# Patient Record
Sex: Male | Born: 1950
Health system: Southern US, Community
[De-identification: ages and names within clinical notes are randomized; demographics above are authoritative.]

## PROBLEM LIST (undated history)

## (undated) DIAGNOSIS — N529 Male erectile dysfunction, unspecified: Secondary | ICD-10-CM

## (undated) DIAGNOSIS — C8590 Non-Hodgkin lymphoma, unspecified, unspecified site: Secondary | ICD-10-CM

## (undated) DIAGNOSIS — K219 Gastro-esophageal reflux disease without esophagitis: Secondary | ICD-10-CM

## (undated) DIAGNOSIS — D649 Anemia, unspecified: Secondary | ICD-10-CM

## (undated) DIAGNOSIS — E119 Type 2 diabetes mellitus without complications: Secondary | ICD-10-CM

## (undated) DIAGNOSIS — I10 Essential (primary) hypertension: Secondary | ICD-10-CM

## (undated) DIAGNOSIS — F329 Major depressive disorder, single episode, unspecified: Secondary | ICD-10-CM

## (undated) DIAGNOSIS — I5189 Other ill-defined heart diseases: Secondary | ICD-10-CM

## (undated) DIAGNOSIS — J449 Chronic obstructive pulmonary disease, unspecified: Secondary | ICD-10-CM

## (undated) DIAGNOSIS — J189 Pneumonia, unspecified organism: Secondary | ICD-10-CM

## (undated) DIAGNOSIS — E785 Hyperlipidemia, unspecified: Secondary | ICD-10-CM

## (undated) DIAGNOSIS — I498 Other specified cardiac arrhythmias: Secondary | ICD-10-CM

## (undated) DIAGNOSIS — Z9889 Other specified postprocedural states: Secondary | ICD-10-CM

## (undated) DIAGNOSIS — Z8669 Personal history of other diseases of the nervous system and sense organs: Secondary | ICD-10-CM

## (undated) DIAGNOSIS — K635 Polyp of colon: Secondary | ICD-10-CM

## (undated) DIAGNOSIS — F32A Depression, unspecified: Secondary | ICD-10-CM

## (undated) HISTORY — DX: Non-Hodgkin lymphoma, unspecified, unspecified site: C85.90

## (undated) HISTORY — DX: Male erectile dysfunction, unspecified: N52.9

## (undated) HISTORY — DX: Other ill-defined heart diseases: I51.89

## (undated) HISTORY — DX: Essential (primary) hypertension: I10

## (undated) HISTORY — DX: Gastro-esophageal reflux disease without esophagitis: K21.9

## (undated) HISTORY — PX: HERNIA REPAIR: SHX51

## (undated) HISTORY — DX: Hyperlipidemia, unspecified: E78.5

## (undated) HISTORY — DX: Depression, unspecified: F32.A

## (undated) HISTORY — DX: Chronic obstructive pulmonary disease, unspecified: J44.9

## (undated) HISTORY — DX: Personal history of other diseases of the nervous system and sense organs: Z86.69

## (undated) HISTORY — DX: Major depressive disorder, single episode, unspecified: F32.9

## (undated) HISTORY — PX: COLONOSCOPY W/ POLYPECTOMY: SHX1380

## (undated) HISTORY — DX: Type 2 diabetes mellitus without complications: E11.9

## (undated) HISTORY — PX: SHOULDER ARTHROSCOPY: SHX128

## (undated) HISTORY — DX: Other specified postprocedural states: Z98.890

## (undated) HISTORY — PX: CHOLECYSTECTOMY: SHX55

## (undated) HISTORY — DX: Polyp of colon: K63.5

---

## 1998-11-16 ENCOUNTER — Emergency Department (HOSPITAL_COMMUNITY): Admission: EM | Admit: 1998-11-16 | Discharge: 1998-11-16 | Payer: Self-pay | Admitting: Emergency Medicine

## 1998-11-16 ENCOUNTER — Encounter: Payer: Self-pay | Admitting: Emergency Medicine

## 1999-10-10 ENCOUNTER — Encounter: Payer: Self-pay | Admitting: Specialist

## 1999-10-10 ENCOUNTER — Ambulatory Visit (HOSPITAL_COMMUNITY): Admission: RE | Admit: 1999-10-10 | Discharge: 1999-10-10 | Payer: Self-pay | Admitting: Specialist

## 2000-01-25 ENCOUNTER — Encounter: Payer: Self-pay | Admitting: Emergency Medicine

## 2000-01-25 ENCOUNTER — Emergency Department (HOSPITAL_COMMUNITY): Admission: EM | Admit: 2000-01-25 | Discharge: 2000-01-25 | Payer: Self-pay | Admitting: Emergency Medicine

## 2001-03-03 ENCOUNTER — Encounter: Payer: Self-pay | Admitting: Emergency Medicine

## 2001-03-03 ENCOUNTER — Emergency Department (HOSPITAL_COMMUNITY): Admission: EM | Admit: 2001-03-03 | Discharge: 2001-03-03 | Payer: Self-pay | Admitting: Emergency Medicine

## 2007-05-06 ENCOUNTER — Ambulatory Visit: Payer: Self-pay | Admitting: *Deleted

## 2007-05-06 ENCOUNTER — Ambulatory Visit: Payer: Self-pay | Admitting: Internal Medicine

## 2007-05-06 LAB — CONVERTED CEMR LAB
CO2: 22 meq/L (ref 19–32)
Calcium: 8.8 mg/dL (ref 8.4–10.5)
Chloride: 98 meq/L (ref 96–112)
Cholesterol: 410 mg/dL — ABNORMAL HIGH (ref 0–200)
Eosinophils Relative: 1 % (ref 0–5)
Glucose, Bld: 355 mg/dL — ABNORMAL HIGH (ref 70–99)
HCT: 48.5 % (ref 39.0–52.0)
Hemoglobin: 16.1 g/dL (ref 13.0–17.0)
Lymphocytes Relative: 28 % (ref 12–46)
Lymphs Abs: 1.6 10*3/uL (ref 0.7–3.3)
Monocytes Absolute: 0.6 10*3/uL (ref 0.2–0.7)
Neutro Abs: 3.4 10*3/uL (ref 1.7–7.7)
Sodium: 135 meq/L (ref 135–145)
Total Bilirubin: 0.6 mg/dL (ref 0.3–1.2)
Total Protein: 7 g/dL (ref 6.0–8.3)
Triglycerides: 1091 mg/dL — ABNORMAL HIGH (ref <150)
WBC: 5.8 10*3/uL (ref 4.0–10.5)

## 2007-05-27 ENCOUNTER — Ambulatory Visit: Payer: Self-pay | Admitting: Internal Medicine

## 2007-06-13 ENCOUNTER — Ambulatory Visit: Payer: Self-pay | Admitting: Internal Medicine

## 2007-07-12 ENCOUNTER — Ambulatory Visit: Payer: Self-pay | Admitting: Internal Medicine

## 2007-07-12 LAB — CONVERTED CEMR LAB
ALT: 13 units/L (ref 0–53)
Albumin: 4.1 g/dL (ref 3.5–5.2)
CO2: 21 meq/L (ref 19–32)
Calcium: 8.9 mg/dL (ref 8.4–10.5)
Chloride: 104 meq/L (ref 96–112)
Cholesterol: 287 mg/dL — ABNORMAL HIGH (ref 0–200)
Glucose, Bld: 189 mg/dL — ABNORMAL HIGH (ref 70–99)
Potassium: 4.4 meq/L (ref 3.5–5.3)
Sodium: 139 meq/L (ref 135–145)
Total Bilirubin: 0.4 mg/dL (ref 0.3–1.2)
Total Protein: 7.3 g/dL (ref 6.0–8.3)
Triglycerides: 524 mg/dL — ABNORMAL HIGH (ref <150)

## 2007-07-18 ENCOUNTER — Ambulatory Visit: Payer: Self-pay | Admitting: Internal Medicine

## 2007-10-18 ENCOUNTER — Encounter (INDEPENDENT_AMBULATORY_CARE_PROVIDER_SITE_OTHER): Payer: Self-pay | Admitting: Family Medicine

## 2007-10-18 ENCOUNTER — Ambulatory Visit: Payer: Self-pay | Admitting: Family Medicine

## 2007-10-18 LAB — CONVERTED CEMR LAB
Albumin: 4.3 g/dL (ref 3.5–5.2)
BUN: 18 mg/dL (ref 6–23)
CO2: 22 meq/L (ref 19–32)
Calcium: 8.9 mg/dL (ref 8.4–10.5)
Chloride: 102 meq/L (ref 96–112)
Cholesterol: 232 mg/dL — ABNORMAL HIGH (ref 0–200)
Creatinine, Ser: 0.99 mg/dL (ref 0.40–1.50)
Glucose, Bld: 257 mg/dL — ABNORMAL HIGH (ref 70–99)
HDL: 31 mg/dL — ABNORMAL LOW (ref >39)
Potassium: 4.8 meq/L (ref 3.5–5.3)
Total CHOL/HDL Ratio: 7.5

## 2008-04-06 ENCOUNTER — Ambulatory Visit: Payer: Self-pay | Admitting: Internal Medicine

## 2008-04-18 ENCOUNTER — Ambulatory Visit: Payer: Self-pay | Admitting: Family Medicine

## 2008-07-04 ENCOUNTER — Ambulatory Visit: Payer: Self-pay | Admitting: Family Medicine

## 2008-07-04 LAB — CONVERTED CEMR LAB
AST: 15 units/L (ref 0–37)
Alkaline Phosphatase: 81 units/L (ref 39–117)
BUN: 17 mg/dL (ref 6–23)
Basophils Relative: 0 % (ref 0–1)
Eosinophils Absolute: 0.4 10*3/uL (ref 0.0–0.7)
Eosinophils Relative: 4 % (ref 0–5)
Glucose, Bld: 190 mg/dL — ABNORMAL HIGH (ref 70–99)
HCT: 48.7 % (ref 39.0–52.0)
HDL: 37 mg/dL — ABNORMAL LOW (ref >39)
LDL Cholesterol: 130 mg/dL — ABNORMAL HIGH (ref 0–99)
Lymphs Abs: 2.7 10*3/uL (ref 0.7–4.0)
MCHC: 32.4 g/dL (ref 30.0–36.0)
MCV: 91.5 fL (ref 78.0–100.0)
Monocytes Relative: 11 % (ref 3–12)
Neutrophils Relative %: 59 % (ref 43–77)
Platelets: 322 10*3/uL (ref 150–400)
Potassium: 4.7 meq/L (ref 3.5–5.3)
Sodium: 139 meq/L (ref 135–145)
TSH: 2.26 microintl units/mL (ref 0.350–4.50)
Total Bilirubin: 0.8 mg/dL (ref 0.3–1.2)
Total CHOL/HDL Ratio: 6
Triglycerides: 272 mg/dL — ABNORMAL HIGH (ref <150)
VLDL: 54 mg/dL — ABNORMAL HIGH (ref 0–40)
WBC: 10.4 10*3/uL (ref 4.0–10.5)

## 2008-07-16 ENCOUNTER — Ambulatory Visit: Payer: Self-pay | Admitting: Internal Medicine

## 2008-07-18 ENCOUNTER — Ambulatory Visit: Payer: Self-pay | Admitting: Internal Medicine

## 2008-09-25 ENCOUNTER — Emergency Department (HOSPITAL_COMMUNITY): Admission: EM | Admit: 2008-09-25 | Discharge: 2008-09-25 | Payer: Self-pay | Admitting: Family Medicine

## 2008-10-15 ENCOUNTER — Ambulatory Visit: Payer: Self-pay | Admitting: Internal Medicine

## 2008-12-10 ENCOUNTER — Ambulatory Visit: Payer: Self-pay | Admitting: Internal Medicine

## 2008-12-17 ENCOUNTER — Ambulatory Visit: Payer: Self-pay | Admitting: Internal Medicine

## 2008-12-28 ENCOUNTER — Ambulatory Visit: Payer: Self-pay | Admitting: Family Medicine

## 2009-02-26 ENCOUNTER — Ambulatory Visit: Payer: Self-pay | Admitting: Family Medicine

## 2009-08-22 ENCOUNTER — Ambulatory Visit: Payer: Self-pay | Admitting: Internal Medicine

## 2009-08-22 LAB — CONVERTED CEMR LAB
AST: 19 units/L (ref 0–37)
Alkaline Phosphatase: 115 units/L (ref 39–117)
Basophils Absolute: 0 10*3/uL (ref 0.0–0.1)
Basophils Relative: 1 % (ref 0–1)
Glucose, Bld: 268 mg/dL — ABNORMAL HIGH (ref 70–99)
Lymphocytes Relative: 22 % (ref 12–46)
MCHC: 33.6 g/dL (ref 30.0–36.0)
Monocytes Absolute: 0.8 10*3/uL (ref 0.1–1.0)
Neutro Abs: 5.1 10*3/uL (ref 1.7–7.7)
Neutrophils Relative %: 65 % (ref 43–77)
Platelets: 287 10*3/uL (ref 150–400)
Pro B Natriuretic peptide (BNP): 9.7 pg/mL (ref 0.0–100.0)
RDW: 12.9 % (ref 11.5–15.5)
Sodium: 139 meq/L (ref 135–145)
Total Bilirubin: 0.4 mg/dL (ref 0.3–1.2)
Total Protein: 6.8 g/dL (ref 6.0–8.3)

## 2009-10-04 ENCOUNTER — Ambulatory Visit (HOSPITAL_COMMUNITY): Admission: RE | Admit: 2009-10-04 | Discharge: 2009-10-04 | Payer: Self-pay | Admitting: Family Medicine

## 2009-12-30 ENCOUNTER — Emergency Department (HOSPITAL_COMMUNITY): Admission: EM | Admit: 2009-12-30 | Discharge: 2009-12-30 | Payer: Self-pay | Admitting: Emergency Medicine

## 2010-03-21 ENCOUNTER — Ambulatory Visit: Payer: Self-pay | Admitting: Family Medicine

## 2013-01-13 DIAGNOSIS — Z9889 Other specified postprocedural states: Secondary | ICD-10-CM

## 2013-01-13 HISTORY — DX: Other specified postprocedural states: Z98.890

## 2013-01-17 ENCOUNTER — Encounter (INDEPENDENT_AMBULATORY_CARE_PROVIDER_SITE_OTHER): Payer: Self-pay | Admitting: Surgery

## 2013-01-18 ENCOUNTER — Encounter (INDEPENDENT_AMBULATORY_CARE_PROVIDER_SITE_OTHER): Payer: Self-pay | Admitting: Surgery

## 2013-01-18 ENCOUNTER — Telehealth: Payer: Self-pay | Admitting: Oncology

## 2013-01-18 ENCOUNTER — Ambulatory Visit (INDEPENDENT_AMBULATORY_CARE_PROVIDER_SITE_OTHER): Payer: Self-pay | Admitting: Surgery

## 2013-01-18 VITALS — BP 108/64 | HR 80 | Temp 97.6°F | Resp 18 | Ht 73.0 in | Wt 259.4 lb

## 2013-01-18 DIAGNOSIS — R161 Splenomegaly, not elsewhere classified: Secondary | ICD-10-CM | POA: Insufficient documentation

## 2013-01-18 NOTE — Progress Notes (Signed)
Patient ID: Micheal Thompson, male   DOB: [birth year], 62 y.o.   MRN: [id number]  Chief Complaint  Patient presents with  . New Evaluation    new pt- eval splenomegaly possible lymphoma    HPI Micheal Thompson is a 62 y.o. male.  Referred by Dr. 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  He denies any constipation but does report some early satiety. He reports a lot of sweating, especially at night, but this does not appear to be any new symptom for the patient. He denies any easy bruising or unexplained weight loss. He was evaluated by Dr. Sherrye Payor who was concerned about a large palpable mass in the left side of his abdomen. He underwent a CT scan which is remarkable for massive splenomegaly as well as multiple enlarged intra-abdominal lymph nodes. These are consistent with probable lymphoma. He was referred to our office today for further evaluation. He has also been referred to oncology for consultation.  Past Medical History  Diagnosis Date  . H/O colonoscopy 01/13/2013  . Hyperlipidemia   . Diabetes mellitus without complication   . Hypertension   . Depression   . H/O Bell's palsy   . Pancarditis   . ED (erectile dysfunction)   . Colon polyps   . COPD (chronic obstructive pulmonary disease)   . GERD (gastroesophageal reflux disease)     Past Surgical History  Procedure Laterality Date  . Hernia repair    . Shoulder arthroscopy    . Cholecystectomy    Open chole in 1983.   Family History  Problem Relation Age of Onset  . Cancer Maternal Grandmother     unknown    Social History History  Substance Use Topics  . Smoking status: Former Smoker    Quit date: 08/11/1983  . Smokeless tobacco: Not on file  . Alcohol Use: No    No Known Allergies  Current  Outpatient Prescriptions  Medication Sig Dispense Refill  . insulin regular (NOVOLIN R,HUMULIN R) 100 units/mL injection Inject 40 Units into the skin 3 (three) times daily before meals.       No current facility-administered medications for this visit.    Review of Systems Review of Systems  Constitutional: Positive for fatigue. Negative for fever, chills and unexpected weight change.  HENT: Negative for hearing loss, congestion, sore throat, trouble swallowing and voice change.   Eyes: Negative for visual disturbance.  Respiratory: Negative for cough and wheezing.   Cardiovascular: Negative for chest pain, palpitations and leg swelling.  Gastrointestinal: Positive for abdominal pain and abdominal distention. Negative for nausea, vomiting, diarrhea, constipation, blood in stool, anal bleeding and rectal pain.  Genitourinary: Positive for flank pain. Negative for hematuria and difficulty urinating.  Musculoskeletal: Negative for arthralgias.  Skin: Negative for rash and wound.  Neurological: Negative for seizures, syncope, weakness and headaches.  Hematological: Negative for adenopathy. Does not bruise/bleed easily.  Psychiatric/Behavioral: Negative for confusion.    Blood pressure 108/64, pulse 80, temperature 97.6 F (36.4 C), temperature source Temporal, resp. rate 18, height 6\' 1"  (1.854 m), weight 259 lb 6.4 oz (117.663 kg).  Physical Exam Physical Exam WDWN in NAD HEENT:  EOMI, sclera anicteric Neck:  No masses, no thyromegaly Lungs:  CTA bilaterally; normal respiratory effort CV:  Regular rate  and rhythm; no murmurs Abd:  +bowel sounds, soft, visible mass in left abdomen; palpable spleen extending down almost to the pelvis No palpable inguinal lymphadenopathy Ext:  Well-perfused; no edema Skin:  Warm, dry; no sign of jaundice  Data Reviewed CT scan Abd/Pelvis with contrast - scanned into EPIC Massive splenomegaly 22x 11.5 x 27 CM.  Normal splenic density.  Numerous  upper abdominal lymph nodes around the GE junction, paragastric, precaval, and porta hepatis regions.  The largest precaval node measures 5.5 x 2.7 cm.  Right paramedian supraumbilical ventral hernia  Amylase 48 Lipase 53 Electrolytes WNL WBC 13.5 Hgb 12.6 Plt 160 LFT's WNL x Alk Phos 277  Assessment    Massive splenomegaly with multiple enlarged intra-abdominal lymph nodes - certainly highly suspicious for malignancy, such as lymphoma or metastatic disease from unknown primary.       Plan    Would recommend obtaining a tissue diagnosis to allow formation of a treatment plan.  Will discuss with interventional radiology regarding the possibility of a core biopsy of one of these enlarged intra-abdominal lymph nodes.  May also require CT chest to evaluate for mediastinal lymphadenopathy.  It is possible that the splenomegaly will improve when his primary malignancy is adequately treated.  We will contact the patient after we have discussed with IR.  He is scheduled to consult with Oncology.         Monesha Monreal K. 01/18/2013, 6:17 PM

## 2013-01-18 NOTE — Telephone Encounter (Signed)
S/W PT IN RE IN NP APPT 06/12 @ 1:30 W.DR.SHADAD REFERRING DR. Waynard Edwards DX- LARGE MASS PALPITATED  IN LU Q SEE CT WELCOME PACKET MAILED.

## 2013-01-18 NOTE — Telephone Encounter (Signed)
C/D 01/18/13 for appt. 01/19/13

## 2013-01-19 ENCOUNTER — Telehealth: Payer: Self-pay | Admitting: Oncology

## 2013-01-19 ENCOUNTER — Other Ambulatory Visit: Payer: Self-pay | Admitting: Lab

## 2013-01-19 ENCOUNTER — Ambulatory Visit: Payer: Self-pay | Admitting: Oncology

## 2013-01-19 ENCOUNTER — Other Ambulatory Visit: Payer: Self-pay | Admitting: Oncology

## 2013-01-19 ENCOUNTER — Ambulatory Visit: Payer: Self-pay

## 2013-01-19 DIAGNOSIS — R161 Splenomegaly, not elsewhere classified: Secondary | ICD-10-CM

## 2013-01-19 NOTE — Telephone Encounter (Signed)
Pt given appt today by Garrel Ridgel

## 2013-01-23 ENCOUNTER — Other Ambulatory Visit (INDEPENDENT_AMBULATORY_CARE_PROVIDER_SITE_OTHER): Payer: Self-pay | Admitting: Surgery

## 2013-01-23 DIAGNOSIS — R161 Splenomegaly, not elsewhere classified: Secondary | ICD-10-CM

## 2013-01-24 ENCOUNTER — Telehealth (INDEPENDENT_AMBULATORY_CARE_PROVIDER_SITE_OTHER): Payer: Self-pay | Admitting: General Surgery

## 2013-01-24 NOTE — Telephone Encounter (Signed)
Left message on machine for patient to call back and ask for me. To make him aware CT chest set up at [facility] [address] location for Friday 01/27/2013 arrive at 8:15 am for 8:30 am appt. To make him aware we are doing CT Chest first to evaluate for a lymph node to biopsy. Awaiting patient call back to make him aware.

## 2013-01-24 NOTE — Telephone Encounter (Signed)
Spoke with patient and he will not have insurance until February 07, 2013. He does not want to pay out of pocket for any more tests. I gave him the information to [facility] to call to r/s his CT chest. I explained the need for the CT chest prior to his biopsy. He stated he did not want to see the oncologist until after his biopsy result because he doesn't want to waste money. He will call [facility] to cancel this appt for now. He will also call [facility] vs [facility] - he may call back for [place] to send order to [facility] if they are cheaper. He will contact us and let us know what he needs.

## 2013-01-24 NOTE — Addendum Note (Signed)
Addended byLiliana Cline on: 01/24/2013 09:27 AM   Modules accepted: Orders

## 2013-01-27 ENCOUNTER — Other Ambulatory Visit: Payer: Self-pay

## 2013-02-02 ENCOUNTER — Encounter (INDEPENDENT_AMBULATORY_CARE_PROVIDER_SITE_OTHER): Payer: Self-pay

## 2013-02-07 ENCOUNTER — Ambulatory Visit (HOSPITAL_BASED_OUTPATIENT_CLINIC_OR_DEPARTMENT_OTHER): Payer: BC Managed Care – PPO | Admitting: Oncology

## 2013-02-07 ENCOUNTER — Other Ambulatory Visit (HOSPITAL_COMMUNITY)
Admission: RE | Admit: 2013-02-07 | Discharge: 2013-02-07 | Disposition: A | Discharge status: Home / Self Care | Payer: BC Managed Care – PPO | Source: Ambulatory Visit | Attending: Oncology | Admitting: Oncology

## 2013-02-07 ENCOUNTER — Other Ambulatory Visit (HOSPITAL_BASED_OUTPATIENT_CLINIC_OR_DEPARTMENT_OTHER): Payer: BC Managed Care – PPO | Admitting: Lab

## 2013-02-07 ENCOUNTER — Encounter: Payer: Self-pay | Admitting: Oncology

## 2013-02-07 ENCOUNTER — Other Ambulatory Visit: Payer: Self-pay | Admitting: Lab

## 2013-02-07 ENCOUNTER — Ambulatory Visit: Payer: Self-pay

## 2013-02-07 ENCOUNTER — Telehealth: Payer: Self-pay | Admitting: Oncology

## 2013-02-07 VITALS — BP 137/66 | HR 83 | Temp 98.1°F | Resp 18 | Ht 73.0 in | Wt 264.0 lb

## 2013-02-07 DIAGNOSIS — R6889 Other general symptoms and signs: Secondary | ICD-10-CM | POA: Insufficient documentation

## 2013-02-07 DIAGNOSIS — R161 Splenomegaly, not elsewhere classified: Secondary | ICD-10-CM

## 2013-02-07 DIAGNOSIS — D72820 Lymphocytosis (symptomatic): Secondary | ICD-10-CM

## 2013-02-07 LAB — COMPREHENSIVE METABOLIC PANEL (CC13)
Albumin: 3.3 g/dL — ABNORMAL LOW (ref 3.5–5.0)
CO2: 22 mEq/L (ref 22–29)
Calcium: 9.1 mg/dL (ref 8.4–10.4)
Glucose: 317 mg/dl — ABNORMAL HIGH (ref 70–140)
Potassium: 4.8 mEq/L (ref 3.5–5.1)
Sodium: 137 mEq/L (ref 136–145)
Total Protein: 7.1 g/dL (ref 6.4–8.3)

## 2013-02-07 LAB — CBC WITH DIFFERENTIAL/PLATELET
Eosinophils Absolute: 0.1 10*3/uL (ref 0.0–0.5)
HGB: 12.8 g/dL — ABNORMAL LOW (ref 13.0–17.1)
LYMPH%: 74.6 % — ABNORMAL HIGH (ref 14.0–49.0)
MONO#: 0.5 10*3/uL (ref 0.1–0.9)
NEUT#: 3.7 10*3/uL (ref 1.5–6.5)
Platelets: 119 10*3/uL — ABNORMAL LOW (ref 140–400)
RBC: 4.25 10*6/uL (ref 4.20–5.82)
RDW: 14.5 % (ref 11.0–14.6)
WBC: 16.8 10*3/uL — ABNORMAL HIGH (ref 4.0–10.3)

## 2013-02-07 LAB — CHCC SMEAR

## 2013-02-07 LAB — TECHNOLOGIST REVIEW

## 2013-02-07 NOTE — Telephone Encounter (Signed)
gv and printed appt sched and avs for pt  °

## 2013-02-07 NOTE — Progress Notes (Signed)
Reason for Referral: Splenomegaly   HPI: Micheal Thompson is a pleasant 62 year old gentleman currently of [place] referred to me for the evaluation of splenomegaly. He is a gentleman with past medical history significant for diabetes, obesity and hyperlipidemia who had noted a chronic right-sided abdominal pain for the last year and a half or so. He attributed that to to his profession as he works as a Pensions consultant and trach patient's and his job is rather physical requires a lot of lifting and moving of heavy equipment and patients. Most recently, he was noticed increased pain in his right side of the abdomen as well as a palpable mass. He got to that to the attention of his primary care doctor ordered a CT scan of the abdomen and pelvis on 01/16/2013. His CT scan showed massive splenomegaly measuring 22 x 11.5 X 27 cm . He also noted extensive upper abdominal lymphadenopathy most noticeably in the precaval region with a 5.5 x 2.7 cm size. Patient evaluated by Dr. Margaree Thompson from Gen. surgery who referred the patient for oncological evaluation. Clinically, Micheal Thompson report symptoms of fatigue, tiredness and some occasional exertional dyspnea. He does report to chronic nagging right upper quadrant pain but did not report any other constitutional symptoms. He had not reported any fevers, chills, night sweats or and did report decrease in his appetite slight weight loss. He stopped working as of late but still able to drive and performance activity of daily living without any hindrance or decline.   Past Medical History  Diagnosis Date  . H/O colonoscopy 01/13/2013  . Hyperlipidemia   . Diabetes mellitus without complication   . Hypertension   . Depression   . H/O Bell's palsy   . Pancarditis   . ED (erectile dysfunction)   . Colon polyps   . COPD (chronic obstructive pulmonary disease)   . GERD (gastroesophageal reflux disease)   :  Past Surgical History  Procedure Laterality Date   . Hernia repair    . Shoulder arthroscopy    . Cholecystectomy    :  Current Outpatient Prescriptions  Medication Sig Dispense Refill  . insulin NPH (HUMULIN N,NOVOLIN N) 100 UNIT/ML injection Inject 40 Units into the skin 2 (two) times daily at 8 am and 10 pm.      . insulin regular (NOVOLIN R,HUMULIN R) 100 units/mL injection Inject into the skin 3 (three) times daily before meals.        No current facility-administered medications for this visit.   : :  No Known Allergies:  Family History  Problem Relation Age of Onset  . Cancer Maternal Grandmother     unknown  :  History   Social History  . Marital Status: Married    Spouse Name: N/A    Number of Children: N/A  . Years of Education: N/A   Occupational History  . Not on file.   Social History Main Topics  . Smoking status: Former Smoker    Quit date: 08/11/1983  . Smokeless tobacco: Not on file  . Alcohol Use: No  . Drug Use: No  . Sexually Active: Not on file   Other Topics Concern  . Not on file   Social History Narrative  . No narrative on file  :  A comprehensive review of systems was negative.  Exam: ECOG 1 Blood pressure 137/66, pulse 83, temperature 98.1 F (36.7 C), temperature source Oral, resp. rate 18, height 6\' 1"  (1.854 m), weight 264 lb (119.75 kg).  General appearance: alert, cooperative and appears stated age Head: Normocephalic, without obvious abnormality, atraumatic Throat: lips, mucosa, and tongue normal; teeth and gums normal Neck: no adenopathy, no carotid bruit, no JVD, supple, symmetrical, trachea midline and thyroid not enlarged, symmetric, no tenderness/mass/nodules Back: symmetric, no curvature. ROM normal. No CVA tenderness. Resp: clear to auscultation bilaterally Chest wall: no tenderness Cardio: regular rate and rhythm, S1, S2 normal, no murmur, click, rub or gallop GI: abnormal findings:  Splenomegaly noted.  Extremities: extremities normal, atraumatic, no cyanosis or  edema Pulses: 2+ and symmetric Skin: Skin color, texture, turgor normal. No rashes or lesions Lymph nodes: Cervical, supraclavicular, and axillary nodes normal.   Recent Labs  02/07/13 1042  WBC 16.8*  HGB 12.8*  HCT 38.4  PLT 119*    Recent Labs  02/07/13 1042  NA 137  K 4.8  CO2 22  GLUCOSE 317 Repeated and Verified*  BUN 21.4  CREATININE 1.1  CALCIUM 9.1     Blood smear review: Peripheral smear was personally reviewed today and showed evidence of increased lymphocytes that were clearly abnormal appearing. There appeared enlarged and some have cytoplasmic projections. Slightly decreased platelets but otherwise normal in appearance the red cells appeared normal as well.    Assessment and Plan:   62 year old gentleman with a recent finding of massive splenomegaly, abdominal adenopathy and lymphocytosis on his blood work. The differential diagnosis was discussed today with Micheal Thompson and undoubtedly he has a lymphoproliferative disorder most likely low-grade non-Hodgkin's lymphoma. These conditions would include lymphoma such as splenic marginal zone lymphoma, hairy cell leukemia and less likely small lymphocytic leukemia. To work this up completely, he is scheduled to have a CT scan of the chest to identify any further lymphadenopathy but ultimately he will need a biopsy. I do agree that biopsying the spleen is a rather risky and unnecessary procedure at this time as well as biopsying his and abdominal adenopathy my also be difficult. Given the fact that he will require a bone marrow biopsy in any case for staging, up that we started with that and hopefully that will give [place] the diagnosis and we'll spare him any further biopsies. I will also send his blood for flow cytometry and I hope that will also aid in making the diagnosis. From a management standpoint, once we have identified a type of lymphoma will initiate treatment as soon as possible and that should help with the symptoms  including shrinking his lymphadenopathy as well as his splenomegaly. I probably will help with his fatigue and tiredness as well. He understands the treatment will probably include multiagent systemic chemotherapy but the exact kind and schedule will be determined but his type of multiple I will schedule him adequate followup after the results of his flow cytometry and bone marrow biopsy have been concluded. All his questions were answered today.

## 2013-02-07 NOTE — Progress Notes (Signed)
Checked in new pt with no financial concerns. °

## 2013-02-09 ENCOUNTER — Encounter (HOSPITAL_COMMUNITY): Payer: Self-pay | Admitting: Pharmacy Technician

## 2013-02-09 ENCOUNTER — Other Ambulatory Visit: Payer: Self-pay | Admitting: Radiology

## 2013-02-15 ENCOUNTER — Ambulatory Visit (HOSPITAL_COMMUNITY)
Admission: RE | Admit: 2013-02-15 | Discharge: 2013-02-15 | Disposition: A | Discharge status: Home / Self Care | Payer: BC Managed Care – PPO | Source: Ambulatory Visit | Attending: Oncology | Admitting: Oncology

## 2013-02-15 ENCOUNTER — Encounter (HOSPITAL_COMMUNITY): Payer: Self-pay

## 2013-02-15 DIAGNOSIS — R161 Splenomegaly, not elsewhere classified: Secondary | ICD-10-CM

## 2013-02-15 DIAGNOSIS — E785 Hyperlipidemia, unspecified: Secondary | ICD-10-CM | POA: Insufficient documentation

## 2013-02-15 DIAGNOSIS — K219 Gastro-esophageal reflux disease without esophagitis: Secondary | ICD-10-CM | POA: Insufficient documentation

## 2013-02-15 DIAGNOSIS — D649 Anemia, unspecified: Secondary | ICD-10-CM | POA: Insufficient documentation

## 2013-02-15 DIAGNOSIS — C8589 Other specified types of non-Hodgkin lymphoma, extranodal and solid organ sites: Secondary | ICD-10-CM | POA: Insufficient documentation

## 2013-02-15 DIAGNOSIS — J449 Chronic obstructive pulmonary disease, unspecified: Secondary | ICD-10-CM | POA: Insufficient documentation

## 2013-02-15 DIAGNOSIS — E119 Type 2 diabetes mellitus without complications: Secondary | ICD-10-CM | POA: Insufficient documentation

## 2013-02-15 DIAGNOSIS — I10 Essential (primary) hypertension: Secondary | ICD-10-CM | POA: Insufficient documentation

## 2013-02-15 DIAGNOSIS — Z79899 Other long term (current) drug therapy: Secondary | ICD-10-CM | POA: Insufficient documentation

## 2013-02-15 DIAGNOSIS — J4489 Other specified chronic obstructive pulmonary disease: Secondary | ICD-10-CM | POA: Insufficient documentation

## 2013-02-15 DIAGNOSIS — D72829 Elevated white blood cell count, unspecified: Secondary | ICD-10-CM | POA: Insufficient documentation

## 2013-02-15 DIAGNOSIS — D72820 Lymphocytosis (symptomatic): Secondary | ICD-10-CM | POA: Insufficient documentation

## 2013-02-15 LAB — CBC
MCHC: 32.8 g/dL (ref 30.0–36.0)
Platelets: 192 10*3/uL (ref 150–400)
RDW: 13.3 % (ref 11.5–15.5)
WBC: 33.1 10*3/uL — ABNORMAL HIGH (ref 4.0–10.5)

## 2013-02-15 LAB — APTT: aPTT: 28 seconds (ref 24–37)

## 2013-02-15 LAB — PROTIME-INR
INR: 1.03 (ref 0.00–1.49)
Prothrombin Time: 13.3 seconds (ref 11.6–15.2)

## 2013-02-15 LAB — GLUCOSE, CAPILLARY: Glucose-Capillary: 252 mg/dL — ABNORMAL HIGH (ref 70–99)

## 2013-02-15 MED ORDER — INSULIN ASPART 100 UNIT/ML ~~LOC~~ SOLN
0.0000 [IU] | Freq: Three times a day (TID) | SUBCUTANEOUS | Status: DC
  Administered 2013-02-15: 5 [IU] via SUBCUTANEOUS
  Filled 2013-02-15: qty 1

## 2013-02-15 MED ORDER — SODIUM CHLORIDE 0.9 % IV SOLN
Freq: Once | INTRAVENOUS | Status: AC
  Administered 2013-02-15: 20 mL/h via INTRAVENOUS

## 2013-02-15 MED ORDER — FENTANYL CITRATE 0.05 MG/ML IJ SOLN
INTRAMUSCULAR | Status: AC
  Filled 2013-02-15: qty 4

## 2013-02-15 MED ORDER — MIDAZOLAM HCL 2 MG/2ML IJ SOLN
INTRAMUSCULAR | Status: AC
  Filled 2013-02-15: qty 4

## 2013-02-15 MED ORDER — MIDAZOLAM HCL 2 MG/2ML IJ SOLN
INTRAMUSCULAR | Status: AC | PRN
  Administered 2013-02-15 (×2): 2 mg via INTRAVENOUS

## 2013-02-15 MED ORDER — FENTANYL CITRATE 0.05 MG/ML IJ SOLN
INTRAMUSCULAR | Status: AC | PRN
  Administered 2013-02-15: 100 ug via INTRAVENOUS

## 2013-02-15 NOTE — H&P (Signed)
Chief Complaint: "I'm here for bone marrow biopsy" Referring Physician:Shadad HPI: Micheal Thompson is an 62 y.o. male with splenomegaly and lymphomcytosis. He is undergoing workup and is scheduled for bone marrow biopsy. They are hoping to avoid any intervention/biopsy of the spleen. He has been having sweats/low grade fevers for the past few weeks. PMHx and meds reviewed.  Past Medical History:  Past Medical History  Diagnosis Date  . H/O colonoscopy 01/13/2013  . Hyperlipidemia   . Diabetes mellitus without complication   . Hypertension   . Depression   . H/O Bell's palsy   . Pancarditis   . ED (erectile dysfunction)   . Colon polyps   . COPD (chronic obstructive pulmonary disease)   . GERD (gastroesophageal reflux disease)     Past Surgical History:  Past Surgical History  Procedure Laterality Date  . Hernia repair    . Shoulder arthroscopy    . Cholecystectomy      Family History:  Family History  Problem Relation Age of Onset  . Cancer Maternal Grandmother     unknown    Social History:  reports that he quit smoking about 29 years ago. He does not have any smokeless tobacco history on file. He reports that he does not drink alcohol or use illicit drugs.  Allergies:  Allergies  Allergen Reactions  . Penicillins Other (See Comments)    unknown    Medications: docusate sodium (COLACE) 100 MG capsule (Taking) Sig - Route: Take 100 mg by mouth daily. - Oral Class: Historical Med HYDROcodone-acetaminophen (NORCO/VICODIN) 5-325 MG per tablet (Taking) Sig - Route: Take 1 tablet by mouth every 6 (six) hours as needed for pain. - Oral Class: Historical Med insulin NPH (HUMULIN N,NOVOLIN N) 100 UNIT/ML injection (Taking) Sig - Route: Inject 40 Units into the skin 2 (two) times daily at 8 am and 10 pm. - Subcutaneous Class: Historical Med Number of times this order has been changed since signing: 1 Order Audit Trail insulin regular (NOVOLIN R,HUMULIN R) 100 units/mL injection  (Taking) Sig - Route: Inject 5-30 Units into the skin 3 (three) times daily before meals. Sliding scale 81-120= 5 units, 121-140= 7 units, 141-180-=10 units, 181-240=12 units, 241-280=15 unites, 281-340=22 units, 341-400=25 units, >400=30 units - Subcutaneous Class: Historical Med Number of times this order has been changed since signing: 3 Order Audit Trail Multiple Vitamin (MULTIVITAMIN WITH MINERALS) TABS (Taking) Sig - Route: Take 1 tablet by mouth daily. - Oral Class: Historical Med polyethylene glycol (MIRALAX / GLYCOLAX) packet (Taking) Sig - Route: Take 17 g by mouth daily. - Oral Class: Historical Med sertraline (ZOLOFT) 50 MG tablet (Taking) Sig - Route: Take 12.5-50 mg by mouth daily at 12 noon. Pt takes a 12.5 mg for 4 days, 25 mg for 4 days, then 50 from there - Oral    Please HPI for pertinent positives, otherwise complete 10 system ROS negative.  Physical Exam: Temp: 98.2, HR: 104, RR: 18, BP: 141/75   General Appearance:  Alert, cooperative, no distress, appears stated age  Head:  Normocephalic, without obvious abnormality, atraumatic  ENT: Unremarkable  Neck: Supple, symmetrical, trachea midline  Lungs:   Clear to auscultation bilaterally, no w/r/r, respirations unlabored without use of accessory muscles.  Chest Wall:  No tenderness or deformity  Heart:  Regular rate and rhythm, S1, S2 normal, no murmur, rub or gallop.  Neurologic: Normal affect, no gross deficits.   Results for orders placed during the hospital encounter of 02/15/13 (from the past 48  hour(s))  APTT     Status: None   Collection Time    02/15/13  7:00 AM      Result Value Range   aPTT 28  24 - 37 seconds  CBC     Status: Abnormal   Collection Time    02/15/13  7:00 AM      Result Value Range   WBC 33.1 (*) 4.0 - 10.5 K/uL   RBC 4.36  4.22 - 5.81 MIL/uL   Hemoglobin 12.7 (*) 13.0 - 17.0 g/dL   HCT 62.9 (*) 52.8 - 41.3 %   MCV 88.8  78.0 - 100.0 fL   MCH 29.1  26.0 - 34.0 pg   MCHC 32.8  30.0 - 36.0  g/dL   RDW 24.4  01.0 - 27.2 %   Platelets 192  150 - 400 K/uL  PROTIME-INR     Status: None   Collection Time    02/15/13  7:00 AM      Result Value Range   Prothrombin Time 13.3  11.6 - 15.2 seconds   INR 1.03  0.00 - 1.49   No results found.  Assessment/Plan Splenomegaly and lymphocytosis c/w lymphoproliferative disorder of uncertain etiology at this point. Discussed CT guided BM biopsy. Explained procedure, risks, complications, use of sedation Labs reviewed, ok Consent signed in chart Elevated CBG at 252, will check again after procedure and give insulin based on that level.  Brayton El PA-C 02/15/2013, 8:32 AM

## 2013-02-15 NOTE — Progress Notes (Addendum)
Pt c/o lower back pain post bone marrow biopsy.  Describes as sharp with some movement.  Dressing dry and intact and no swelling noted.  Offered to call MD in radiology for pain med.  Pt declined and states he has meds at home and doesn't want pain med here.  Asked pt twice if I could please call for a pain med and pt declined again.  Informed pt to please call nurse if he changes his mind while waiting for d/c.  Pt voiced understanding. Pt states the pain eases off when he gets still again.  Pain occurs only with certain movements,pt states.

## 2013-02-15 NOTE — Procedures (Signed)
Procedure:  CT guided bone marrow biopsy Findings:  Right iliac bone marrow aspirate and core biopsy performed. 

## 2013-02-15 NOTE — H&P (Signed)
Agree 

## 2013-02-21 LAB — TISSUE HYBRIDIZATION (BONE MARROW)-NCBH

## 2013-02-22 ENCOUNTER — Telehealth: Payer: Self-pay | Admitting: Oncology

## 2013-02-22 ENCOUNTER — Telehealth: Payer: Self-pay | Admitting: *Deleted

## 2013-02-22 ENCOUNTER — Ambulatory Visit (HOSPITAL_BASED_OUTPATIENT_CLINIC_OR_DEPARTMENT_OTHER): Payer: BC Managed Care – PPO | Admitting: Oncology

## 2013-02-22 VITALS — BP 124/70 | HR 81 | Temp 97.2°F | Resp 18 | Ht 73.0 in | Wt 255.4 lb

## 2013-02-22 DIAGNOSIS — C8319 Mantle cell lymphoma, extranodal and solid organ sites: Secondary | ICD-10-CM

## 2013-02-22 DIAGNOSIS — R161 Splenomegaly, not elsewhere classified: Secondary | ICD-10-CM

## 2013-02-22 DIAGNOSIS — C8310 Mantle cell lymphoma, unspecified site: Secondary | ICD-10-CM

## 2013-02-22 MED ORDER — PROMETHAZINE HCL 25 MG PO TABS: 25.0000 mg | ORAL_TABLET | Freq: Four times a day (QID) | ORAL | Status: DC | PRN

## 2013-02-22 NOTE — Telephone Encounter (Signed)
Gave pt appt for lab, chemo for July , needs MD appt before chemo on 8/19 waiting for MD

## 2013-02-22 NOTE — Telephone Encounter (Signed)
Per staff phone call and POF I have schedueld appts.  JMW  

## 2013-02-22 NOTE — Progress Notes (Signed)
Hematology and Oncology Follow Up Visit  Micheal Thompson [id number] 09-20-1950 61 y.o. 02/22/2013 10:12 AM   Principle Diagnosis: 62 year old gentleman  presented with splenomegaly and lymphadenopathy. His workup is ongoing but most likely we are dealing with a mantle cell lymphoma diagnosed in July of 2014.  Current therapy: Under consideration to start systemic therapy.  Interim History: Micheal Thompson presents today for a followup visit. He is a pleasant 62 year old gentleman I saw for the first time on 02/07/2013 evaluation of splenomegaly and lymphadenopathy. Since his last evaluation, his peripheral blood flow cytometry showed monoclonal B-cell population that coexpressed CD5, CD20, CD20, CD22 and HLA-DR with kappa light chain restriction suggesting the presence of mantle cell lymphoma. He underwent a bone marrow biopsy which showed hypercellular bone marrow with involvement of non-Hodgkin's B-cell lymphoma with the molecular studies and FISH suggesting mantle cell lymphoma. Clinically, he is reporting more fatigue and tiredness. Does report some abdominal pain and early satiety. He also reports weight loss close to 10 pounds since the last visit. I reporting any fevers or chills or night sweats at this time.  Medications: I have reviewed the patient's current medications.  Current Outpatient Prescriptions  Medication Sig Dispense Refill  . docusate sodium (COLACE) 100 MG capsule Take 100 mg by mouth daily.      Marland Kitchen HYDROcodone-acetaminophen (NORCO/VICODIN) 5-325 MG per tablet Take 1 tablet by mouth every 6 (six) hours as needed for pain.      Marland Kitchen insulin NPH (HUMULIN N,NOVOLIN N) 100 UNIT/ML injection Inject 40 Units into the skin 2 (two) times daily at 8 am and 10 pm.      . insulin regular (NOVOLIN R,HUMULIN R) 100 units/mL injection Inject 5-30 Units into the skin 3 (three) times daily before meals. Sliding scale 81-120= 5 units, 121-140= 7 units, 141-180-=10 units, 181-240=12 units, 241-280=15  unites, 281-340=22 units, 341-400=25 units, >400=30 units      . Multiple Vitamin (MULTIVITAMIN WITH MINERALS) TABS Take 1 tablet by mouth daily.      . polyethylene glycol (MIRALAX / GLYCOLAX) packet Take 17 g by mouth daily.      . promethazine (PHENERGAN) 25 MG tablet Take 1 tablet (25 mg total) by mouth every 6 (six) hours as needed for nausea.  30 tablet  0  . sertraline (ZOLOFT) 50 MG tablet Take 12.5-50 mg by mouth daily at 12 noon. Pt takes a 12.5 mg for 4 days, 25 mg for 4 days, then 50 from there       No current facility-administered medications for this visit.     Allergies:  Allergies  Allergen Reactions  . Penicillins Other (See Comments)    unknown    Past Medical History, Surgical history, Social history, and Family History were reviewed and updated.  Review of Systems: Constitutional:  Negative for fever, chills, night sweats, anorexia, weight loss, pain. Cardiovascular: no chest pain or dyspnea on exertion Respiratory: negative Neurological: negative Dermatological: negative ENT: negative Skin: Negative. Gastrointestinal: negative Genito-Urinary: negative Hematological and Lymphatic: negative Breast: negative Musculoskeletal: negative Remaining ROS negative. Physical Exam: Blood pressure 124/70, pulse 81, temperature 97.2 F (36.2 C), temperature source Oral, resp. rate 18, height 6\' 1"  (1.854 m), weight 255 lb 6.4 oz (115.849 kg). ECOG:  General appearance: alert Head: Normocephalic, without obvious abnormality, atraumatic Neck: no adenopathy, no carotid bruit, no JVD, supple, symmetrical, trachea midline and thyroid not enlarged, symmetric, no tenderness/mass/nodules Lymph nodes: Cervical, supraclavicular, and axillary nodes normal. Heart:regular rate and rhythm, S1, S2 normal, no murmur,  click, rub or gallop Lung:chest clear, no wheezing, rales, normal symmetric air entry Abdomin: soft, non-tender, without masses or organomegaly and Plapable  splenomegaly. Soft otherwise.  EXT:no erythema, induration, or nodules   Lab Results: Lab Results  Component Value Date   WBC 33.1* 02/15/2013   HGB 12.7* 02/15/2013   HCT 38.7* 02/15/2013   MCV 88.8 02/15/2013   PLT 192 02/15/2013     Chemistry      Component Value Date/Time   NA 137 02/07/2013 1042   NA 139 08/22/2009 2117   K 4.8 02/07/2013 1042   K 4.2 08/22/2009 2117   CL 102 08/22/2009 2117   CO2 22 02/07/2013 1042   CO2 23 08/22/2009 2117   BUN 21.4 02/07/2013 1042   BUN 15 08/22/2009 2117   CREATININE 1.1 02/07/2013 1042   CREATININE 1.11 08/22/2009 2117      Component Value Date/Time   CALCIUM 9.1 02/07/2013 1042   CALCIUM 8.9 08/22/2009 2117   ALKPHOS 319 Repeated and Verified* 02/07/2013 1042   ALKPHOS 115 08/22/2009 2117   AST 19 02/07/2013 1042   AST 19 08/22/2009 2117   ALT 11 02/07/2013 1042   ALT 20 08/22/2009 2117   BILITOT 0.77 02/07/2013 1042   BILITOT 0.4 08/22/2009 2117        Impression and Plan:  62 year old gentleman with the following issues:  1. Diagnosis of non-Hodgkin's lymphoma presented with massive splenomegaly and extensive upper abdominal lymphadenopathy with a diagnosis confirmed in July of 2014. The subtype of this lymphoma is more suggestive of mantle cell been any other type. I had a lengthy discussion with Micheal Thompson about the natural course of non-Hodgkin's lymphoma more specifically mantle cell. I felt that he is in reasonable health and shape could be potentially stem cell transplant candidate if we can get his disease into remission. Review of the literature today with Micheal Thompson have suggested the use of multiagent chemotherapy with oh will be an aggressive regimen such as Hyper CVAD versus milder regimens such as bendamustine and rituximab really have yield similar outcomes. For that reason, we'll prefer to proceed with bendamustine and rituximab and if he gets into a remission that we will consider consolidation with autologous stem cell transplant.  Risks and  benefits of systemic chemotherapy were discussed today in detail. Complications that includes nausea, vomiting, myelosuppression, pancytopenia, need for growth factor support, possible need for blood product transfusion, and rarely need for hospitalization for intravenous antibiotics due to neutropenic sepsis. I've also discussed with him complications of infusion-related toxicity associated with rituximab. These would include hives, pruritus and more readily anaphylaxis.  I also discussed with him the logistics of a Port-A-Cath administration which we will defer for the time being given the fact that he has excellent veins and we will consider that in the future if needed to. I have given him prescription for Phenergan and also referred him for chemotherapy education class. All his questions were answered today regarding the diagnosis, prognosis and treatment.  2. Followup: Anticipate the start of chemotherapy on July 22 and he will be followed before each cycle of therapy probably will need at least 6 months of chemotherapy.  [facility], MD 7/16/201410:12 AM

## 2013-02-23 ENCOUNTER — Other Ambulatory Visit: Payer: Self-pay

## 2013-02-23 ENCOUNTER — Encounter: Payer: Self-pay | Admitting: *Deleted

## 2013-02-23 ENCOUNTER — Encounter: Payer: Self-pay | Admitting: Oncology

## 2013-02-23 NOTE — Progress Notes (Signed)
Checked in patient for chemo education. He spoke with Lenise__ This pt has [employer] with the Affordable Care Act effective July 1st but he forgot his card today.  Once he brings his card I will enter it in the system and have billing resubmit bills.

## 2013-02-27 ENCOUNTER — Other Ambulatory Visit (HOSPITAL_BASED_OUTPATIENT_CLINIC_OR_DEPARTMENT_OTHER): Payer: BC Managed Care – PPO

## 2013-02-27 DIAGNOSIS — C8310 Mantle cell lymphoma, unspecified site: Secondary | ICD-10-CM

## 2013-02-27 DIAGNOSIS — R161 Splenomegaly, not elsewhere classified: Secondary | ICD-10-CM

## 2013-02-27 DIAGNOSIS — C8319 Mantle cell lymphoma, extranodal and solid organ sites: Secondary | ICD-10-CM

## 2013-02-27 LAB — CBC WITH DIFFERENTIAL/PLATELET
Eosinophils Absolute: 0.1 10*3/uL (ref 0.0–0.5)
MONO#: 0.4 10*3/uL (ref 0.1–0.9)
MONO%: 1.9 % (ref 0.0–14.0)
NEUT#: 3 10*3/uL (ref 1.5–6.5)
RBC: 4.26 10*6/uL (ref 4.20–5.82)
RDW: 13.8 % (ref 11.0–14.6)
WBC: 20.7 10*3/uL — ABNORMAL HIGH (ref 4.0–10.3)
lymph#: 17.2 10*3/uL — ABNORMAL HIGH (ref 0.9–3.3)
nRBC: 0 % (ref 0–0)

## 2013-02-27 LAB — COMPREHENSIVE METABOLIC PANEL (CC13)
ALT: 22 U/L (ref 0–55)
CO2: 24 mEq/L (ref 22–29)
Sodium: 134 mEq/L — ABNORMAL LOW (ref 136–145)
Total Bilirubin: 0.43 mg/dL (ref 0.20–1.20)
Total Protein: 6.7 g/dL (ref 6.4–8.3)

## 2013-02-28 ENCOUNTER — Ambulatory Visit (HOSPITAL_BASED_OUTPATIENT_CLINIC_OR_DEPARTMENT_OTHER): Payer: BC Managed Care – PPO

## 2013-02-28 VITALS — BP 150/75 | HR 94 | Temp 99.3°F | Resp 22

## 2013-02-28 DIAGNOSIS — R161 Splenomegaly, not elsewhere classified: Secondary | ICD-10-CM

## 2013-02-28 DIAGNOSIS — Z5112 Encounter for antineoplastic immunotherapy: Secondary | ICD-10-CM

## 2013-02-28 DIAGNOSIS — Z5111 Encounter for antineoplastic chemotherapy: Secondary | ICD-10-CM

## 2013-02-28 DIAGNOSIS — C8589 Other specified types of non-Hodgkin lymphoma, extranodal and solid organ sites: Secondary | ICD-10-CM

## 2013-02-28 MED ORDER — DEXAMETHASONE SODIUM PHOSPHATE 10 MG/ML IJ SOLN
10.0000 mg | Freq: Once | INTRAMUSCULAR | Status: AC
  Administered 2013-02-28: 10 mg via INTRAVENOUS

## 2013-02-28 MED ORDER — DIPHENHYDRAMINE HCL 25 MG PO CAPS
50.0000 mg | ORAL_CAPSULE | Freq: Once | ORAL | Status: AC
  Administered 2013-02-28: 50 mg via ORAL

## 2013-02-28 MED ORDER — METHYLPREDNISOLONE SODIUM SUCC 125 MG IJ SOLR
125.0000 mg | Freq: Once | INTRAMUSCULAR | Status: AC | PRN
  Administered 2013-02-28: 125 mg via INTRAVENOUS

## 2013-02-28 MED ORDER — RITUXIMAB CHEMO INJECTION 10 MG/ML
375.0000 mg/m2 | Freq: Once | INTRAVENOUS | Status: AC
  Administered 2013-02-28: 900 mg via INTRAVENOUS
  Filled 2013-02-28: qty 90

## 2013-02-28 MED ORDER — HYDROCODONE-ACETAMINOPHEN 5-325 MG PO TABS
2.0000 | ORAL_TABLET | Freq: Once | ORAL | Status: AC
  Administered 2013-02-28: 2 via ORAL

## 2013-02-28 MED ORDER — ACETAMINOPHEN 325 MG PO TABS
650.0000 mg | ORAL_TABLET | Freq: Once | ORAL | Status: AC
  Administered 2013-02-28: 650 mg via ORAL

## 2013-02-28 MED ORDER — DIPHENHYDRAMINE HCL 50 MG/ML IJ SOLN
25.0000 mg | Freq: Once | INTRAMUSCULAR | Status: AC | PRN
  Administered 2013-02-28: 25 mg via INTRAVENOUS

## 2013-02-28 MED ORDER — SODIUM CHLORIDE 0.9 % IV SOLN
90.0000 mg/m2 | Freq: Once | INTRAVENOUS | Status: AC
  Administered 2013-02-28: 220 mg via INTRAVENOUS
  Filled 2013-02-28: qty 44

## 2013-02-28 MED ORDER — SODIUM CHLORIDE 0.9 % IV SOLN
Freq: Once | INTRAVENOUS | Status: AC
  Administered 2013-02-28: 09:00:00 via INTRAVENOUS

## 2013-02-28 MED ORDER — ONDANSETRON 8 MG/50ML IVPB (CHCC)
8.0000 mg | Freq: Once | INTRAVENOUS | Status: AC
  Administered 2013-02-28: 8 mg via INTRAVENOUS

## 2013-02-28 NOTE — Patient Instructions (Addendum)
[facility] Discharge Instructions for Patients Receiving Chemotherapy  Today you received the following chemotherapy agents:  Rituxan and Treanda  To help prevent nausea and vomiting after your treatment, we encourage you to take your nausea medication as ordered per MD.   If you develop nausea and vomiting that is not controlled by your nausea medication, call the clinic.   BELOW ARE SYMPTOMS THAT SHOULD BE REPORTED IMMEDIATELY:  *FEVER GREATER THAN 100.5 F  *CHILLS WITH OR WITHOUT FEVER  NAUSEA AND VOMITING THAT IS NOT CONTROLLED WITH YOUR NAUSEA MEDICATION  *UNUSUAL SHORTNESS OF BREATH  *UNUSUAL BRUISING OR BLEEDING  TENDERNESS IN MOUTH AND THROAT WITH OR WITHOUT PRESENCE OF ULCERS  *URINARY PROBLEMS  *BOWEL PROBLEMS  UNUSUAL RASH Items with * indicate a potential emergency and should be followed up as soon as possible.  Feel free to call the clinic you have any questions or concerns. The clinic phone number is [phone].    

## 2013-02-28 NOTE — Progress Notes (Signed)
0935-Pt c/o tightness in chest, SOB and is diaphoretic.  Rituxan stopped.  Dr. Clelia Croft to infusion room and Solumedrol 125 mg. IV given at 0940 as ordered per MD.  Order to hold Rituxan for 30 minutes and restart if pt.'s symptoms resolved.  1010-Rituxan restarted at 50mg /hr.  Pt verbalizes an understanding to notify RN if any further complaints or symptoms.     1345-Pt having itching and red raised rash to face, back and chest.  Rituxan stopped.  Dr. Clelia Croft notified and order received to give Benadryl 25 mg. IV.  Benadryl given at 1350 as ordered.  Per Dr. Clelia Croft, wait 10 minutes and if itching is resolved.  Continue Rituxan at previous rate.    1415-Pt now without complaints of itching and rash has cleared.  Rituxan restarted at 154 ml/hr.  1445-Rituxan complete and pt without complaints.  1458-Pt c/o "aching pain, 6 out of 10" to abdomen and requesting something for pain.  Dr. Clelia Croft notified and order received to give two Hydro/APAP 5/325 tabs now.  1530-Pt states that pain to abdomen is now at a "3" and he is comfortable with that number. Pt requests no further pain intervention at this time.

## 2013-03-01 ENCOUNTER — Ambulatory Visit (HOSPITAL_BASED_OUTPATIENT_CLINIC_OR_DEPARTMENT_OTHER): Payer: BC Managed Care – PPO

## 2013-03-01 VITALS — BP 126/64 | HR 68 | Temp 97.6°F | Resp 18

## 2013-03-01 DIAGNOSIS — R161 Splenomegaly, not elsewhere classified: Secondary | ICD-10-CM

## 2013-03-01 DIAGNOSIS — C8589 Other specified types of non-Hodgkin lymphoma, extranodal and solid organ sites: Secondary | ICD-10-CM

## 2013-03-01 DIAGNOSIS — Z5111 Encounter for antineoplastic chemotherapy: Secondary | ICD-10-CM

## 2013-03-01 MED ORDER — SODIUM CHLORIDE 0.9 % IV SOLN
90.0000 mg/m2 | Freq: Once | INTRAVENOUS | Status: AC
  Administered 2013-03-01: 220 mg via INTRAVENOUS
  Filled 2013-03-01: qty 44

## 2013-03-01 MED ORDER — ONDANSETRON 8 MG/50ML IVPB (CHCC)
8.0000 mg | Freq: Once | INTRAVENOUS | Status: AC
  Administered 2013-03-01: 8 mg via INTRAVENOUS

## 2013-03-01 MED ORDER — DEXAMETHASONE SODIUM PHOSPHATE 10 MG/ML IJ SOLN
10.0000 mg | Freq: Once | INTRAMUSCULAR | Status: AC
  Administered 2013-03-01: 10 mg via INTRAVENOUS

## 2013-03-01 MED ORDER — SODIUM CHLORIDE 0.9 % IV SOLN
Freq: Once | INTRAVENOUS | Status: AC
  Administered 2013-03-01: 10:00:00 via INTRAVENOUS

## 2013-03-01 NOTE — Patient Instructions (Addendum)
[facility] Discharge Instructions for Patients Receiving Chemotherapy  Today you received the following chemotherapy agents: Treanda  To help prevent nausea and vomiting after your treatment, we encourage you to take your nausea medication as directed by your physician.   If you develop nausea and vomiting that is not controlled by your nausea medication, call the clinic.   BELOW ARE SYMPTOMS THAT SHOULD BE REPORTED IMMEDIATELY:  *FEVER GREATER THAN 100.5 F  *CHILLS WITH OR WITHOUT FEVER  NAUSEA AND VOMITING THAT IS NOT CONTROLLED WITH YOUR NAUSEA MEDICATION  *UNUSUAL SHORTNESS OF BREATH  *UNUSUAL BRUISING OR BLEEDING  TENDERNESS IN MOUTH AND THROAT WITH OR WITHOUT PRESENCE OF ULCERS  *URINARY PROBLEMS  *BOWEL PROBLEMS  UNUSUAL RASH Items with * indicate a potential emergency and should be followed up as soon as possible.  Feel free to call the clinic you have any questions or concerns. The clinic phone number is [phone].    

## 2013-03-13 ENCOUNTER — Telehealth (INDEPENDENT_AMBULATORY_CARE_PROVIDER_SITE_OTHER): Payer: Self-pay | Admitting: General Surgery

## 2013-03-13 NOTE — Telephone Encounter (Signed)
Patient financial is waiting on insurance for CT Chest w contrast. Dr Clelia Croft is seeing the patient

## 2013-03-14 ENCOUNTER — Telehealth: Payer: Self-pay | Admitting: *Deleted

## 2013-03-14 NOTE — Telephone Encounter (Signed)
Patient called reporting yesterday he noticed a section of the roof of his mouth is tender.  "I don't know if I ate something hot or what."  Denies red or white spots on tongue, trouble swallowing or eating.  Received rituxan/ treanda on 03-01-2013.  Next cycle due 03-28-2013.  Currently using Biotene products.  This nurse instructed him to use salt and baking soda to rinse after meals.

## 2013-03-28 ENCOUNTER — Other Ambulatory Visit: Payer: Self-pay | Admitting: Oncology

## 2013-03-28 ENCOUNTER — Telehealth: Payer: Self-pay | Admitting: Oncology

## 2013-03-28 ENCOUNTER — Ambulatory Visit (HOSPITAL_BASED_OUTPATIENT_CLINIC_OR_DEPARTMENT_OTHER): Payer: BC Managed Care – PPO | Admitting: Oncology

## 2013-03-28 ENCOUNTER — Telehealth: Payer: Self-pay | Admitting: *Deleted

## 2013-03-28 ENCOUNTER — Other Ambulatory Visit (HOSPITAL_BASED_OUTPATIENT_CLINIC_OR_DEPARTMENT_OTHER): Payer: BC Managed Care – PPO | Admitting: Lab

## 2013-03-28 ENCOUNTER — Other Ambulatory Visit: Payer: Self-pay | Admitting: *Deleted

## 2013-03-28 ENCOUNTER — Ambulatory Visit (HOSPITAL_BASED_OUTPATIENT_CLINIC_OR_DEPARTMENT_OTHER): Payer: BC Managed Care – PPO

## 2013-03-28 VITALS — BP 158/64 | HR 82 | Temp 98.0°F | Resp 18 | Ht 73.0 in | Wt 261.4 lb

## 2013-03-28 VITALS — BP 116/65 | HR 84 | Temp 98.0°F | Resp 18

## 2013-03-28 DIAGNOSIS — R161 Splenomegaly, not elsewhere classified: Secondary | ICD-10-CM

## 2013-03-28 DIAGNOSIS — M25512 Pain in left shoulder: Secondary | ICD-10-CM

## 2013-03-28 DIAGNOSIS — Z5111 Encounter for antineoplastic chemotherapy: Secondary | ICD-10-CM

## 2013-03-28 DIAGNOSIS — C8588 Other specified types of non-Hodgkin lymphoma, lymph nodes of multiple sites: Secondary | ICD-10-CM

## 2013-03-28 DIAGNOSIS — C8310 Mantle cell lymphoma, unspecified site: Secondary | ICD-10-CM

## 2013-03-28 DIAGNOSIS — C8299 Follicular lymphoma, unspecified, extranodal and solid organ sites: Secondary | ICD-10-CM

## 2013-03-28 DIAGNOSIS — M25519 Pain in unspecified shoulder: Secondary | ICD-10-CM

## 2013-03-28 DIAGNOSIS — L500 Allergic urticaria: Secondary | ICD-10-CM

## 2013-03-28 DIAGNOSIS — D709 Neutropenia, unspecified: Secondary | ICD-10-CM

## 2013-03-28 LAB — COMPREHENSIVE METABOLIC PANEL (CC13)
ALT: 11 U/L (ref 0–55)
AST: 15 U/L (ref 5–34)
Alkaline Phosphatase: 213 U/L — ABNORMAL HIGH (ref 40–150)
Creatinine: 1.2 mg/dL (ref 0.7–1.3)
Total Bilirubin: 0.58 mg/dL (ref 0.20–1.20)

## 2013-03-28 LAB — CBC WITH DIFFERENTIAL/PLATELET
BASO%: 1 % (ref 0.0–2.0)
EOS%: 1 % (ref 0.0–7.0)
LYMPH%: 38.5 % (ref 14.0–49.0)
MCHC: 33.5 g/dL (ref 32.0–36.0)
MCV: 84.9 fL (ref 79.3–98.0)
MONO#: 0.2 10*3/uL (ref 0.1–0.9)
MONO%: 10.9 % (ref 0.0–14.0)
Platelets: 118 10*3/uL — ABNORMAL LOW (ref 140–400)
RBC: 3.9 10*6/uL — ABNORMAL LOW (ref 4.20–5.82)
WBC: 1.9 10*3/uL — ABNORMAL LOW (ref 4.0–10.3)
nRBC: 0 % (ref 0–0)

## 2013-03-28 MED ORDER — MORPHINE SULFATE 4 MG/ML IJ SOLN
2.0000 mg | Freq: Once | INTRAMUSCULAR | Status: AC
  Administered 2013-03-28: 2 mg via INTRAVENOUS

## 2013-03-28 MED ORDER — SODIUM CHLORIDE 0.9 % IV SOLN
375.0000 mg/m2 | Freq: Once | INTRAVENOUS | Status: AC
  Administered 2013-03-28: 900 mg via INTRAVENOUS
  Filled 2013-03-28: qty 90

## 2013-03-28 MED ORDER — METHYLPREDNISOLONE SODIUM SUCC 125 MG IJ SOLR
125.0000 mg | Freq: Once | INTRAMUSCULAR | Status: AC | PRN
  Administered 2013-03-28: 125 mg via INTRAVENOUS

## 2013-03-28 MED ORDER — DIPHENHYDRAMINE HCL 50 MG/ML IJ SOLN
25.0000 mg | Freq: Once | INTRAMUSCULAR | Status: AC
  Administered 2013-03-28: 25 mg via INTRAVENOUS

## 2013-03-28 MED ORDER — DIPHENHYDRAMINE HCL 25 MG PO CAPS
50.0000 mg | ORAL_CAPSULE | Freq: Once | ORAL | Status: AC
  Administered 2013-03-28: 50 mg via ORAL

## 2013-03-28 MED ORDER — ACETAMINOPHEN 325 MG PO TABS
650.0000 mg | ORAL_TABLET | Freq: Once | ORAL | Status: AC
  Administered 2013-03-28: 650 mg via ORAL

## 2013-03-28 MED ORDER — SODIUM CHLORIDE 0.9 % IV SOLN
90.0000 mg/m2 | Freq: Once | INTRAVENOUS | Status: AC
  Administered 2013-03-28: 220 mg via INTRAVENOUS
  Filled 2013-03-28: qty 44

## 2013-03-28 MED ORDER — DIPHENHYDRAMINE HCL 50 MG/ML IJ SOLN
50.0000 mg | Freq: Once | INTRAMUSCULAR | Status: AC | PRN
  Administered 2013-03-28: 25 mg via INTRAVENOUS

## 2013-03-28 MED ORDER — DEXAMETHASONE SODIUM PHOSPHATE 10 MG/ML IJ SOLN
10.0000 mg | Freq: Once | INTRAMUSCULAR | Status: AC
  Administered 2013-03-28: 10 mg via INTRAVENOUS

## 2013-03-28 MED ORDER — ONDANSETRON 8 MG/50ML IVPB (CHCC)
8.0000 mg | Freq: Once | INTRAVENOUS | Status: AC
  Administered 2013-03-28: 8 mg via INTRAVENOUS

## 2013-03-28 MED ORDER — SODIUM CHLORIDE 0.9 % IV SOLN
Freq: Once | INTRAVENOUS | Status: AC
  Administered 2013-03-28: 11:00:00 via INTRAVENOUS

## 2013-03-28 NOTE — Progress Notes (Signed)
At 1540 pt c/o hives and itching.  Noted hives on arms head and pt scratching back.  No other signs of distress. Dr. Clelia Croft notified immediately.  At 1600 Dr. Clelia Croft to bedside.  Ordered 25mg  IV benadryl and to resume Rituxan.

## 2013-03-28 NOTE — Progress Notes (Signed)
Hematology and Oncology Follow Up Visit  Micheal Thompson [id number] 06-29-1951 62 y.o. 03/28/2013 9:14 AM   Principle Diagnosis: 62 year old gentleman  presented with splenomegaly and lymphadenopathy. His workup showed mantle cell lymphoma diagnosed in July of 2014.  Current therapy: Chemotherapy with  bendamustine and rituximab started on 02/28/2013. He is here for cycle 2.   Interim History: Micheal Thompson presents today for a followup visit. He is a pleasant 62 year old gentleman I saw for the first time on 02/07/2013 evaluation of splenomegaly and lymphadenopathy. He underwent a bone marrow biopsy which showed  suggesting mantle cell lymphoma. He underwent the first cycle of B-R chemotherapy without complications. He did have a minor infusion reaction. He still fatigued but his night sweats are improved. He is gaining more weight now. Does report some abdominal pain and early satiety but to a lesser extent.   Medications: I have reviewed the patient's current medications.  Current Outpatient Prescriptions  Medication Sig Dispense Refill  . docusate sodium (COLACE) 100 MG capsule Take 100 mg by mouth daily.      Marland Kitchen HYDROcodone-acetaminophen (NORCO/VICODIN) 5-325 MG per tablet Take 1 tablet by mouth every 6 (six) hours as needed for pain.      Marland Kitchen insulin NPH (HUMULIN N,NOVOLIN N) 100 UNIT/ML injection Inject 40 Units into the skin 2 (two) times daily at 8 am and 10 pm.      . insulin regular (NOVOLIN R,HUMULIN R) 100 units/mL injection Inject 5-30 Units into the skin 3 (three) times daily before meals. Sliding scale 81-120= 5 units, 121-140= 7 units, 141-180-=10 units, 181-240=12 units, 241-280=15 unites, 281-340=22 units, 341-400=25 units, >400=30 units      . lovastatin (MEVACOR) 20 MG tablet Take 20 mg by mouth at bedtime.      . Multiple Vitamin (MULTIVITAMIN WITH MINERALS) TABS Take 1 tablet by mouth daily.      . polyethylene glycol (MIRALAX / GLYCOLAX) packet Take 17 g by mouth daily.      .  promethazine (PHENERGAN) 25 MG tablet Take 1 tablet (25 mg total) by mouth every 6 (six) hours as needed for nausea.  30 tablet  0  . sertraline (ZOLOFT) 50 MG tablet Take 12.5-50 mg by mouth daily at 12 noon. Pt takes a 12.5 mg for 4 days, 25 mg for 4 days, then 50 from there       No current facility-administered medications for this visit.     Allergies:  Allergies  Allergen Reactions  . Penicillins Other (See Comments)    unknown    Past Medical History, Surgical history, Social history, and Family History were reviewed and updated.  Review of Systems: Constitutional:  Negative for fever, chills, night sweats, anorexia, weight loss, pain. Cardiovascular: no chest pain or dyspnea on exertion Respiratory: negative Neurological: negative Dermatological: negative ENT: negative Skin: Negative. Gastrointestinal: negative Genito-Urinary: negative Hematological and Lymphatic: negative Breast: negative Musculoskeletal: negative Remaining ROS negative. Physical Exam: Blood pressure 158/64, pulse 82, temperature 98 F (36.7 C), temperature source Oral, resp. rate 18, height 6\' 1"  (1.854 m), weight 261 lb 6.4 oz (118.57 kg), SpO2 97.00%. ECOG:1  General appearance: alert Head: Normocephalic, without obvious abnormality, atraumatic Neck: no adenopathy, no carotid bruit, no JVD, supple, symmetrical, trachea midline and thyroid not enlarged, symmetric, no tenderness/mass/nodules Lymph nodes: Cervical, supraclavicular, and axillary nodes normal. Heart:regular rate and rhythm, S1, S2 normal, no murmur, click, rub or gallop Lung:chest clear, no wheezing, rales, normal symmetric air entry Abdomin: soft, non-tender, without masses or organomegaly and  Plapable splenomegaly. Soft otherwise.  EXT:no erythema, induration, or nodules   Lab Results: Lab Results  Component Value Date   WBC 1.9* 03/28/2013   HGB 11.1* 03/28/2013   HCT 33.1* 03/28/2013   MCV 84.9 03/28/2013   PLT 118* 03/28/2013      Chemistry      Component Value Date/Time   NA 134* 02/27/2013 1314   NA 139 08/22/2009 2117   K 5.0 02/27/2013 1314   K 4.2 08/22/2009 2117   CL 102 08/22/2009 2117   CO2 24 02/27/2013 1314   CO2 23 08/22/2009 2117   BUN 24.3 02/27/2013 1314   BUN 15 08/22/2009 2117   CREATININE 1.3 02/27/2013 1314   CREATININE 1.11 08/22/2009 2117      Component Value Date/Time   CALCIUM 8.7 02/27/2013 1314   CALCIUM 8.9 08/22/2009 2117   ALKPHOS 354* 02/27/2013 1314   ALKPHOS 115 08/22/2009 2117   AST 24 02/27/2013 1314   AST 19 08/22/2009 2117   ALT 22 02/27/2013 1314   ALT 20 08/22/2009 2117   BILITOT 0.43 02/27/2013 1314   BILITOT 0.4 08/22/2009 2117        Impression and Plan:  62 year old gentleman with the following issues:  1. Diagnosis of non-Hodgkin's lymphoma presented with massive splenomegaly and extensive upper abdominal lymphadenopathy with a diagnosis confirmed in July of 2014. The subtype of this lymphoma is more suggestive of mantle cell been any other type. He did well with Cycle one of B-R. He is to proceed with cycle 2 and repeat CT scan after 3 cycles.   3. Neutropenia: I will add Neulasta after each cycle.   2. Followup: in 4 weeks for cycle 3.   [facility], MD 8/19/20149:14 AM

## 2013-03-28 NOTE — Telephone Encounter (Signed)
Gave pt appt for lab , chemo for August , lab,ML ,MD for Sept, emailed [place] regarding chemo

## 2013-03-28 NOTE — Telephone Encounter (Signed)
Per staff message and POF I have scheduled appts. Unable to schedule long treatment days for 9/16 and 10/14 sue to late MD visit. Schedule advised  JMW

## 2013-03-28 NOTE — Progress Notes (Signed)
1630 - Pt reports beginning to sweat again.   1645 - Pt reports no additional symptoms - sweating a "little better". 1700 - Rituxan infusion completed.

## 2013-03-28 NOTE — Progress Notes (Signed)
Per Dr. Clelia Croft, proceed with chemo today and 03/29/13.  Pt to receive Neulasta injection on day 3. Pt tolerated Rituxan at rate of 300 mg = 114cc x 57 cc at 1225.  At 1230 - pt developed dry coughs, c/o itching all over.  Rituxan stopped at 1230 pm.  Pt developed hives on chest, on both bilateral upper extremities, on back of neck, around both ears , and on top of head.  Pt c/o tightness in chest.  VSs taken, O2 applied at 2L [place].  Dr. Clelia Croft came to infusion to assess pt. Solumedrol 125 mg given IVP, Benadryl 25 mg IVP given as per md's orders.  MD aware of pt's ongoing monitored vital signs. 1235 - pt c/o left shoulder pain at 8/9 out of ten.  Dr. Clelia Croft notified.  Orders received to give Morphine 2 mg IV. Pt c/o pain at left shoulder exacerbated with inspiration. VSs taken. 1253 - Pt stated pain level at 5/6 after morphine.  Pt still c/o shoulder pain, c/o breathing difficulty, hives still noted on neck, chest, and upper arms - " not as bad per pt ". Pt still c/o left shoulder pain at 5/6.  Belenda Cruise, NP came to assess pt.  Orders received to give another 2 mg of Morphine. 1320 - Vitals 140/56, P 82, O2 sat 99 on 2L [place].    Morphine 2 mg given at 1330 pm. Per Belenda Cruise, NP - when pt's pain is at level 3 or less,  Resumed Rituxan at lower rate. 1340 -  Pt stated pain level at 3/4. 1345 -  Pt ambulated to B/R with nurse and wife at his sides - Pt insisted on walking to BR.  Upon returning , pt stated pain level at 2.  Stated no breathing difficulty, itching has resolved. 1355 -  Rituxan resumed at rate of 200 mg - 76cc x 38cc.  Pt was being monitored very closely.  Wife at chair side. Pt tolerated rituxan with rate increased at 300 mg - 114cc x 57 cc.    1500 - pt c/o sweating on forehead, upper arms sweating.  Pt denied breathing difficulty, denied any pain. Still A&O x 3.  Dr. Clelia Croft notified.  Order received to continue with chemo. Pt tolerated Rituxan at max rate of 400 mg - 152cc x 76cc.  VSs taken and  documented.  1540 - Upon returning from BR, pt c/o itching all over chest, back, neck, and upper arms.  Hives noted on these areas.   Rituxan stopped at 1545.  Dr. Clelia Croft came to assess pt. Order received to give Benadryl 25 mg IV , then resume rituxan as ordered.   Explanations given to pt.  Pt verbalized understanding.

## 2013-03-28 NOTE — Patient Instructions (Addendum)
[facility] Discharge Instructions for Patients Receiving Chemotherapy  Today you received the following chemotherapy agents :  Rituxan, Treanda.  To help prevent nausea and vomiting after your treatment, we encourage you to take your nausea medication as instructed by your physician.   If you develop nausea and vomiting that is not controlled by your nausea medication, call the clinic.   BELOW ARE SYMPTOMS THAT SHOULD BE REPORTED IMMEDIATELY:  *FEVER GREATER THAN 100.5 F  *CHILLS WITH OR WITHOUT FEVER  NAUSEA AND VOMITING THAT IS NOT CONTROLLED WITH YOUR NAUSEA MEDICATION  *UNUSUAL SHORTNESS OF BREATH  *UNUSUAL BRUISING OR BLEEDING  TENDERNESS IN MOUTH AND THROAT WITH OR WITHOUT PRESENCE OF ULCERS  *URINARY PROBLEMS  *BOWEL PROBLEMS  UNUSUAL RASH Items with * indicate a potential emergency and should be followed up as soon as possible.  Feel free to call the clinic you have any questions or concerns. The clinic phone number is [phone].

## 2013-03-28 NOTE — Progress Notes (Signed)
Addendum: Patient continued to have mild infusion reaction to rituximab. His infusion reactions constitutes hives, slight dyspnea but no change in his vital signs. His infusion was interrupted on multiple occasions and required extra doses of Benadryl as well as Solu-Medrol. He did did develop left sided shoulder pain and required IV morphine that pain subsided with interruption of the rituximab infusion. Also out his infusion reactions. I was personally present and evaluated the patient including physical examination and assessment of his vital signs. Upon completion of the rituximab, I personally evaluated and examined Micheal Thompson and he was feeling comfortable and his vital signs all within normal range.

## 2013-03-29 ENCOUNTER — Telehealth: Payer: Self-pay | Admitting: Oncology

## 2013-03-29 ENCOUNTER — Telehealth: Payer: Self-pay | Admitting: *Deleted

## 2013-03-29 ENCOUNTER — Ambulatory Visit (HOSPITAL_BASED_OUTPATIENT_CLINIC_OR_DEPARTMENT_OTHER): Payer: BC Managed Care – PPO

## 2013-03-29 DIAGNOSIS — Z5111 Encounter for antineoplastic chemotherapy: Secondary | ICD-10-CM

## 2013-03-29 DIAGNOSIS — R161 Splenomegaly, not elsewhere classified: Secondary | ICD-10-CM

## 2013-03-29 DIAGNOSIS — C8588 Other specified types of non-Hodgkin lymphoma, lymph nodes of multiple sites: Secondary | ICD-10-CM

## 2013-03-29 MED ORDER — SODIUM CHLORIDE 0.9 % IV SOLN
Freq: Once | INTRAVENOUS | Status: AC
  Administered 2013-03-29: 12:00:00 via INTRAVENOUS

## 2013-03-29 MED ORDER — SODIUM CHLORIDE 0.9 % IV SOLN
90.0000 mg/m2 | Freq: Once | INTRAVENOUS | Status: AC
  Administered 2013-03-29: 220 mg via INTRAVENOUS
  Filled 2013-03-29: qty 44

## 2013-03-29 MED ORDER — DEXAMETHASONE SODIUM PHOSPHATE 10 MG/ML IJ SOLN
10.0000 mg | Freq: Once | INTRAMUSCULAR | Status: AC
  Administered 2013-03-29: 10 mg via INTRAVENOUS

## 2013-03-29 MED ORDER — ONDANSETRON 8 MG/50ML IVPB (CHCC)
8.0000 mg | Freq: Once | INTRAVENOUS | Status: AC
  Administered 2013-03-29: 8 mg via INTRAVENOUS

## 2013-03-29 NOTE — Telephone Encounter (Signed)
Talked to pt , he will get appt calendar appt today before he leaves the building, appt for MD and ML has been moved to early slot to accomodate chemo

## 2013-03-29 NOTE — Telephone Encounter (Signed)
Per staff message and POF I have scheduled appts.  JMW  

## 2013-03-29 NOTE — Patient Instructions (Addendum)
[facility] Discharge Instructions for Patients Receiving Chemotherapy  Today you received the following chemotherapy agents Treanda. To help prevent nausea and vomiting after your treatment, we encourage you to take your nausea medication as directed.    If you develop nausea and vomiting that is not controlled by your nausea medication, call the clinic.   BELOW ARE SYMPTOMS THAT SHOULD BE REPORTED IMMEDIATELY:  *FEVER GREATER THAN 100.5 F  *CHILLS WITH OR WITHOUT FEVER  NAUSEA AND VOMITING THAT IS NOT CONTROLLED WITH YOUR NAUSEA MEDICATION  *UNUSUAL SHORTNESS OF BREATH  *UNUSUAL BRUISING OR BLEEDING  TENDERNESS IN MOUTH AND THROAT WITH OR WITHOUT PRESENCE OF ULCERS  *URINARY PROBLEMS  *BOWEL PROBLEMS  UNUSUAL RASH Items with * indicate a potential emergency and should be followed up as soon as possible.  Feel free to call the clinic you have any questions or concerns. The clinic phone number is [phone].    

## 2013-03-29 NOTE — Telephone Encounter (Signed)
Gave pt appt for lab, Md and chemo for August  and September 2014 °

## 2013-03-30 ENCOUNTER — Ambulatory Visit (HOSPITAL_BASED_OUTPATIENT_CLINIC_OR_DEPARTMENT_OTHER): Payer: BC Managed Care – PPO

## 2013-03-30 VITALS — BP 121/53 | HR 83 | Temp 98.1°F

## 2013-03-30 DIAGNOSIS — R161 Splenomegaly, not elsewhere classified: Secondary | ICD-10-CM

## 2013-03-30 DIAGNOSIS — D709 Neutropenia, unspecified: Secondary | ICD-10-CM

## 2013-03-30 MED ORDER — PEGFILGRASTIM INJECTION 6 MG/0.6ML
6.0000 mg | Freq: Once | SUBCUTANEOUS | Status: AC
  Administered 2013-03-30: 6 mg via SUBCUTANEOUS
  Filled 2013-03-30: qty 0.6

## 2013-04-25 ENCOUNTER — Other Ambulatory Visit (HOSPITAL_BASED_OUTPATIENT_CLINIC_OR_DEPARTMENT_OTHER): Payer: BC Managed Care – PPO | Admitting: Lab

## 2013-04-25 ENCOUNTER — Ambulatory Visit (HOSPITAL_BASED_OUTPATIENT_CLINIC_OR_DEPARTMENT_OTHER): Payer: BC Managed Care – PPO

## 2013-04-25 ENCOUNTER — Ambulatory Visit (HOSPITAL_BASED_OUTPATIENT_CLINIC_OR_DEPARTMENT_OTHER): Payer: BC Managed Care – PPO | Admitting: Family

## 2013-04-25 ENCOUNTER — Ambulatory Visit: Payer: BC Managed Care – PPO | Admitting: Oncology

## 2013-04-25 ENCOUNTER — Encounter: Payer: Self-pay | Admitting: Family

## 2013-04-25 VITALS — BP 133/70 | HR 96 | Temp 97.4°F | Resp 18

## 2013-04-25 VITALS — BP 165/66 | HR 92 | Temp 98.5°F | Resp 18 | Ht 73.0 in | Wt 254.4 lb

## 2013-04-25 DIAGNOSIS — C8588 Other specified types of non-Hodgkin lymphoma, lymph nodes of multiple sites: Secondary | ICD-10-CM

## 2013-04-25 DIAGNOSIS — R630 Anorexia: Secondary | ICD-10-CM

## 2013-04-25 DIAGNOSIS — R110 Nausea: Secondary | ICD-10-CM

## 2013-04-25 DIAGNOSIS — Z5112 Encounter for antineoplastic immunotherapy: Secondary | ICD-10-CM

## 2013-04-25 DIAGNOSIS — G4700 Insomnia, unspecified: Secondary | ICD-10-CM

## 2013-04-25 DIAGNOSIS — C8299 Follicular lymphoma, unspecified, extranodal and solid organ sites: Secondary | ICD-10-CM

## 2013-04-25 DIAGNOSIS — R161 Splenomegaly, not elsewhere classified: Secondary | ICD-10-CM

## 2013-04-25 DIAGNOSIS — C8590 Non-Hodgkin lymphoma, unspecified, unspecified site: Secondary | ICD-10-CM

## 2013-04-25 DIAGNOSIS — Z5111 Encounter for antineoplastic chemotherapy: Secondary | ICD-10-CM

## 2013-04-25 LAB — COMPREHENSIVE METABOLIC PANEL (CC13)
BUN: 23.7 mg/dL (ref 7.0–26.0)
CO2: 21 mEq/L — ABNORMAL LOW (ref 22–29)
Calcium: 8.9 mg/dL (ref 8.4–10.4)
Chloride: 104 mEq/L (ref 98–109)
Creatinine: 1.2 mg/dL (ref 0.7–1.3)

## 2013-04-25 LAB — CBC WITH DIFFERENTIAL/PLATELET
Basophils Absolute: 0.1 10*3/uL (ref 0.0–0.1)
EOS%: 2.9 % (ref 0.0–7.0)
HCT: 37 % — ABNORMAL LOW (ref 38.4–49.9)
HGB: 12.9 g/dL — ABNORMAL LOW (ref 13.0–17.1)
LYMPH%: 7.3 % — ABNORMAL LOW (ref 14.0–49.0)
MCH: 29.8 pg (ref 27.2–33.4)
NEUT%: 81.7 % — ABNORMAL HIGH (ref 39.0–75.0)
Platelets: 172 10*3/uL (ref 140–400)
lymph#: 0.5 10*3/uL — ABNORMAL LOW (ref 0.9–3.3)

## 2013-04-25 MED ORDER — DIPHENHYDRAMINE HCL 50 MG/ML IJ SOLN
25.0000 mg | Freq: Once | INTRAMUSCULAR | Status: AC | PRN
  Administered 2013-04-25: 25 mg via INTRAVENOUS

## 2013-04-25 MED ORDER — SODIUM CHLORIDE 0.9 % IV SOLN
90.0000 mg/m2 | Freq: Once | INTRAVENOUS | Status: AC
  Administered 2013-04-25: 220 mg via INTRAVENOUS
  Filled 2013-04-25: qty 44

## 2013-04-25 MED ORDER — ONDANSETRON 8 MG/NS 50 ML IVPB
INTRAVENOUS | Status: AC
  Filled 2013-04-25: qty 8

## 2013-04-25 MED ORDER — DIPHENHYDRAMINE HCL 50 MG/ML IJ SOLN
INTRAMUSCULAR | Status: AC
  Filled 2013-04-25: qty 1

## 2013-04-25 MED ORDER — ACETAMINOPHEN 325 MG PO TABS
650.0000 mg | ORAL_TABLET | Freq: Once | ORAL | Status: AC
  Administered 2013-04-25: 650 mg via ORAL

## 2013-04-25 MED ORDER — METHYLPREDNISOLONE SODIUM SUCC 125 MG IJ SOLR
INTRAMUSCULAR | Status: AC
  Filled 2013-04-25: qty 2

## 2013-04-25 MED ORDER — DEXAMETHASONE SODIUM PHOSPHATE 10 MG/ML IJ SOLN
10.0000 mg | Freq: Once | INTRAMUSCULAR | Status: AC
  Administered 2013-04-25: 10 mg via INTRAVENOUS

## 2013-04-25 MED ORDER — ONDANSETRON 8 MG/50ML IVPB (CHCC)
8.0000 mg | Freq: Once | INTRAVENOUS | Status: AC
  Administered 2013-04-25: 8 mg via INTRAVENOUS

## 2013-04-25 MED ORDER — DIPHENHYDRAMINE HCL 25 MG PO CAPS
ORAL_CAPSULE | ORAL | Status: AC
  Filled 2013-04-25: qty 2

## 2013-04-25 MED ORDER — DEXAMETHASONE SODIUM PHOSPHATE 10 MG/ML IJ SOLN
INTRAMUSCULAR | Status: AC
  Filled 2013-04-25: qty 1

## 2013-04-25 MED ORDER — ACETAMINOPHEN 325 MG PO TABS
ORAL_TABLET | ORAL | Status: AC
  Filled 2013-04-25: qty 2

## 2013-04-25 MED ORDER — METHYLPREDNISOLONE SODIUM SUCC 125 MG IJ SOLR
125.0000 mg | Freq: Once | INTRAMUSCULAR | Status: AC | PRN
  Administered 2013-04-25: 125 mg via INTRAVENOUS

## 2013-04-25 MED ORDER — DIPHENHYDRAMINE HCL 25 MG PO CAPS
50.0000 mg | ORAL_CAPSULE | Freq: Once | ORAL | Status: AC
  Administered 2013-04-25: 50 mg via ORAL

## 2013-04-25 MED ORDER — SODIUM CHLORIDE 0.9 % IV SOLN
Freq: Once | INTRAVENOUS | Status: AC
  Administered 2013-04-25: 10:00:00 via INTRAVENOUS

## 2013-04-25 MED ORDER — RITUXIMAB CHEMO INJECTION 10 MG/ML
375.0000 mg/m2 | Freq: Once | INTRAVENOUS | Status: AC
  Administered 2013-04-25: 900 mg via INTRAVENOUS
  Filled 2013-04-25: qty 90

## 2013-04-25 NOTE — Patient Instructions (Addendum)
[facility] Discharge Instructions for Patients Receiving Chemotherapy  Today you received the following chemotherapy agents rituxan, treanda  To help prevent nausea and vomiting after your treatment, we encourage you to take your nausea medication as prescribed by your physician.   If you develop nausea and vomiting that is not controlled by your nausea medication, call the clinic.   BELOW ARE SYMPTOMS THAT SHOULD BE REPORTED IMMEDIATELY:  *FEVER GREATER THAN 100.5 F  *CHILLS WITH OR WITHOUT FEVER  NAUSEA AND VOMITING THAT IS NOT CONTROLLED WITH YOUR NAUSEA MEDICATION  *UNUSUAL SHORTNESS OF BREATH  *UNUSUAL BRUISING OR BLEEDING  TENDERNESS IN MOUTH AND THROAT WITH OR WITHOUT PRESENCE OF ULCERS  *URINARY PROBLEMS  *BOWEL PROBLEMS  UNUSUAL RASH Items with * indicate a potential emergency and should be followed up as soon as possible.  Feel free to call the clinic you have any questions or concerns. The clinic phone number is [phone].

## 2013-04-25 NOTE — Progress Notes (Signed)
1405- Pt itching around his neck and his cheeks are flushed. He also started sneezing and puffiness noted above his right eye. Dr. Clelia Croft notified and orders for benadryl 25 mg IV given. 1445- All itching stopped and flushing gone. Pt stopped sneezing and puffiness over his right eye resolved.

## 2013-04-25 NOTE — Progress Notes (Signed)
[facility]  [phone] [phone]  OFFICE PROGRESS NOTE   ID: Micheal Thompson   DOB: 02-Apr-1951  MR#: [id number]  ZHY#:[id number]   PCP: Micheal Kayser, MD   Principle Diagnosis: 62 y.o.  gentleman  presented with splenomegaly and lymphadenopathy. His workup showed mantle cell lymphoma diagnosed in July of 2014.  Current therapy: Chemotherapy with  Bendamustine and Rituximab started on 02/28/2013. He is here for cycle #3.   Interim History: Micheal Thompson presents today for a followup visit for toxicity assessment and laboratory evaluation prior to receiving chemotherapy cycle 3 consisting of Bendamustine and Rituximab.  It will be recalled that Micheal Thompson was first seen by the patient on 02/07/2013 for evaluation of splenomegaly and lymphadenopathy.   He underwent a bone marrow biopsy on 02/15/2013 which showed B cell lymphoproliferative process with histologic and phenotypic features that are consistent with mantle cell lymphoma.  He underwent the first cycle of B-R chemotherapy without complications. He continues to have a minor infusion reactions (pruritus). He has continued complaints of fatigue secondary to insomnia x 20 years, a decrease in appetite, nausea, and decreased energy level.  His interval history is otherwise unremarkable.   Medications: I have reviewed the patient's current medications.  Current Outpatient Prescriptions  Medication Sig Dispense Refill  . HYDROcodone-acetaminophen (NORCO/VICODIN) 5-325 MG per tablet Take 1 tablet by mouth every 6 (six) hours as needed for pain.      Marland Kitchen insulin NPH (HUMULIN N,NOVOLIN N) 100 UNIT/ML injection Inject 40 Units into the skin 2 (two) times daily at 8 am and 10 pm.      . insulin regular (NOVOLIN R,HUMULIN R) 100 units/mL injection Inject 5-30 Units into the skin 3 (three) times daily before meals. Sliding scale 81-120= 5 units, 121-140= 7 units, 141-180-=10 units, 181-240=12 units, 241-280=15 unites,  281-340=22 units, 341-400=25 units, >400=30 units      . lovastatin (MEVACOR) 20 MG tablet Take 20 mg by mouth at bedtime.      . Multiple Vitamin (MULTIVITAMIN WITH MINERALS) TABS Take 1 tablet by mouth daily.      . polyethylene glycol (MIRALAX / GLYCOLAX) packet Take 17 g by mouth daily.      . promethazine (PHENERGAN) 25 MG tablet Take 1 tablet (25 mg total) by mouth every 6 (six) hours as needed for nausea.  30 tablet  0  . sertraline (ZOLOFT) 50 MG tablet Take 50 mg by mouth daily at 12 noon.       . traZODone (DESYREL) 100 MG tablet Take 100 mg by mouth at bedtime.      . docusate sodium (COLACE) 100 MG capsule Take 100 mg by mouth daily.       No current facility-administered medications for this visit.    Allergies:  Allergies  Allergen Reactions  . Penicillins Other (See Comments)    unknown   Medical History: Past Medical History  Diagnosis Date  . H/O colonoscopy 01/13/2013  . Hyperlipidemia   . Diabetes mellitus without complication   . Hypertension   . Depression   . H/O Bell's palsy   . Pancarditis   . ED (erectile dysfunction)   . Colon polyps   . COPD (chronic obstructive pulmonary disease)   . GERD (gastroesophageal reflux disease)   . Non Hodgkin's lymphoma    Surgical History: Past Surgical History  Procedure Laterality Date  . Hernia repair    . Shoulder arthroscopy    . Cholecystectomy     Family History:  Family History  Problem Relation Age of Onset  . Cancer Maternal Grandmother     unknown  . Heart attack Brother    Social History: History  Substance Use Topics  . Smoking status: Former Smoker    Quit date: 08/11/1983  . Smokeless tobacco: Never Used  . Alcohol Use: No    Review of Systems: Attend her review of systems was completed and is negative except as noted above.  Micheal Thompson denies any other symptomatology including fever or chills, headache, vision changes, swollen glands, cough or shortness of breath, chest pain or  discomfort,  vomiting, diarrhea, constipation, change in urinary or bowel habits, arthralgias/myalgias, unusual bleeding/bruising or any other symptomatology.  Physical Exam: Blood pressure 165/66, pulse 92, temperature 98.5 F (36.9 C), temperature source Oral, resp. rate 18, height 6\' 1"  (1.854 m), weight 254 lb 6.4 oz (115.395 kg), SpO2 98.00%.  ECOG FS: 1 - Symptomatic but completely ambulatory  General appearance: Alert, cooperative, well nourished, no apparent distress Head: Normocephalic, without obvious abnormality, atraumatic Eyes: Mild arcus senilis, PERRLA, EOMI Nose: Nares, septum and mucosa are normal, no drainage or sinus tenderness Neck: No adenopathy, supple, symmetrical, trachea midline, no tenderness Resp: Clear to auscultation bilaterally, no wheezes/rales/rhonchi Cardio: Regular rate and rhythm, S1, S2 normal, no murmur, click, rub or gallop, no edema GI: Soft, distended, non-tender, hypoactive bowel sounds, palpable splenomegaly Skin: No rashes/lesions, skin warm and dry, no erythematous areas, no cyanosis  M/S:  Atraumatic, normal strength in all extremities, normal range of motion, no clubbing  Lymph nodes: Cervical, supraclavicular, and axillary nodes normal Neurologic: Grossly normal, cranial nerves II through XII intact, alert and oriented x 3 Psych: Appropriate affect   Lab Results: Lab Results  Component Value Date   WBC 6.3 04/25/2013   HGB 12.9* 04/25/2013   HCT 37.0* 04/25/2013   MCV 85.5 04/25/2013   PLT 172 04/25/2013     Chemistry      Component Value Date/Time   NA 134* 03/28/2013 0830   NA 139 08/22/2009 2117   K 5.2* 03/28/2013 0830   K 4.2 08/22/2009 2117   CL 102 08/22/2009 2117   CO2 19* 03/28/2013 0830   CO2 23 08/22/2009 2117   BUN 28.1* 03/28/2013 0830   BUN 15 08/22/2009 2117   CREATININE 1.2 03/28/2013 0830   CREATININE 1.11 08/22/2009 2117      Component Value Date/Time   CALCIUM 8.5 03/28/2013 0830   CALCIUM 8.9 08/22/2009 2117   ALKPHOS  213* 03/28/2013 0830   ALKPHOS 115 08/22/2009 2117   AST 15 03/28/2013 0830   AST 19 08/22/2009 2117   ALT 11 03/28/2013 0830   ALT 20 08/22/2009 2117   BILITOT 0.58 03/28/2013 0830   BILITOT 0.4 08/22/2009 2117      Assessment and Plan: Mr. Nienhaus is a 62 y.o.  gentleman with the following issues:  1. Diagnosis of non-Hodgkin's lymphoma presented with massive splenomegaly and extensive upper abdominal lymphadenopathy with a diagnosis confirmed in 02/2013. The subtype of this lymphoma is more suggestive of mantle cell than any other type.  Mr. Busch tolerated chemotherapy therapy cycle #1 of Bendamustine and Rituximab well.  During cycle 2 the patient had a mild infusion reaction (pruritus) during Rituximab infusion.  He will proceed to chemotherapy cycle #3 today with increased monitoring due to history of infusion reactions to Rituximab.  Repeat CT scans have been ordered after this chemotherapy cycle.  2. Granulocyte support: The patient is receiving a Neulasta injection after each  chemotherapy cycle.   3. CT scans of chest with contrast and of the abdomen/pelvis with contrast are scheduled for 05/22/2013.  4.  Mr. Pellum has been asked to take Phenergan before meals with regards his decreased appetite secondary to nausea.  5.  Mr. Isakson is currently taking Trazodone 100 mg by mouth each bedtime for insomnia.  He states he has increasing tolerance to this medication.  The patient was advised to speak to his primary care physician about his chronic insomnia.  6.  Mr. Fowles was also asked to be more physically active with daily walking (as tolerated) to hopefully help with his decreased energy level/fatigue.  We plan to see Mr. Sotomayor again on 05/23/2013 for an office visit and to discuss CT scan results.  Laboratories CBC and CMP and Cycle 4 of chemotherapy consisting Bendamustine and Rituximab are scheduled on this date.  The patient will be scheduled for a Neulasta injection on  05/25/2013.  All questions answered.  Mr. Lahaie is encouraged to contact us in the interim with any questions, concerns, or problems.  Larina Bras, NP-C 9/16/20149:26 AM

## 2013-04-25 NOTE — Patient Instructions (Signed)
Please contact us at [phone] if you have any questions or concerns.  Please continue to do well and enjoy life!!!  Get plenty of rest, drink plenty of water, exercise daily - walking as tolerated, eat a balanced diet.  Results for orders placed in visit on 04/25/13 (from the past 24 hour(s))  CBC WITH DIFFERENTIAL     Status: Abnormal   Collection Time    04/25/13  8:16 AM      Result Value Range   WBC 6.3  4.0 - 10.3 10e3/uL   NEUT# 5.1  1.5 - 6.5 10e3/uL   HGB 12.9 (*) 13.0 - 17.1 g/dL   HCT 16.1 (*) 09.6 - 04.5 %   Platelets 172  140 - 400 10e3/uL   MCV 85.5  79.3 - 98.0 fL   MCH 29.8  27.2 - 33.4 pg   MCHC 34.9  32.0 - 36.0 g/dL   RBC 4.09  8.11 - 9.14 10e6/uL   RDW 16.5 (*) 11.0 - 14.6 %   lymph# 0.5 (*) 0.9 - 3.3 10e3/uL   MONO# 0.5  0.1 - 0.9 10e3/uL   Eosinophils Absolute 0.2  0.0 - 0.5 10e3/uL   Basophils Absolute 0.1  0.0 - 0.1 10e3/uL   NEUT% 81.7 (*) 39.0 - 75.0 %   LYMPH% 7.3 (*) 14.0 - 49.0 %   MONO% 7.3  0.0 - 14.0 %   EOS% 2.9  0.0 - 7.0 %   BASO% 0.8  0.0 - 2.0 %   Narrative:    Performed At:  [facility]               [address]               [place], [place] [address]

## 2013-04-26 ENCOUNTER — Telehealth: Payer: Self-pay | Admitting: *Deleted

## 2013-04-26 ENCOUNTER — Ambulatory Visit (HOSPITAL_BASED_OUTPATIENT_CLINIC_OR_DEPARTMENT_OTHER): Payer: BC Managed Care – PPO

## 2013-04-26 VITALS — BP 130/63 | HR 80 | Temp 98.4°F

## 2013-04-26 DIAGNOSIS — Z5111 Encounter for antineoplastic chemotherapy: Secondary | ICD-10-CM

## 2013-04-26 DIAGNOSIS — R161 Splenomegaly, not elsewhere classified: Secondary | ICD-10-CM

## 2013-04-26 DIAGNOSIS — C8588 Other specified types of non-Hodgkin lymphoma, lymph nodes of multiple sites: Secondary | ICD-10-CM

## 2013-04-26 MED ORDER — ONDANSETRON 8 MG/NS 50 ML IVPB
INTRAVENOUS | Status: AC
  Filled 2013-04-26: qty 8

## 2013-04-26 MED ORDER — DEXAMETHASONE SODIUM PHOSPHATE 10 MG/ML IJ SOLN
10.0000 mg | Freq: Once | INTRAMUSCULAR | Status: AC
  Administered 2013-04-26: 10 mg via INTRAVENOUS

## 2013-04-26 MED ORDER — SODIUM CHLORIDE 0.9 % IV SOLN: Freq: Once | INTRAVENOUS | Status: DC

## 2013-04-26 MED ORDER — SODIUM CHLORIDE 0.9 % IV SOLN
90.0000 mg/m2 | Freq: Once | INTRAVENOUS | Status: AC
  Administered 2013-04-26: 220 mg via INTRAVENOUS
  Filled 2013-04-26: qty 44

## 2013-04-26 MED ORDER — ONDANSETRON 8 MG/50ML IVPB (CHCC)
8.0000 mg | Freq: Once | INTRAVENOUS | Status: AC
  Administered 2013-04-26: 8 mg via INTRAVENOUS

## 2013-04-26 MED ORDER — DEXAMETHASONE SODIUM PHOSPHATE 10 MG/ML IJ SOLN
INTRAMUSCULAR | Status: AC
  Filled 2013-04-26: qty 1

## 2013-04-26 NOTE — Patient Instructions (Addendum)
[facility] Discharge Instructions for Patients Receiving Chemotherapy  Today you received the following chemotherapy agents Treanda. To help prevent nausea and vomiting after your treatment, we encourage you to take your nausea medication as directed.    If you develop nausea and vomiting that is not controlled by your nausea medication, call the clinic.   BELOW ARE SYMPTOMS THAT SHOULD BE REPORTED IMMEDIATELY:  *FEVER GREATER THAN 100.5 F  *CHILLS WITH OR WITHOUT FEVER  NAUSEA AND VOMITING THAT IS NOT CONTROLLED WITH YOUR NAUSEA MEDICATION  *UNUSUAL SHORTNESS OF BREATH  *UNUSUAL BRUISING OR BLEEDING  TENDERNESS IN MOUTH AND THROAT WITH OR WITHOUT PRESENCE OF ULCERS  *URINARY PROBLEMS  *BOWEL PROBLEMS  UNUSUAL RASH Items with * indicate a potential emergency and should be followed up as soon as possible.  Feel free to call the clinic you have any questions or concerns. The clinic phone number is [phone].    

## 2013-04-26 NOTE — Telephone Encounter (Signed)
sw pt gv appt for 05/25/13 @ 10:45 for inj. Pt is aware.Marland Kitchentd

## 2013-04-27 ENCOUNTER — Encounter: Payer: Self-pay | Admitting: Oncology

## 2013-04-27 ENCOUNTER — Ambulatory Visit (HOSPITAL_BASED_OUTPATIENT_CLINIC_OR_DEPARTMENT_OTHER): Payer: BC Managed Care – PPO

## 2013-04-27 VITALS — BP 133/62 | HR 74 | Temp 98.5°F

## 2013-04-27 DIAGNOSIS — D709 Neutropenia, unspecified: Secondary | ICD-10-CM

## 2013-04-27 DIAGNOSIS — R161 Splenomegaly, not elsewhere classified: Secondary | ICD-10-CM

## 2013-04-27 MED ORDER — PEGFILGRASTIM INJECTION 6 MG/0.6ML
6.0000 mg | Freq: Once | SUBCUTANEOUS | Status: AC
  Administered 2013-04-27: 6 mg via SUBCUTANEOUS
  Filled 2013-04-27: qty 0.6

## 2013-04-27 NOTE — Progress Notes (Signed)
Enrolled pt in the Neulasta First Step program.  I faxed signed form and activated card today.  °

## 2013-05-22 ENCOUNTER — Encounter (HOSPITAL_COMMUNITY): Payer: Self-pay

## 2013-05-22 ENCOUNTER — Telehealth: Payer: Self-pay | Admitting: *Deleted

## 2013-05-22 ENCOUNTER — Other Ambulatory Visit (HOSPITAL_BASED_OUTPATIENT_CLINIC_OR_DEPARTMENT_OTHER): Payer: BC Managed Care – PPO | Admitting: Lab

## 2013-05-22 ENCOUNTER — Ambulatory Visit (HOSPITAL_COMMUNITY)
Admission: RE | Admit: 2013-05-22 | Discharge: 2013-05-22 | Disposition: A | Discharge status: Home / Self Care | Payer: BC Managed Care – PPO | Source: Ambulatory Visit | Attending: Oncology | Admitting: Oncology

## 2013-05-22 DIAGNOSIS — K7689 Other specified diseases of liver: Secondary | ICD-10-CM | POA: Insufficient documentation

## 2013-05-22 DIAGNOSIS — C8589 Other specified types of non-Hodgkin lymphoma, extranodal and solid organ sites: Secondary | ICD-10-CM | POA: Insufficient documentation

## 2013-05-22 DIAGNOSIS — I2510 Atherosclerotic heart disease of native coronary artery without angina pectoris: Secondary | ICD-10-CM | POA: Insufficient documentation

## 2013-05-22 DIAGNOSIS — Z79899 Other long term (current) drug therapy: Secondary | ICD-10-CM

## 2013-05-22 DIAGNOSIS — K439 Ventral hernia without obstruction or gangrene: Secondary | ICD-10-CM | POA: Insufficient documentation

## 2013-05-22 DIAGNOSIS — D6481 Anemia due to antineoplastic chemotherapy: Secondary | ICD-10-CM | POA: Diagnosis present

## 2013-05-22 DIAGNOSIS — T451X5A Adverse effect of antineoplastic and immunosuppressive drugs, initial encounter: Secondary | ICD-10-CM | POA: Diagnosis present

## 2013-05-22 DIAGNOSIS — F3289 Other specified depressive episodes: Secondary | ICD-10-CM | POA: Diagnosis present

## 2013-05-22 DIAGNOSIS — F329 Major depressive disorder, single episode, unspecified: Secondary | ICD-10-CM | POA: Diagnosis present

## 2013-05-22 DIAGNOSIS — R161 Splenomegaly, not elsewhere classified: Secondary | ICD-10-CM | POA: Diagnosis present

## 2013-05-22 DIAGNOSIS — Z87891 Personal history of nicotine dependence: Secondary | ICD-10-CM

## 2013-05-22 DIAGNOSIS — K219 Gastro-esophageal reflux disease without esophagitis: Secondary | ICD-10-CM | POA: Diagnosis present

## 2013-05-22 DIAGNOSIS — Z794 Long term (current) use of insulin: Secondary | ICD-10-CM

## 2013-05-22 DIAGNOSIS — I10 Essential (primary) hypertension: Secondary | ICD-10-CM | POA: Diagnosis present

## 2013-05-22 DIAGNOSIS — N281 Cyst of kidney, acquired: Secondary | ICD-10-CM | POA: Insufficient documentation

## 2013-05-22 DIAGNOSIS — C8299 Follicular lymphoma, unspecified, extranodal and solid organ sites: Secondary | ICD-10-CM

## 2013-05-22 DIAGNOSIS — K36 Other appendicitis: Secondary | ICD-10-CM | POA: Diagnosis present

## 2013-05-22 DIAGNOSIS — K389 Disease of appendix, unspecified: Secondary | ICD-10-CM | POA: Insufficient documentation

## 2013-05-22 DIAGNOSIS — E785 Hyperlipidemia, unspecified: Secondary | ICD-10-CM | POA: Diagnosis present

## 2013-05-22 DIAGNOSIS — K3580 Unspecified acute appendicitis: Principal | ICD-10-CM | POA: Diagnosis present

## 2013-05-22 DIAGNOSIS — J449 Chronic obstructive pulmonary disease, unspecified: Secondary | ICD-10-CM | POA: Diagnosis present

## 2013-05-22 DIAGNOSIS — C8319 Mantle cell lymphoma, extranodal and solid organ sites: Secondary | ICD-10-CM | POA: Diagnosis present

## 2013-05-22 DIAGNOSIS — Z880 Allergy status to penicillin: Secondary | ICD-10-CM

## 2013-05-22 DIAGNOSIS — Z9221 Personal history of antineoplastic chemotherapy: Secondary | ICD-10-CM

## 2013-05-22 DIAGNOSIS — J4489 Other specified chronic obstructive pulmonary disease: Secondary | ICD-10-CM | POA: Diagnosis present

## 2013-05-22 DIAGNOSIS — E119 Type 2 diabetes mellitus without complications: Secondary | ICD-10-CM | POA: Diagnosis present

## 2013-05-22 DIAGNOSIS — D72829 Elevated white blood cell count, unspecified: Secondary | ICD-10-CM | POA: Diagnosis present

## 2013-05-22 LAB — CBC WITH DIFFERENTIAL/PLATELET
Basophils Absolute: 0 10*3/uL (ref 0.0–0.1)
Eosinophils Absolute: 0.2 10*3/uL (ref 0.0–0.5)
HCT: 35.9 % — ABNORMAL LOW (ref 38.4–49.9)
LYMPH%: 4 % — ABNORMAL LOW (ref 14.0–49.0)
MCV: 87.1 fL (ref 79.3–98.0)
MONO%: 8.5 % (ref 0.0–14.0)
NEUT#: 6.4 10*3/uL (ref 1.5–6.5)
NEUT%: 84.1 % — ABNORMAL HIGH (ref 39.0–75.0)
Platelets: 188 10*3/uL (ref 140–400)
RBC: 4.12 10*6/uL — ABNORMAL LOW (ref 4.20–5.82)

## 2013-05-22 LAB — COMPREHENSIVE METABOLIC PANEL (CC13)
Anion Gap: 10 mEq/L (ref 3–11)
BUN: 20.6 mg/dL (ref 7.0–26.0)
CO2: 23 mEq/L (ref 22–29)
Calcium: 9 mg/dL (ref 8.4–10.4)
Creatinine: 0.9 mg/dL (ref 0.7–1.3)
Glucose: 249 mg/dl — ABNORMAL HIGH (ref 70–140)
Total Bilirubin: 0.38 mg/dL (ref 0.20–1.20)

## 2013-05-22 MED ORDER — IOHEXOL 300 MG/ML  SOLN
100.0000 mL | Freq: Once | INTRAMUSCULAR | Status: AC | PRN
  Administered 2013-05-22: 100 mL via INTRAVENOUS

## 2013-05-22 NOTE — Telephone Encounter (Signed)
Dr. Molli Posey called reporting patient is s/p CT chest and the appendix is enlarged with edema.  Called Patient, message left requesting return call for further assessment.  Awaiting return call from patient.  Unable to reach wife.

## 2013-05-22 NOTE — Telephone Encounter (Signed)
Reached patient after leaving two messages.  Asked how he is feeling with pain or swelling.  Reports he does have both.  Asked where and says in his abdomen and this started a few days ago.  Rates pain as a three to four on pain scale.  Informed him radiologist was concerned due to appendix enlarged and edema.  Instructed to go to ER and says he has an appointment with Dr. Clelia Croft in the morning.  Instructions given to report to ER and not wait till morning if pain worsens as treatment should be antibiotics and an appendectomy as an elective not urgent procedure.  Will notify provider as patient to be seen at 9:15 am.

## 2013-05-23 ENCOUNTER — Other Ambulatory Visit: Payer: BC Managed Care – PPO | Admitting: Lab

## 2013-05-23 ENCOUNTER — Encounter (HOSPITAL_COMMUNITY): Payer: Self-pay | Admitting: Emergency Medicine

## 2013-05-23 ENCOUNTER — Telehealth: Payer: Self-pay | Admitting: *Deleted

## 2013-05-23 ENCOUNTER — Other Ambulatory Visit: Payer: Self-pay

## 2013-05-23 ENCOUNTER — Encounter (HOSPITAL_COMMUNITY): Payer: BC Managed Care – PPO | Admitting: Anesthesiology

## 2013-05-23 ENCOUNTER — Encounter (HOSPITAL_COMMUNITY)
Admission: EM | Disposition: A | Discharge status: Home / Self Care | Payer: Self-pay | Source: Home / Self Care | Attending: General Surgery

## 2013-05-23 ENCOUNTER — Ambulatory Visit: Payer: BC Managed Care – PPO | Admitting: Oncology

## 2013-05-23 ENCOUNTER — Inpatient Hospital Stay (HOSPITAL_COMMUNITY): Payer: BC Managed Care – PPO | Admitting: Anesthesiology

## 2013-05-23 ENCOUNTER — Inpatient Hospital Stay (HOSPITAL_COMMUNITY)
Admission: EM | Admit: 2013-05-23 | Discharge: 2013-05-24 | DRG: 883 | Disposition: A | Discharge status: Home / Self Care | Payer: BC Managed Care – PPO | Attending: Surgery | Admitting: Surgery

## 2013-05-23 ENCOUNTER — Ambulatory Visit (HOSPITAL_BASED_OUTPATIENT_CLINIC_OR_DEPARTMENT_OTHER): Payer: BC Managed Care – PPO | Admitting: Oncology

## 2013-05-23 ENCOUNTER — Telehealth: Payer: Self-pay | Admitting: Oncology

## 2013-05-23 ENCOUNTER — Ambulatory Visit: Payer: BC Managed Care – PPO

## 2013-05-23 VITALS — BP 154/55 | HR 85 | Temp 97.8°F | Resp 20 | Ht 73.0 in | Wt 265.2 lb

## 2013-05-23 DIAGNOSIS — K37 Unspecified appendicitis: Secondary | ICD-10-CM | POA: Diagnosis present

## 2013-05-23 DIAGNOSIS — E119 Type 2 diabetes mellitus without complications: Secondary | ICD-10-CM

## 2013-05-23 DIAGNOSIS — C8588 Other specified types of non-Hodgkin lymphoma, lymph nodes of multiple sites: Secondary | ICD-10-CM

## 2013-05-23 DIAGNOSIS — E1165 Type 2 diabetes mellitus with hyperglycemia: Secondary | ICD-10-CM | POA: Diagnosis present

## 2013-05-23 DIAGNOSIS — D72829 Elevated white blood cell count, unspecified: Secondary | ICD-10-CM | POA: Diagnosis present

## 2013-05-23 DIAGNOSIS — R739 Hyperglycemia, unspecified: Secondary | ICD-10-CM | POA: Diagnosis present

## 2013-05-23 DIAGNOSIS — E785 Hyperlipidemia, unspecified: Secondary | ICD-10-CM | POA: Diagnosis present

## 2013-05-23 DIAGNOSIS — D709 Neutropenia, unspecified: Secondary | ICD-10-CM

## 2013-05-23 DIAGNOSIS — R161 Splenomegaly, not elsewhere classified: Secondary | ICD-10-CM | POA: Diagnosis present

## 2013-05-23 DIAGNOSIS — C8590 Non-Hodgkin lymphoma, unspecified, unspecified site: Secondary | ICD-10-CM

## 2013-05-23 DIAGNOSIS — K389 Disease of appendix, unspecified: Secondary | ICD-10-CM

## 2013-05-23 DIAGNOSIS — K3580 Unspecified acute appendicitis: Secondary | ICD-10-CM

## 2013-05-23 DIAGNOSIS — C8310 Mantle cell lymphoma, unspecified site: Secondary | ICD-10-CM | POA: Diagnosis present

## 2013-05-23 DIAGNOSIS — IMO0002 Reserved for concepts with insufficient information to code with codable children: Secondary | ICD-10-CM | POA: Diagnosis present

## 2013-05-23 HISTORY — PX: LAPAROSCOPIC APPENDECTOMY: SHX408

## 2013-05-23 LAB — CBC WITH DIFFERENTIAL/PLATELET
Basophils Absolute: 0 K/uL (ref 0.0–0.1)
Basophils Relative: 0 % (ref 0–1)
Eosinophils Absolute: 0.1 K/uL (ref 0.0–0.7)
Eosinophils Relative: 1 % (ref 0–5)
HCT: 35.6 % — ABNORMAL LOW (ref 39.0–52.0)
Hemoglobin: 12.5 g/dL — ABNORMAL LOW (ref 13.0–17.0)
Lymphocytes Relative: 4 % — ABNORMAL LOW (ref 12–46)
Lymphs Abs: 0.5 K/uL — ABNORMAL LOW (ref 0.7–4.0)
MCH: 30.8 pg (ref 26.0–34.0)
MCHC: 35.1 g/dL (ref 30.0–36.0)
MCV: 87.7 fL (ref 78.0–100.0)
Monocytes Absolute: 0.5 K/uL (ref 0.1–1.0)
Monocytes Relative: 4 % (ref 3–12)
Neutro Abs: 10.4 K/uL — ABNORMAL HIGH (ref 1.7–7.7)
Neutrophils Relative %: 90 % — ABNORMAL HIGH (ref 43–77)
Platelets: 216 K/uL (ref 150–400)
RBC: 4.06 MIL/uL — ABNORMAL LOW (ref 4.22–5.81)
RDW: 15 % (ref 11.5–15.5)
WBC: 11.5 K/uL — ABNORMAL HIGH (ref 4.0–10.5)

## 2013-05-23 LAB — BASIC METABOLIC PANEL
BUN: 27 mg/dL — ABNORMAL HIGH (ref 6–23)
Calcium: 9.2 mg/dL (ref 8.4–10.5)
Creatinine, Ser: 0.91 mg/dL (ref 0.50–1.35)
GFR calc non Af Amer: >90 mL/min (ref >90)
Glucose, Bld: 379 mg/dL — ABNORMAL HIGH (ref 70–99)
Sodium: 134 mEq/L — ABNORMAL LOW (ref 135–145)

## 2013-05-23 LAB — PROTIME-INR
INR: 0.92 (ref 0.00–1.49)
Prothrombin Time: 12.2 seconds (ref 11.6–15.2)

## 2013-05-23 LAB — APTT: aPTT: 27 seconds (ref 24–37)

## 2013-05-23 LAB — GLUCOSE, CAPILLARY
Glucose-Capillary: 290 mg/dL — ABNORMAL HIGH (ref 70–99)
Glucose-Capillary: 223 mg/dL — ABNORMAL HIGH (ref 70–99)

## 2013-05-23 LAB — HEMOGLOBIN A1C: Mean Plasma Glucose: 260 mg/dL — ABNORMAL HIGH (ref <117)

## 2013-05-23 SURGERY — APPENDECTOMY, LAPAROSCOPIC
Anesthesia: General | Site: Abdomen | Wound class: Contaminated

## 2013-05-23 MED ORDER — POTASSIUM CHLORIDE IN NACL 20-0.45 MEQ/L-% IV SOLN
INTRAVENOUS | Status: DC
  Administered 2013-05-23: 100 mL/h via INTRAVENOUS
  Administered 2013-05-24: 04:00:00 via INTRAVENOUS
  Filled 2013-05-23 (×3): qty 1000

## 2013-05-23 MED ORDER — HEPARIN SODIUM (PORCINE) 5000 UNIT/ML IJ SOLN
5000.0000 [IU] | Freq: Three times a day (TID) | INTRAMUSCULAR | Status: DC
  Administered 2013-05-23 – 2013-05-24 (×2): 5000 [IU] via SUBCUTANEOUS
  Filled 2013-05-23 (×5): qty 1

## 2013-05-23 MED ORDER — LIDOCAINE HCL (CARDIAC) 20 MG/ML IV SOLN
INTRAVENOUS | Status: DC | PRN
  Administered 2013-05-23: 100 mg via INTRAVENOUS

## 2013-05-23 MED ORDER — MORPHINE SULFATE 2 MG/ML IJ SOLN
1.0000 mg | INTRAMUSCULAR | Status: DC | PRN
  Administered 2013-05-23: 3 mg via INTRAVENOUS
  Administered 2013-05-23: 2 mg via INTRAVENOUS
  Administered 2013-05-23: 1 mg via INTRAVENOUS
  Administered 2013-05-24 (×2): 3 mg via INTRAVENOUS
  Filled 2013-05-23: qty 2
  Filled 2013-05-23: qty 1
  Filled 2013-05-23: qty 2
  Filled 2013-05-23: qty 1
  Filled 2013-05-23: qty 2

## 2013-05-23 MED ORDER — SUCCINYLCHOLINE CHLORIDE 20 MG/ML IJ SOLN
INTRAMUSCULAR | Status: DC | PRN
  Administered 2013-05-23: 100 mg via INTRAVENOUS

## 2013-05-23 MED ORDER — SODIUM CHLORIDE 0.9 % IV BOLUS (SEPSIS)
1000.0000 mL | Freq: Once | INTRAVENOUS | Status: AC
  Administered 2013-05-23: 1000 mL via INTRAVENOUS

## 2013-05-23 MED ORDER — HYDROCODONE-ACETAMINOPHEN 5-325 MG PO TABS
1.0000 | ORAL_TABLET | ORAL | Status: DC | PRN
  Administered 2013-05-23 – 2013-05-24 (×4): 2 via ORAL
  Filled 2013-05-23 (×4): qty 2

## 2013-05-23 MED ORDER — INSULIN ASPART 100 UNIT/ML ~~LOC~~ SOLN
0.0000 [IU] | Freq: Three times a day (TID) | SUBCUTANEOUS | Status: DC
  Administered 2013-05-23: 11 [IU] via SUBCUTANEOUS
  Administered 2013-05-23: 5 [IU] via SUBCUTANEOUS
  Administered 2013-05-24: 3 [IU] via SUBCUTANEOUS

## 2013-05-23 MED ORDER — DEXTROSE 5 % IV SOLN
1.0000 g | Freq: Four times a day (QID) | INTRAVENOUS | Status: AC
  Administered 2013-05-23 – 2013-05-24 (×3): 1 g via INTRAVENOUS
  Filled 2013-05-23 (×3): qty 1

## 2013-05-23 MED ORDER — IBUPROFEN 600 MG PO TABS
600.0000 mg | ORAL_TABLET | Freq: Four times a day (QID) | ORAL | Status: DC | PRN
  Filled 2013-05-23: qty 1

## 2013-05-23 MED ORDER — HYDROMORPHONE HCL PF 1 MG/ML IJ SOLN
0.2500 mg | INTRAMUSCULAR | Status: DC | PRN
  Administered 2013-05-23 (×2): 0.5 mg via INTRAVENOUS

## 2013-05-23 MED ORDER — DEXTROSE 5 % IV SOLN
2.0000 g | Freq: Once | INTRAVENOUS | Status: AC
  Administered 2013-05-23: 2 g via INTRAVENOUS
  Filled 2013-05-23: qty 2

## 2013-05-23 MED ORDER — MIDAZOLAM HCL 5 MG/5ML IJ SOLN
INTRAMUSCULAR | Status: DC | PRN
  Administered 2013-05-23: 2 mg via INTRAVENOUS

## 2013-05-23 MED ORDER — MEPERIDINE HCL 50 MG/ML IJ SOLN: 6.2500 mg | INTRAMUSCULAR | Status: DC | PRN

## 2013-05-23 MED ORDER — NEOSTIGMINE METHYLSULFATE 1 MG/ML IJ SOLN
INTRAMUSCULAR | Status: DC | PRN
  Administered 2013-05-23: 4 mg via INTRAVENOUS

## 2013-05-23 MED ORDER — SODIUM CHLORIDE 0.9 % IV SOLN: INTRAVENOUS | Status: DC

## 2013-05-23 MED ORDER — FENTANYL CITRATE 0.05 MG/ML IJ SOLN
INTRAMUSCULAR | Status: DC | PRN
  Administered 2013-05-23 (×3): 50 ug via INTRAVENOUS
  Administered 2013-05-23: 150 ug via INTRAVENOUS
  Administered 2013-05-23: 50 ug via INTRAVENOUS

## 2013-05-23 MED ORDER — BUPIVACAINE HCL (PF) 0.25 % IJ SOLN
INTRAMUSCULAR | Status: DC | PRN
  Administered 2013-05-23: 30 mL

## 2013-05-23 MED ORDER — OXYCODONE HCL 5 MG PO TABS: 5.0000 mg | ORAL_TABLET | Freq: Once | ORAL | Status: DC | PRN

## 2013-05-23 MED ORDER — INSULIN ASPART 100 UNIT/ML ~~LOC~~ SOLN
SUBCUTANEOUS | Status: AC
  Filled 2013-05-23: qty 1

## 2013-05-23 MED ORDER — INSULIN NPH (HUMAN) (ISOPHANE) 100 UNIT/ML ~~LOC~~ SUSP
10.0000 [IU] | Freq: Two times a day (BID) | SUBCUTANEOUS | Status: DC
  Administered 2013-05-23 – 2013-05-24 (×2): 10 [IU] via SUBCUTANEOUS
  Filled 2013-05-23: qty 10

## 2013-05-23 MED ORDER — DEXTROSE 5 % IV SOLN
INTRAVENOUS | Status: AC
  Filled 2013-05-23 (×2): qty 1

## 2013-05-23 MED ORDER — ONDANSETRON HCL 4 MG/2ML IJ SOLN
INTRAMUSCULAR | Status: DC | PRN
  Administered 2013-05-23: 4 mg via INTRAMUSCULAR

## 2013-05-23 MED ORDER — PROPOFOL 10 MG/ML IV BOLUS
INTRAVENOUS | Status: DC | PRN
  Administered 2013-05-23: 200 mg via INTRAVENOUS

## 2013-05-23 MED ORDER — ESMOLOL HCL 10 MG/ML IV SOLN
INTRAVENOUS | Status: DC | PRN
  Administered 2013-05-23: 30 mg via INTRAVENOUS

## 2013-05-23 MED ORDER — LACTATED RINGERS IV SOLN
INTRAVENOUS | Status: DC | PRN
  Administered 2013-05-23 (×2): via INTRAVENOUS

## 2013-05-23 MED ORDER — LACTATED RINGERS IR SOLN
Status: DC | PRN
  Administered 2013-05-23: 1000 mL

## 2013-05-23 MED ORDER — OXYCODONE HCL 5 MG/5ML PO SOLN
5.0000 mg | Freq: Once | ORAL | Status: DC | PRN
  Filled 2013-05-23: qty 5

## 2013-05-23 MED ORDER — ALBUTEROL SULFATE (5 MG/ML) 0.5% IN NEBU: 2.5000 mg | INHALATION_SOLUTION | Freq: Four times a day (QID) | RESPIRATORY_TRACT | Status: DC | PRN

## 2013-05-23 MED ORDER — PROMETHAZINE HCL 25 MG/ML IJ SOLN
6.2500 mg | INTRAMUSCULAR | Status: DC | PRN
  Administered 2013-05-23: 12.5 mg via INTRAVENOUS

## 2013-05-23 MED ORDER — PROMETHAZINE HCL 25 MG/ML IJ SOLN
INTRAMUSCULAR | Status: AC
  Filled 2013-05-23: qty 1

## 2013-05-23 MED ORDER — GLYCOPYRROLATE 0.2 MG/ML IJ SOLN
INTRAMUSCULAR | Status: DC | PRN
  Administered 2013-05-23: .6 mg via INTRAVENOUS

## 2013-05-23 MED ORDER — ROCURONIUM BROMIDE 100 MG/10ML IV SOLN
INTRAVENOUS | Status: DC | PRN
  Administered 2013-05-23 (×2): 10 mg via INTRAVENOUS
  Administered 2013-05-23: 50 mg via INTRAVENOUS
  Administered 2013-05-23: 5 mg via INTRAVENOUS

## 2013-05-23 MED ORDER — HYDROMORPHONE HCL PF 1 MG/ML IJ SOLN
INTRAMUSCULAR | Status: AC
  Filled 2013-05-23: qty 1

## 2013-05-23 MED ORDER — SIMVASTATIN 10 MG PO TABS
10.0000 mg | ORAL_TABLET | Freq: Every day | ORAL | Status: DC
  Filled 2013-05-23: qty 1

## 2013-05-23 MED ORDER — BUPIVACAINE HCL (PF) 0.25 % IJ SOLN
INTRAMUSCULAR | Status: AC
  Filled 2013-05-23: qty 30

## 2013-05-23 SURGICAL SUPPLY — 42 items
ADH SKN CLS APL DERMABOND .7 (GAUZE/BANDAGES/DRESSINGS) ×1
APL SKNCLS STERI-STRIP NONHPOA (GAUZE/BANDAGES/DRESSINGS) ×1
APPLIER CLIP ROT 10 11.4 M/L (STAPLE)
APR CLP MED LRG 11.4X10 (STAPLE)
BAG SPEC RTRVL LRG 6X4 10 (ENDOMECHANICALS) ×1
BENZOIN TINCTURE PRP APPL 2/3 (GAUZE/BANDAGES/DRESSINGS) ×2 IMPLANT
CANISTER SUCTION 2500CC (MISCELLANEOUS) ×2 IMPLANT
CLIP APPLIE ROT 10 11.4 M/L (STAPLE) IMPLANT
CLOTH BEACON ORANGE TIMEOUT ST (SAFETY) ×2 IMPLANT
CUTTER FLEX LINEAR 45M (STAPLE) ×1 IMPLANT
DECANTER SPIKE VIAL GLASS SM (MISCELLANEOUS) ×2 IMPLANT
DERMABOND ADVANCED (GAUZE/BANDAGES/DRESSINGS) ×1
DERMABOND ADVANCED .7 DNX12 (GAUZE/BANDAGES/DRESSINGS) ×1 IMPLANT
DRAPE LAPAROSCOPIC ABDOMINAL (DRAPES) ×2 IMPLANT
ELECT REM PT RETURN 9FT ADLT (ELECTROSURGICAL) ×2
ELECTRODE REM PT RTRN 9FT ADLT (ELECTROSURGICAL) ×1 IMPLANT
ENDOLOOP SUT PDS II  0 18 (SUTURE)
ENDOLOOP SUT PDS II 0 18 (SUTURE) IMPLANT
GLOVE BIOGEL PI IND STRL 7.0 (GLOVE) ×1 IMPLANT
GLOVE BIOGEL PI INDICATOR 7.0 (GLOVE) ×1
GLOVE SURG SIGNA 7.5 PF LTX (GLOVE) ×2 IMPLANT
GOWN PREVENTION PLUS LG XLONG (DISPOSABLE) ×2 IMPLANT
GOWN STRL REIN XL XLG (GOWN DISPOSABLE) ×4 IMPLANT
KIT BASIN OR (CUSTOM PROCEDURE TRAY) ×2 IMPLANT
PENCIL BUTTON HOLSTER BLD 10FT (ELECTRODE) IMPLANT
POUCH SPECIMEN RETRIEVAL 10MM (ENDOMECHANICALS) ×2 IMPLANT
RELOAD 45 VASCULAR/THIN (ENDOMECHANICALS) ×2 IMPLANT
RELOAD STAPLE 45 2.5 WHT GRN (ENDOMECHANICALS) IMPLANT
RELOAD STAPLE TA45 3.5 REG BLU (ENDOMECHANICALS) ×2 IMPLANT
SCALPEL HARMONIC ACE (MISCELLANEOUS) ×2 IMPLANT
SET IRRIG TUBING LAPAROSCOPIC (IRRIGATION / IRRIGATOR) ×2 IMPLANT
SOLUTION ANTI FOG 6CC (MISCELLANEOUS) ×2 IMPLANT
STRIP CLOSURE SKIN 1/2X4 (GAUZE/BANDAGES/DRESSINGS) ×2 IMPLANT
SUT MON AB 5-0 PS2 18 (SUTURE) ×2 IMPLANT
TOWEL OR 17X26 10 PK STRL BLUE (TOWEL DISPOSABLE) ×2 IMPLANT
TOWEL OR NON WOVEN STRL DISP B (DISPOSABLE) ×2 IMPLANT
TRAY FOLEY CATH 14FRSI W/METER (CATHETERS) ×1 IMPLANT
TRAY LAP CHOLE (CUSTOM PROCEDURE TRAY) ×2 IMPLANT
TROCAR BLADELESS OPT 5 100 (ENDOMECHANICALS) ×2 IMPLANT
TROCAR XCEL BLUNT TIP 100MML (ENDOMECHANICALS) ×2 IMPLANT
TROCAR XCEL NON-BLD 11X100MML (ENDOMECHANICALS) ×2 IMPLANT
TUBING INSUFFLATION 10FT LAP (TUBING) ×2 IMPLANT

## 2013-05-23 NOTE — ED Notes (Addendum)
Pt from home sent here from cancer center Dr. He was informed that his appendix is "about to rupture". He is having right lower abdominal pain x 3 days positive n/v/. Last meal  At 9am ( 2 sausage biscuits) Pt drinking coke just prior to arrival

## 2013-05-23 NOTE — Transfer of Care (Signed)
Immediate Anesthesia Transfer of Care Note  Patient: Micheal Thompson  Procedure(s) Performed: Procedure(s): APPENDECTOMY LAPAROSCOPIC (N/A)  Patient Location: PACU  Anesthesia Type:General  Level of Consciousness: awake, alert  and oriented  Airway & Oxygen Therapy: Patient Spontanous Breathing and Patient connected to face mask oxygen  Post-op Assessment: Report given to PACU RN and Post -op Vital signs reviewed and stable  Post vital signs: Reviewed and stable  Complications: No apparent anesthesia complications

## 2013-05-23 NOTE — Consult Note (Signed)
Micheal Thompson Jun 28, 1951  [id number].    Primary MD:  Dr. Waynard Edwards Requesting MD: Dr. Patria Mane Chief Complaint/Reason for Consult: acute appendicitis HPI:  62 y/o male with PMH non-Hodgkin's lymphoma on chemotherapy, IDDM, COPD presents to [facility] complaining of 3 days of worsening RLQ abdominal pain and nausea.  He notes he had some discomfort 2-3 weeks ago which resolved after a few days and then returned in the same location over the last few days.  He was seen by his oncologist yesterday for his 4th dose of chemo.  He was sent for an OP CT scan which was consistent with acute appendicitis.  He was instructed to go to the ED yesterday, but waiting until today to be seen at Dr. Alver Fisher office visit.  He denies vomiting, fever/chills, CP/SOB.    Of note his is currently receiving chemotherapy and yesterday would have been his 4th round.  Dr. Clelia Croft is his oncologist.  He had his GB removed 25-30 years ago done open.  He has a h/o of a left groin hernia and an incisional hernia on the right paramidline from his cholecystectomy, but is asymptomatic.  He has seen Dr. Corliss Skains in the office for possible splenectomy, but the decision was made against surgery.  ROS: All systems reviewed and otherwise negative except for as above  Family History  Problem Relation Age of Onset  . Cancer Maternal Grandmother     unknown  . Heart attack Brother     Past Medical History  Diagnosis Date  . H/O colonoscopy 01/13/2013  . Hyperlipidemia   . Diabetes mellitus without complication   . Hypertension   . Depression   . H/O Bell's palsy   . Pancarditis   . ED (erectile dysfunction)   . Colon polyps   . COPD (chronic obstructive pulmonary disease)   . GERD (gastroesophageal reflux disease)   . Non Hodgkin's lymphoma     Past Surgical History  Procedure Laterality Date  . Hernia repair    . Shoulder arthroscopy    . Cholecystectomy      Social History:  reports that he quit smoking about 29 years ago.  He has never used smokeless tobacco. He reports that he does not drink alcohol or use illicit drugs.  Allergies:  Allergies  Allergen Reactions  . Penicillins Other (See Comments)    unknown     (Not in a hospital admission)  Blood pressure 136/68, pulse 76, temperature 98.6 F (37 C), temperature source Oral, resp. rate 18, height 6\' 1"  (1.854 m), weight 256 lb (116.121 kg), SpO2 96.00%. Physical Exam: General: pleasant, WD/WN obese white male who is laying in bed in NAD HEENT: head is normocephalic, atraumatic.  Sclera are noninjected.  PERRL.  Ears and nose without any masses or lesions.  Mouth is pink and moist Heart: regular, rate, and rhythm.  No obvious murmurs, gallops, or rubs noted.  Palpable pedal pulses bilaterally Lungs: CTAB, no wheezes, rhonchi, or rales noted.  Respiratory effort nonlabored Abd: obese, soft, tenderness in RLQ, ND, +BS, right cholecystectomy incisional hernia (paramidline) well healed incision MS: all 4 extremities are symmetrical with no cyanosis, clubbing, or edema. Skin: warm and dry with no masses, lesions, or rashes Psych: A&Ox3 with an appropriate affect.  Results for orders placed during the hospital encounter of 05/23/13 (from the past 48 hour(s))  CBC WITH DIFFERENTIAL     Status: Abnormal   Collection Time    05/23/13 10:50 AM      Result Value  Range   WBC 11.5 (*) 4.0 - 10.5 K/uL   RBC 4.06 (*) 4.22 - 5.81 MIL/uL   Hemoglobin 12.5 (*) 13.0 - 17.0 g/dL   HCT 40.9 (*) 81.1 - 91.4 %   MCV 87.7  78.0 - 100.0 fL   MCH 30.8  26.0 - 34.0 pg   MCHC 35.1  30.0 - 36.0 g/dL   RDW 78.2  95.6 - 21.3 %   Platelets 216  150 - 400 K/uL   Neutrophils Relative % 90 (*) 43 - 77 %   Neutro Abs 10.4 (*) 1.7 - 7.7 K/uL   Lymphocytes Relative 4 (*) 12 - 46 %   Lymphs Abs 0.5 (*) 0.7 - 4.0 K/uL   Monocytes Relative 4  3 - 12 %   Monocytes Absolute 0.5  0.1 - 1.0 K/uL   Eosinophils Relative 1  0 - 5 %   Eosinophils Absolute 0.1  0.0 - 0.7 K/uL    Basophils Relative 0  0 - 1 %   Basophils Absolute 0.0  0.0 - 0.1 K/uL  BASIC METABOLIC PANEL     Status: Abnormal   Collection Time    05/23/13 10:50 AM      Result Value Range   Sodium 134 (*) 135 - 145 mEq/L   Potassium 4.2  3.5 - 5.1 mEq/L   Chloride 98  96 - 112 mEq/L   CO2 24  19 - 32 mEq/L   Glucose, Bld 379 (*) 70 - 99 mg/dL   BUN 27 (*) 6 - 23 mg/dL   Creatinine, Ser 0.86  0.50 - 1.35 mg/dL   Calcium 9.2  8.4 - 57.8 mg/dL   GFR calc non Af Amer >90  >90 mL/min   GFR calc Af Amer >90  >90 mL/min   Comment: (NOTE)     The eGFR has been calculated using the CKD EPI equation.     This calculation has not been validated in all clinical situations.     eGFR's persistently <90 mL/min signify possible Chronic Kidney     Disease.  APTT     Status: None   Collection Time    05/23/13 10:50 AM      Result Value Range   aPTT 27  24 - 37 seconds  PROTIME-INR     Status: None   Collection Time    05/23/13 10:50 AM      Result Value Range   Prothrombin Time 12.2  11.6 - 15.2 seconds   INR 0.92  0.00 - 1.49   Ct Chest W Contrast  05/22/2013   CLINICAL DATA:  Non-Hodgkin's lymphoma.  EXAM: CT CHEST, ABDOMEN, AND PELVIS WITH CONTRAST  TECHNIQUE: Multidetector CT imaging of the chest, abdomen and pelvis was performed following the standard protocol during bolus administration of intravenous contrast.  CONTRAST:  OMNIPAQUE IOHEXOL 300 MG/ML  SOLN  COMPARISON:  None.  FINDINGS: CT CHEST FINDINGS  There is no axillary lymphadenopathy. 12 mm short axis right peritracheal lymph node has a fatty hilum. No other mediastinal lymphadenopathy. No hilar lymphadenopathy. Heart size is normal. Coronary artery calcification is noted. There is no pericardial or pleural effusion.  Lung windows shows some bronchial wall thickening. No focal airspace consolidation. No pulmonary parenchymal nodule or mass.  Bone windows reveal no worrisome lytic or sclerotic osseous lesions.  CT ABDOMEN AND PELVIS  FINDINGS  The liver measures 23.5 cm in craniocaudal length, enlarged. The spleen is enlarged at 16.4 cm. No focal abnormality is seen  within the liver or spleen on this study performed without intravenous contrast material. The stomach, duodenum, pancreas, the and adrenal glands are unremarkable. 1.9 cm exophytic water density lesion from the upper pole of the right kidney is compatible with a cyst. The left kidney is unremarkable.  Right paramidline fashion defect about 15 cm cranial to the umbilicus contains herniated omental fat.  No abdominal aortic aneurysm. There is no free fluid or lymphadenopathy in the abdomen.  Imaging through the pelvis shows no free intraperitoneal fluid. No pelvic sidewall lymphadenopathy. Bladder is unremarkable. Scattered diverticular change is noted in the sigmoid colon without evidence for diverticulitis. Terminal ileum is unremarkable.  There is soft tissue stranding around the appendix with apparent associated edema within the adjacent mesenteric. Several gas loculation are visualized within the appendiceal lumen.  Bone windows reveal no worrisome lytic or sclerotic osseous lesions.  IMPRESSION: No acute findings in the chest.  Hepatosplenomegaly.  Appendix is technically dilated and has an ill-defined wall with possible periappendiceal and adjacent mesenteric edema. Acute appendicitis cannot be excluded by imaging.  Right paramidline ventral hernia.  Exophytic right renal cyst.  I called these results to Dr. Alver Fisher nurse, Geri Seminole (as he was out of the office) at 1300 hr on 05/22/2013.   Electronically Signed   By: Kennith Center M.D.   On: 05/22/2013 13:01   Ct Abdomen Pelvis W Contrast  05/22/2013   CLINICAL DATA:  Non-Hodgkin's lymphoma.  EXAM: CT CHEST, ABDOMEN, AND PELVIS WITH CONTRAST  TECHNIQUE: Multidetector CT imaging of the chest, abdomen and pelvis was performed following the standard protocol during bolus administration of intravenous contrast.  CONTRAST:   OMNIPAQUE IOHEXOL 300 MG/ML  SOLN  COMPARISON:  None.  FINDINGS: CT CHEST FINDINGS  There is no axillary lymphadenopathy. 12 mm short axis right peritracheal lymph node has a fatty hilum. No other mediastinal lymphadenopathy. No hilar lymphadenopathy. Heart size is normal. Coronary artery calcification is noted. There is no pericardial or pleural effusion.  Lung windows shows some bronchial wall thickening. No focal airspace consolidation. No pulmonary parenchymal nodule or mass.  Bone windows reveal no worrisome lytic or sclerotic osseous lesions.  CT ABDOMEN AND PELVIS FINDINGS  The liver measures 23.5 cm in craniocaudal length, enlarged. The spleen is enlarged at 16.4 cm. No focal abnormality is seen within the liver or spleen on this study performed without intravenous contrast material. The stomach, duodenum, pancreas, the and adrenal glands are unremarkable. 1.9 cm exophytic water density lesion from the upper pole of the right kidney is compatible with a cyst. The left kidney is unremarkable.  Right paramidline fashion defect about 15 cm cranial to the umbilicus contains herniated omental fat.  No abdominal aortic aneurysm. There is no free fluid or lymphadenopathy in the abdomen.  Imaging through the pelvis shows no free intraperitoneal fluid. No pelvic sidewall lymphadenopathy. Bladder is unremarkable. Scattered diverticular change is noted in the sigmoid colon without evidence for diverticulitis. Terminal ileum is unremarkable.  There is soft tissue stranding around the appendix with apparent associated edema within the adjacent mesenteric. Several gas loculation are visualized within the appendiceal lumen.  Bone windows reveal no worrisome lytic or sclerotic osseous lesions.  IMPRESSION: No acute findings in the chest.  Hepatosplenomegaly.  Appendix is technically dilated and has an ill-defined wall with possible periappendiceal and adjacent mesenteric edema. Acute appendicitis cannot be excluded by  imaging.  Right paramidline ventral hernia.  Exophytic right renal cyst.  I called these results to Dr. Alver Fisher nurse,  Geri Seminole (as he was out of the office) at 1300 hr on 05/22/2013.   Electronically Signed   By: Kennith Center M.D.   On: 05/22/2013 13:01       Assessment/Plan Acute appendicitis Non-Hodgkin's lymphoma DM, insulin dependent Non-oxygen dependent COPD 2* smoking hx  Plan: 1.  Admit to [facility], OR for urgent lap appy with possible open at 1pm since he last ate around 9am 2.  NPO, IVF, pain control, antiemetics, already received 1 dose of antibiotics 3.  Appreciate Dr. Charlean Sanfilippo help 4.  Discussed his care with Dr. Clelia Croft who encourages [place] to proceed   Aris Georgia 05/23/2013, 11:51 AM Pager: [phone]  I spoke to Dr. Clelia Croft about the surgery.  He sees Dr. Burnell Blanks from a medical standpoint. I discussed with the patient the indications and risks of appendiceal surgery.  The primary risks of appendiceal surgery include, but are not limited to, bleeding, infection, bowel surgery, and open surgery.  There is also the risk that the patient may have continued symptoms after surgery.  However, the likelihood of improvement in symptoms and return to the patient's normal status is good. We discussed the typical post-operative recovery course. I tried to answer the patient's questions.  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  He retired 4 months ago when he was diagnosed with NHL.  [name], MD, [facility] Surgery Pager: [phone] Office phone:  [phone]

## 2013-05-23 NOTE — Anesthesia Preprocedure Evaluation (Signed)
Anesthesia Evaluation  Patient identified by MRN, date of birth, ID band Patient awake    Reviewed: Allergy & Precautions, H&P , NPO status , Patient's Chart, lab work & pertinent test results  Airway Mallampati: II TM Distance: >3 FB Neck ROM: Full    Dental  (+) Dental Advisory Given   Pulmonary COPD COPD inhaler,  breath sounds clear to auscultation        Cardiovascular hypertension, Pt. on medications Rhythm:Regular Rate:Normal     Neuro/Psych PSYCHIATRIC DISORDERS Depression negative neurological ROS     GI/Hepatic Neg liver ROS, GERD-  Medicated,  Endo/Other  diabetes, Type 2, Oral Hypoglycemic Agents  Renal/GU negative Renal ROS     Musculoskeletal negative musculoskeletal ROS (+)   Abdominal   Peds  Hematology  (+) Blood dyscrasia, ,   Anesthesia Other Findings   Reproductive/Obstetrics negative OB ROS                           Anesthesia Physical Anesthesia Plan  ASA: III and emergent  Anesthesia Plan: General   Post-op Pain Management:    Induction: Intravenous, Rapid sequence and Cricoid pressure planned  Airway Management Planned: Oral ETT  Additional Equipment:   Intra-op Plan:   Post-operative Plan: Extubation in OR  Informed Consent: I have reviewed the patients History and Physical, chart, labs and discussed the procedure including the risks, benefits and alternatives for the proposed anesthesia with the patient or authorized representative who has indicated his/her understanding and acceptance.   Dental advisory given  Plan Discussed with: CRNA  Anesthesia Plan Comments:         Anesthesia Quick Evaluation

## 2013-05-23 NOTE — ED Notes (Addendum)
[facility] made aware that Pt is going to surgery, instead of the floor.  Pt's valuables taken to security and key given to OR Tech.

## 2013-05-23 NOTE — Progress Notes (Signed)
Hematology and Oncology Follow Up Visit  WE[facility] BOOMERSHINE [id number] [birth year] 61 y.o. 05/23/2013 10:36 AM   Principle Diagnosis: 62 year old gentleman  presented with splenomegaly and lymphadenopathy. His workup showed mantle cell lymphoma diagnosed in July of 2014.  Current therapy: Chemotherapy with  bendamustine and rituximab started on 02/28/2013. He is here for cycle 4.   Interim History: Micheal Thompson presents today for a followup visit. He is a pleasant 63 year old gentleman with mantle cell lymphoma. He underwent the first 3 cycles of B-R chemotherapy without complications. He did have a minor infusion reaction the first treatment but overall did well. In the last few days, he reported right lower quadrant pain. Low grade fever and mild nausea. He still fatigued but his night sweats are improved. He is gaining more weight now. He reports less abdominal fullness and early satiety. He reports that his left upper quadrant pain that was present at the time of diagnosis has significantly improved.  Medications: I have reviewed the patient's current medications.  No current facility-administered medications for this visit.   Current Outpatient Prescriptions  Medication Sig Dispense Refill  . docusate sodium (COLACE) 100 MG capsule Take 100 mg by mouth daily.      Marland Kitchen HYDROcodone-acetaminophen (NORCO/VICODIN) 5-325 MG per tablet Take 1 tablet by mouth every 6 (six) hours as needed for pain.      Marland Kitchen insulin NPH (HUMULIN N,NOVOLIN N) 100 UNIT/ML injection Inject 40 Units into the skin 2 (two) times daily at 8 am and 10 pm.      . insulin regular (NOVOLIN R,HUMULIN R) 100 units/mL injection Inject 5-30 Units into the skin 3 (three) times daily before meals. Sliding scale 81-120= 5 units, 121-140= 7 units, 141-180-=10 units, 181-240=12 units, 241-280=15 unites, 281-340=22 units, 341-400=25 units, >400=30 units      . lovastatin (MEVACOR) 20 MG tablet Take 20 mg by mouth at bedtime.      . Multiple  Vitamin (MULTIVITAMIN WITH MINERALS) TABS Take 1 tablet by mouth daily.      . polyethylene glycol (MIRALAX / GLYCOLAX) packet Take 17 g by mouth daily.      . promethazine (PHENERGAN) 25 MG tablet Take 1 tablet (25 mg total) by mouth every 6 (six) hours as needed for nausea.  30 tablet  0  . sertraline (ZOLOFT) 50 MG tablet Take 50 mg by mouth daily at 12 noon.       . traZODone (DESYREL) 100 MG tablet Take 100 mg by mouth at bedtime.       Facility-Administered Medications Ordered in Other Visits  Medication Dose Route Frequency Provider Last Rate Last Dose  . sodium chloride 0.9 % bolus 1,000 mL  1,000 mL Intravenous Once Lyanne Co, MD         Allergies:  Allergies  Allergen Reactions  . Penicillins Other (See Comments)    unknown    Past Medical History, Surgical history, Social history, and Family History were reviewed and updated.  Review of Systems:  Remaining ROS negative. Physical Exam: Blood pressure 154/55, pulse 85, temperature 97.8 F (36.6 C), temperature source Oral, resp. rate 20, height 6\' 1"  (1.854 m), weight 265 lb 3.2 oz (120.294 kg). ECOG:1  General appearance: alert Head: Normocephalic, without obvious abnormality, atraumatic Neck: no adenopathy, no carotid bruit, no JVD, supple, symmetrical, trachea midline and thyroid not enlarged, symmetric, no tenderness/mass/nodules Lymph nodes: Cervical, supraclavicular, and axillary nodes normal. Heart:regular rate and rhythm, S1, S2 normal, no murmur, click, rub or gallop Lung:chest clear,  no wheezing, rales, normal symmetric air entry Abdomin: Right lower quadrant point tenderness without any rebound or guarding. His spleen was dramatically decreased in size. EXT:no erythema, induration, or nodules   Lab Results: Lab Results  Component Value Date   WBC 7.6 05/22/2013   HGB 12.6* 05/22/2013   HCT 35.9* 05/22/2013   MCV 87.1 05/22/2013   PLT 188 05/22/2013     Chemistry      Component Value Date/Time    NA 137 05/22/2013 0942   NA 139 08/22/2009 2117   K 4.3 05/22/2013 0942   K 4.2 08/22/2009 2117   CL 102 08/22/2009 2117   CO2 23 05/22/2013 0942   CO2 23 08/22/2009 2117   BUN 20.6 05/22/2013 0942   BUN 15 08/22/2009 2117   CREATININE 0.9 05/22/2013 0942   CREATININE 1.11 08/22/2009 2117      Component Value Date/Time   CALCIUM 9.0 05/22/2013 0942   CALCIUM 8.9 08/22/2009 2117   ALKPHOS 156* 05/22/2013 0942   ALKPHOS 115 08/22/2009 2117   AST 12 05/22/2013 0942   AST 19 08/22/2009 2117   ALT 13 05/22/2013 0942   ALT 20 08/22/2009 2117   BILITOT 0.38 05/22/2013 0942   BILITOT 0.4 08/22/2009 2117     EXAM:  CT CHEST, ABDOMEN, AND PELVIS WITH CONTRAST  TECHNIQUE:  Multidetector CT imaging of the chest, abdomen and pelvis was  performed following the standard protocol during bolus  administration of intravenous contrast.  CONTRAST: OMNIPAQUE IOHEXOL 300 MG/ML SOLN  COMPARISON: None.  FINDINGS:  CT CHEST FINDINGS  There is no axillary lymphadenopathy. 12 mm short axis right  peritracheal lymph node has a fatty hilum. No other mediastinal  lymphadenopathy. No hilar lymphadenopathy. Heart size is normal.  Coronary artery calcification is noted. There is no pericardial or  pleural effusion.  Lung windows shows some bronchial wall thickening. No focal airspace  consolidation. No pulmonary parenchymal nodule or mass.  Bone windows reveal no worrisome lytic or sclerotic osseous lesions.  CT ABDOMEN AND PELVIS FINDINGS  The liver measures 23.5 cm in craniocaudal length, enlarged. The  spleen is enlarged at 16.4 cm. No focal abnormality is seen within  the liver or spleen on this study performed without intravenous  contrast material. The stomach, duodenum, pancreas, the and adrenal  glands are unremarkable. 1.9 cm exophytic water density lesion from  the upper pole of the right kidney is compatible with a cyst. The  left kidney is unremarkable.  Right paramidline fashion defect  about 15 cm cranial to the  umbilicus contains herniated omental fat.  No abdominal aortic aneurysm. There is no free fluid or  lymphadenopathy in the abdomen.  Imaging through the pelvis shows no free intraperitoneal fluid. No  pelvic sidewall lymphadenopathy. Bladder is unremarkable. Scattered  diverticular change is noted in the sigmoid colon without evidence  for diverticulitis. Terminal ileum is unremarkable.  There is soft tissue stranding around the appendix with apparent  associated edema within the adjacent mesenteric. Several gas  loculation are visualized within the appendiceal lumen.  Bone windows reveal no worrisome lytic or sclerotic osseous lesions.  IMPRESSION:  No acute findings in the chest.  Hepatosplenomegaly.  Appendix is technically dilated and has an ill-defined wall with  possible periappendiceal and adjacent mesenteric edema. Acute  appendicitis cannot be excluded by imaging.  Right paramidline ventral hernia.  Exophytic right renal cyst.  I called these results to Dr. Alver Fisher nurse, Geri [place]in[place] (as he was out  of the office)  at 1300 hr on 05/22/2013.     Impression and Plan:  62 year old gentleman with the following issues:  1. Diagnosis of non-Hodgkin's lymphoma presented with massive splenomegaly and extensive upper abdominal lymphadenopathy with a diagnosis confirmed in July of 2014. He is status post 3 cycles of bendamustine and rituximab and the CT scan on 05/22/2013 showed overall improved disease. He is splenic measurements are dramatically decreased compared to his initial scan in June of 2014. The spleen at its largest diameter was around 22 cm now it's down to 16. The pelvic adenopathy and the caval adenopathy described in the CT scan in June of 2014 is no longer described on the current CT scan. These findings were discussed today with Micheal Thompson and the plan is to continue chemotherapy once his other issues resolve.  2. incidental finding of enlarged  and inflamed appendix with clinical findings suggesting acute appendicitis: These findings were discussed today over the phone with Dr.Tsuei who felt that this needs an immediate attention at this time. I've also discussed this case with the surgical PA at [facility] who recommended a medical admission and n.p.o. at least for the next 8 hours before surgery can be done. For that reason I have referred Micheal Thompson to the emergency department for repeat blood counts and intravenous hydration and possible medical admission as well. I discussed this case also with the ER physician and I updated him about all these findings.  3. Neutropenia: I will add Neulasta after each cycle. His white cell count is adequate and I see no contraindication to any surgical intervention at this time  2. Followup: in in about one to 2 weeks to resume chemotherapy and I will see him before the next chemotherapy cycle.  [facility], MD 10/14/201410:36 AM

## 2013-05-23 NOTE — ED Notes (Signed)
MD at bedside. 

## 2013-05-23 NOTE — ED Provider Notes (Signed)
CSN: [id number]     Arrival date & time 05/23/13  1012 History   First MD Initiated Contact with Patient 05/23/13 1018     Chief Complaint  Patient presents with  . Abdominal Pain    HPI Patient presents with 3 days of worsening right lower quadrant pain with nausea.  He denies vomiting.  No diarrhea.  He has had some anorexia.  He was however able to tolerate a sauces biscuit this morning at approximately 9 AM.  Patient was seen and evaluated by his oncologist yesterday and an outpatient CT scan was done yesterday which demonstrates possible acute appendicitis.  He was sent to the emergency department for evaluation today.  Patient reports he is point tender in his right lower quadrant reports the pain is mild to moderate in severity.  The pain is worse by palpation and movement.  He has a history of diabetes and non-oxygen-dependent COPD.  He currently is receiving chemotherapy for non-Hodgkin's lymphoma.    Past Medical History  Diagnosis Date  . H/O colonoscopy 01/13/2013  . Hyperlipidemia   . Diabetes mellitus without complication   . Hypertension   . Depression   . H/O Bell's palsy   . Pancarditis   . ED (erectile dysfunction)   . Colon polyps   . COPD (chronic obstructive pulmonary disease)   . GERD (gastroesophageal reflux disease)   . Non Hodgkin's lymphoma    Past Surgical History  Procedure Laterality Date  . Hernia repair    . Shoulder arthroscopy    . Cholecystectomy     Family History  Problem Relation Age of Onset  . Cancer Maternal Grandmother     unknown  . Heart attack Brother    History  Substance Use Topics  . Smoking status: Former Smoker    Quit date: 08/11/1983  . Smokeless tobacco: Never Used  . Alcohol Use: No    Review of Systems  All other systems reviewed and are negative.    Allergies  Penicillins  Home Medications   Current Outpatient Rx  Name  Route  Sig  Dispense  Refill  . docusate sodium (COLACE) 100 MG capsule   Oral    Take 100 mg by mouth daily.         Marland Kitchen HYDROcodone-acetaminophen (NORCO/VICODIN) 5-325 MG per tablet   Oral   Take 1 tablet by mouth every 6 (six) hours as needed for pain.         Marland Kitchen insulin NPH (HUMULIN N,NOVOLIN N) 100 UNIT/ML injection   Subcutaneous   Inject 40 Units into the skin 2 (two) times daily at 8 am and 10 pm.         . insulin regular (NOVOLIN R,HUMULIN R) 100 units/mL injection   Subcutaneous   Inject 5-30 Units into the skin 3 (three) times daily before meals. Sliding scale 81-120= 5 units, 121-140= 7 units, 141-180-=10 units, 181-240=12 units, 241-280=15 unites, 281-340=22 units, 341-400=25 units, >400=30 units         . lovastatin (MEVACOR) 20 MG tablet   Oral   Take 20 mg by mouth at bedtime.         . Multiple Vitamin (MULTIVITAMIN WITH MINERALS) TABS   Oral   Take 1 tablet by mouth daily.         . polyethylene glycol (MIRALAX / GLYCOLAX) packet   Oral   Take 17 g by mouth daily.         . promethazine (PHENERGAN) 25 MG tablet  Oral   Take 1 tablet (25 mg total) by mouth every 6 (six) hours as needed for nausea.   30 tablet   0   . sertraline (ZOLOFT) 50 MG tablet   Oral   Take 50 mg by mouth daily at 12 noon.          . traZODone (DESYREL) 100 MG tablet   Oral   Take 100 mg by mouth at bedtime.          There were no vitals taken for this visit. Physical Exam  Nursing note and vitals reviewed. Constitutional: He is oriented to person, place, and time. He appears well-developed and well-nourished.  HENT:  Head: Normocephalic and atraumatic.  Eyes: EOM are normal.  Neck: Normal range of motion.  Cardiovascular: Normal rate, regular rhythm, normal heart sounds and intact distal pulses.   Pulmonary/Chest: Effort normal and breath sounds normal. No respiratory distress.  Abdominal: Soft. He exhibits no distension.  Obese.  Tenderness in the right lower quadrant without peritoneal signs  Musculoskeletal: Normal range of motion.   Neurological: He is alert and oriented to person, place, and time.  Skin: Skin is warm and dry.  Psychiatric: He has a normal mood and affect. Judgment normal.    ED Course  Procedures (including critical care time) Labs Review Labs Reviewed  CBC WITH DIFFERENTIAL  BASIC METABOLIC PANEL   Imaging Review Ct Chest W Contrast  05/22/2013   CLINICAL DATA:  Non-Hodgkin's lymphoma.  EXAM: CT CHEST, ABDOMEN, AND PELVIS WITH CONTRAST  TECHNIQUE: Multidetector CT imaging of the chest, abdomen and pelvis was performed following the standard protocol during bolus administration of intravenous contrast.  CONTRAST:  OMNIPAQUE IOHEXOL 300 MG/ML  SOLN  COMPARISON:  None.  FINDINGS: CT CHEST FINDINGS  There is no axillary lymphadenopathy. 12 mm short axis right peritracheal lymph node has a fatty hilum. No other mediastinal lymphadenopathy. No hilar lymphadenopathy. Heart size is normal. Coronary artery calcification is noted. There is no pericardial or pleural effusion.  Lung windows shows some bronchial wall thickening. No focal airspace consolidation. No pulmonary parenchymal nodule or mass.  Bone windows reveal no worrisome lytic or sclerotic osseous lesions.  CT ABDOMEN AND PELVIS FINDINGS  The liver measures 23.5 cm in craniocaudal length, enlarged. The spleen is enlarged at 16.4 cm. No focal abnormality is seen within the liver or spleen on this study performed without intravenous contrast material. The stomach, duodenum, pancreas, the and adrenal glands are unremarkable. 1.9 cm exophytic water density lesion from the upper pole of the right kidney is compatible with a cyst. The left kidney is unremarkable.  Right paramidline fashion defect about 15 cm cranial to the umbilicus contains herniated omental fat.  No abdominal aortic aneurysm. There is no free fluid or lymphadenopathy in the abdomen.  Imaging through the pelvis shows no free intraperitoneal fluid. No pelvic sidewall lymphadenopathy. Bladder  is unremarkable. Scattered diverticular change is noted in the sigmoid colon without evidence for diverticulitis. Terminal ileum is unremarkable.  There is soft tissue stranding around the appendix with apparent associated edema within the adjacent mesenteric. Several gas loculation are visualized within the appendiceal lumen.  Bone windows reveal no worrisome lytic or sclerotic osseous lesions.  IMPRESSION: No acute findings in the chest.  Hepatosplenomegaly.  Appendix is technically dilated and has an ill-defined wall with possible periappendiceal and adjacent mesenteric edema. Acute appendicitis cannot be excluded by imaging.  Right paramidline ventral hernia.  Exophytic right renal cyst.  I called these  results to Dr. Alver Fisher nurse, Geri [place]in[place] (as he was out of the office) at 1300 hr on 05/22/2013.   Electronically Signed   By: Kennith Center M.D.   On: 05/22/2013 13:01   Ct Abdomen Pelvis W Contrast  05/22/2013   CLINICAL DATA:  Non-Hodgkin's lymphoma.  EXAM: CT CHEST, ABDOMEN, AND PELVIS WITH CONTRAST  TECHNIQUE: Multidetector CT imaging of the chest, abdomen and pelvis was performed following the standard protocol during bolus administration of intravenous contrast.  CONTRAST:  OMNIPAQUE IOHEXOL 300 MG/ML  SOLN  COMPARISON:  None.  FINDINGS: CT CHEST FINDINGS  There is no axillary lymphadenopathy. 12 mm short axis right peritracheal lymph node has a fatty hilum. No other mediastinal lymphadenopathy. No hilar lymphadenopathy. Heart size is normal. Coronary artery calcification is noted. There is no pericardial or pleural effusion.  Lung windows shows some bronchial wall thickening. No focal airspace consolidation. No pulmonary parenchymal nodule or mass.  Bone windows reveal no worrisome lytic or sclerotic osseous lesions.  CT ABDOMEN AND PELVIS FINDINGS  The liver measures 23.5 cm in craniocaudal length, enlarged. The spleen is enlarged at 16.4 cm. No focal abnormality is seen within the liver or  spleen on this study performed without intravenous contrast material. The stomach, duodenum, pancreas, the and adrenal glands are unremarkable. 1.9 cm exophytic water density lesion from the upper pole of the right kidney is compatible with a cyst. The left kidney is unremarkable.  Right paramidline fashion defect about 15 cm cranial to the umbilicus contains herniated omental fat.  No abdominal aortic aneurysm. There is no free fluid or lymphadenopathy in the abdomen.  Imaging through the pelvis shows no free intraperitoneal fluid. No pelvic sidewall lymphadenopathy. Bladder is unremarkable. Scattered diverticular change is noted in the sigmoid colon without evidence for diverticulitis. Terminal ileum is unremarkable.  There is soft tissue stranding around the appendix with apparent associated edema within the adjacent mesenteric. Several gas loculation are visualized within the appendiceal lumen.  Bone windows reveal no worrisome lytic or sclerotic osseous lesions.  IMPRESSION: No acute findings in the chest.  Hepatosplenomegaly.  Appendix is technically dilated and has an ill-defined wall with possible periappendiceal and adjacent mesenteric edema. Acute appendicitis cannot be excluded by imaging.  Right paramidline ventral hernia.  Exophytic right renal cyst.  I called these results to Dr. Alver Fisher nurse, Geri [place]in[place] (as he was out of the office) at 1300 hr on 05/22/2013.   Electronically Signed   By: Kennith Center M.D.   On: 05/22/2013 13:01  I personally reviewed the imaging tests through PACS system I reviewed available ER/hospitalization records through the EMR   ECG interpretation   Date: 05/23/2013  Rate: 75  Rhythm: normal sinus rhythm  QRS Axis: normal  Intervals: normal  ST/T Wave abnormalities: normal  Conduction Disutrbances: none  Narrative Interpretation:   Old EKG Reviewed: No significant changes noted     MDM   1. Appendicitis    Clinically the patient has acute appendicitis.   General surgery consultation.  N.p.o.  Patient will be started on antibiotics at this time.   11:18 AM i spoke with [facility] who will consult. They have asked that the patient be admitted to the medical service and they will consult.  [facility] medicine admission given his diabetes, COPD, non-Hodgkin's lymphoma getting chemotherapy.  Lyanne Co, MD 05/23/13 [phone]

## 2013-05-23 NOTE — Telephone Encounter (Signed)
gave pt apt for lab,md and injectiosn for October and November , emailed [place] regarding chemo,chemo on 10/22 ok per MD

## 2013-05-23 NOTE — Op Note (Addendum)
Re:   Micheal Thompson DOB:   12-Feb-1951 MRN:   [id number]                   FACILITY:  [facility]  DATE OF PROCEDURE: 05/23/2013                              OPERATIVE REPORT  PREOPERATIVE DIAGNOSIS:  Appendicitis  POSTOPERATIVE DIAGNOSIS:  Chronic appendicitis.  (photos at the end of the case).  Adhesions to RUQ with ventral hernia.  PROCEDURE:  Laparoscopic appendectomy.  SURGEON:  Sandria Bales. Ezzard Standing, MD  ASSISTANT:  No first assistant.  ANESTHESIA:  General endotracheal.  Anesthesiologist: Gaylan Gerold, MD CRNA: Enriqueta Shutter; Donn Pierini, CRNA  ASA:  3E  ESTIMATED BLOOD LOSS:  Minimal.  DRAINS: none   SPECIMEN:   Appendix  COUNTS CORRECT:  YES  INDICATIONS FOR PROCEDURE: Micheal Thompson is a 62 y.o. (DOB: October 03, 1950) white  male whose primary care doctor is PERINI,MARK A, MD and comes to the OR for an appendectomy.   I discussed with the patient, the indications and potential complications of appendiceal surgery.  The potential complications include, but are not limited to, bleeding, open surgery, bowel resection, and the possibility of another diagnosis.  OPERATIVE NOTE:  The patient underwent a general endotracheal anesthetic as supervised by Anesthesiologist: Gaylan Gerold, MD CRNA: Enriqueta Shutter; Donn Pierini, CRNA, General, in room #12.  The patient was given 2 g of cefoxitin at the beginning of the procedure and the abdomen was prepped with ChloraPrep.  The patient did not have a foley catheter placed at the beginning of the procedure.  A time-out was held and surgical checklist run.  An infraumbilical incision was made with sharp dissection carried down to the abdominal cavity.  An 12 mm Hasson trocar was inserted through the infraumbilical incision and into the peritoneal cavity.  A 0 degree 10 mm laparoscope was inserted through a 12 mm Hasson trocar and the Hasson trocar secured with a 0 Vicryl suture.  I placed a 5 mm trocar in the right upper  quadrant and 11 mm torcar in left lower quadrant and did abdominal exploration.    The right and left lobes of liver unremarkable.  The patient had adhesions in the RUQ which obscured part of the right lobe of the liver.  Stomach was unremarkable.  The pelvic organs were unremarkable.  I saw no other intra-abdominal abnormality.  The patient had appendicitis with the appendix located immediately below the right lobe of the liver.  The appendix had evidence of chronic inflammation, which correlates better with the three week history he gave.  The mesentery of the appendix was divided with a Harmonic scalpel.  I got to the base of the appendix.  I then used a blue load 45 mm Ethicon Endo-GIA stapler and fired this across the base of the appendix.  I placed the appendix in EndoCatch bag and delivered the bag through the umbilical incision.  I irrigated the abdomen with 800 cc of saline.  After irrigating the abdomen, I then removed the trocars, in turn.  The umbilical port fascia was closed with 0 Vicryl suture.   I closed the skin each site with a 5-0 Monocryl suture and painted the wounds with Dermabond.  I then injected a total of 30 mL of 0.25% Marcaine at the incisions.  Sponge and needle count were correct at the end  of the case.  The patient was transferred to the recovery room in good condition.  The patient tolerated the procedure well and it depends on the patient's post op clinical course as to when he could be discharged.    Above:  Appendix mobilized.  Note right lobe of liver.   [name], MD, [facility] Surgery Pager: [phone] Office phone:  [phone]

## 2013-05-23 NOTE — Telephone Encounter (Signed)
Per staff message and POF I have scheduled appts. I have moved treatment from 10/21 to 10/22, no openings on 10/21. Scheduler notified

## 2013-05-23 NOTE — Consult Note (Addendum)
[facility] Medical Consultation  Micheal Thompson:[id number] DOB: 11-15-1950 DOA: 05/23/2013 PCP: Ezequiel Kayser, MD   Requesting physician: Dr. Ezzard Standing Date of consultation: 05/23/2013 Reason for consultation: management of medical problems pre- and post-op  Impression/Recommendations Principal Problem:   Appendicitis Active Problems:   Splenomegaly   Non Hodgkin's lymphoma   Diabetes mellitus   Mantle cell lymphoma   Hyperlipidemia   Leukocytosis   Hyperglycemia  Acute appendicitis  - per surgery, plan for OR today Pseudohyponatremia - noted, due to elevated blood sugars Leukocytosis - due to #1  Mild anemia - likely due to chemo, monitor  IDDM - NPO now, start low dose NPH tonight, sliding scale meanwhile. Increase regimen as indicated once he starts eating.  Mantle lymphoma - currently undergoing chemotherapy per Dr. Clelia Croft. Cycle 4 on hold now due to #1. COPD - start Albuterol prn  HLD - restart statin once not NPO.   I will followup again tomorrow. Please contact me if I can be of assistance in the meanwhile. Thank you for this consultation.  Chief Complaint: right lower quadrant abdominal pain  HPI:  62 yo M with history of insulin dependent DM on NPH 40U BID and SSI, COPD, previous HTN now controlled with low salt diet, HLD, recently diagnosed mantle cell lymphoma s/p chemotherapy with bendamustine and rituximab started on 02/28/2013, completed 3 cycles. He went to see his primary oncologist Dr. Clelia Croft today to start his 4th cycle. He complained in the office today of right lower quadrant pain for the past few days, low grade fiver and mild nausea. Denies vomiting. He denies any chest pain, breathing difficulties. Denies lightheadedness or dizziness. He denies any chest discomfort with exertion or dyspnea. He denies cardiac history. He underwent a CT abdomen yesterday which showed findings c/w appendicitis and was sent to the ED from his oncologist office for  surgical evaluation.   As far as his non-Hodgkin's lymphoma goes, he appears to have a good response to chemotherapy.   Review of Systems:  As per HPI otherwise negative.   Past Medical History  Diagnosis Date  . H/O colonoscopy 01/13/2013  . Hyperlipidemia   . Diabetes mellitus without complication   . Hypertension   . Depression   . H/O Bell's palsy   . Pancarditis   . ED (erectile dysfunction)   . Colon polyps   . COPD (chronic obstructive pulmonary disease)   . GERD (gastroesophageal reflux disease)   . Non Hodgkin's lymphoma    Past Surgical History  Procedure Laterality Date  . Hernia repair    . Shoulder arthroscopy    . Cholecystectomy     Social History:  reports that he quit smoking about 29 years ago. He has never used smokeless tobacco. He reports that he does not drink alcohol or use illicit drugs.  Allergies  Allergen Reactions  . Penicillins Other (See Comments)    unknown   Family History  Problem Relation Age of Onset  . Cancer Maternal Grandmother     unknown  . Heart attack Brother     Prior to Admission medications   Medication Sig Start Date End Date Taking? Authorizing Provider  docusate sodium (COLACE) 100 MG capsule Take 100 mg by mouth 2 (two) times daily as needed for constipation.    Yes Historical Provider, MD  HYDROcodone-acetaminophen (NORCO/VICODIN) 5-325 MG per tablet Take 1 tablet by mouth every 6 (six) hours as needed for pain.   Yes Historical Provider, MD  insulin NPH (HUMULIN N,NOVOLIN  N) 100 UNIT/ML injection Inject 40 Units into the skin 2 (two) times daily at 8 am and 10 pm.   Yes Historical Provider, MD  insulin regular (NOVOLIN R,HUMULIN R) 100 units/mL injection Inject 5-30 Units into the skin 3 (three) times daily before meals. Sliding scale 81-120= 5 units, 121-140= 7 units, 141-180-=10 units, 181-240=12 units, 241-280=15 unites, 281-340=22 units, 341-400=25 units, >400=30 units   Yes Historical Provider, MD  lovastatin  (MEVACOR) 20 MG tablet Take 20 mg by mouth at bedtime.   Yes Historical Provider, MD  Multiple Vitamin (MULTIVITAMIN WITH MINERALS) TABS Take 1 tablet by mouth daily.   Yes Historical Provider, MD  promethazine (PHENERGAN) 25 MG tablet Take 1 tablet (25 mg total) by mouth every 6 (six) hours as needed for nausea. 02/22/13  Yes Benjiman Core, MD  sertraline (ZOLOFT) 50 MG tablet Take 50 mg by mouth daily at 12 noon.    Yes Historical Provider, MD  traZODone (DESYREL) 100 MG tablet Take 100 mg by mouth at bedtime.   Yes Historical Provider, MD  polyethylene glycol (MIRALAX / GLYCOLAX) packet Take 17 g by mouth daily.    Historical Provider, MD   Physical Exam: Blood pressure 136/68, pulse 76, temperature 98.6 F (37 C), temperature source Oral, resp. rate 18, height 6\' 1"  (1.854 m), weight 116.121 kg (256 lb), SpO2 96.00%. Filed Vitals:   05/23/13 1128  BP: 136/68  Pulse: 76  Temp:   Resp: 18     General:  NAD, pleasant caucadian male  Eyes: no scleral icterus  ENT: moist oropharynx  Neck: no JVD  Cardiovascular: RRR without MRG  Respiratory: CTA biL, no wheezing or crackles  Abdomen: soft, mild tenderness to palpation RLQ, no rebound  Skin: no rashes  Musculoskeletal: no edema  Psychiatric: normal mood and affect  Neurologic: non focal.   Labs on Admission:  Basic Metabolic Panel:  Recent Labs Lab 05/22/13 0942 05/23/13 1050  NA 137 134*  K 4.3 4.2  CL  --  98  CO2 23 24  GLUCOSE 249* 379*  BUN 20.6 27*  CREATININE 0.9 0.91  CALCIUM 9.0 9.2   Liver Function Tests:  Recent Labs Lab 05/22/13 0942  AST 12  ALT 13  ALKPHOS 156*  BILITOT 0.38  PROT 6.8  ALBUMIN 3.6   CBC:  Recent Labs Lab 05/22/13 0941 05/23/13 1050  WBC 7.6 11.5*  NEUTROABS 6.4 10.4*  HGB 12.6* 12.5*  HCT 35.9* 35.6*  MCV 87.1 87.7  PLT 188 216   Radiological Exams on Admission: Ct Chest W Contrast  05/22/2013   CLINICAL DATA:  Non-Hodgkin's lymphoma.  EXAM: CT CHEST,  ABDOMEN, AND PELVIS WITH CONTRAST  TECHNIQUE: Multidetector CT imaging of the chest, abdomen and pelvis was performed following the standard protocol during bolus administration of intravenous contrast.  CONTRAST:  OMNIPAQUE IOHEXOL 300 MG/ML  SOLN  COMPARISON:  None.  FINDINGS: CT CHEST FINDINGS  There is no axillary lymphadenopathy. 12 mm short axis right peritracheal lymph node has a fatty hilum. No other mediastinal lymphadenopathy. No hilar lymphadenopathy. Heart size is normal. Coronary artery calcification is noted. There is no pericardial or pleural effusion.  Lung windows shows some bronchial wall thickening. No focal airspace consolidation. No pulmonary parenchymal nodule or mass.  Bone windows reveal no worrisome lytic or sclerotic osseous lesions.  CT ABDOMEN AND PELVIS FINDINGS  The liver measures 23.5 cm in craniocaudal length, enlarged. The spleen is enlarged at 16.4 cm. No focal abnormality is seen within  the liver or spleen on this study performed without intravenous contrast material. The stomach, duodenum, pancreas, the and adrenal glands are unremarkable. 1.9 cm exophytic water density lesion from the upper pole of the right kidney is compatible with a cyst. The left kidney is unremarkable.  Right paramidline fashion defect about 15 cm cranial to the umbilicus contains herniated omental fat.  No abdominal aortic aneurysm. There is no free fluid or lymphadenopathy in the abdomen.  Imaging through the pelvis shows no free intraperitoneal fluid. No pelvic sidewall lymphadenopathy. Bladder is unremarkable. Scattered diverticular change is noted in the sigmoid colon without evidence for diverticulitis. Terminal ileum is unremarkable.  There is soft tissue stranding around the appendix with apparent associated edema within the adjacent mesenteric. Several gas loculation are visualized within the appendiceal lumen.  Bone windows reveal no worrisome lytic or sclerotic osseous lesions.  IMPRESSION:  No acute findings in the chest.  Hepatosplenomegaly.  Appendix is technically dilated and has an ill-defined wall with possible periappendiceal and adjacent mesenteric edema. Acute appendicitis cannot be excluded by imaging.  Right paramidline ventral hernia.  Exophytic right renal cyst.  I called these results to Dr. Alver Fisher nurse, Geri [place] (as he was out of the office) at 1300 hr on 05/22/2013.   Electronically Signed   By: Kennith Center M.D.   On: 05/22/2013 13:01   Ct Abdomen Pelvis W Contrast  05/22/2013   CLINICAL DATA:  Non-Hodgkin's lymphoma.  EXAM: CT CHEST, ABDOMEN, AND PELVIS WITH CONTRAST  TECHNIQUE: Multidetector CT imaging of the chest, abdomen and pelvis was performed following the standard protocol during bolus administration of intravenous contrast.  CONTRAST:  OMNIPAQUE IOHEXOL 300 MG/ML  SOLN  COMPARISON:  None.  FINDINGS: CT CHEST FINDINGS  There is no axillary lymphadenopathy. 12 mm short axis right peritracheal lymph node has a fatty hilum. No other mediastinal lymphadenopathy. No hilar lymphadenopathy. Heart size is normal. Coronary artery calcification is noted. There is no pericardial or pleural effusion.  Lung windows shows some bronchial wall thickening. No focal airspace consolidation. No pulmonary parenchymal nodule or mass.  Bone windows reveal no worrisome lytic or sclerotic osseous lesions.  CT ABDOMEN AND PELVIS FINDINGS  The liver measures 23.5 cm in craniocaudal length, enlarged. The spleen is enlarged at 16.4 cm. No focal abnormality is seen within the liver or spleen on this study performed without intravenous contrast material. The stomach, duodenum, pancreas, the and adrenal glands are unremarkable. 1.9 cm exophytic water density lesion from the upper pole of the right kidney is compatible with a cyst. The left kidney is unremarkable.  Right paramidline fashion defect about 15 cm cranial to the umbilicus contains herniated omental fat.  No abdominal aortic aneurysm.  There is no free fluid or lymphadenopathy in the abdomen.  Imaging through the pelvis shows no free intraperitoneal fluid. No pelvic sidewall lymphadenopathy. Bladder is unremarkable. Scattered diverticular change is noted in the sigmoid colon without evidence for diverticulitis. Terminal ileum is unremarkable.  There is soft tissue stranding around the appendix with apparent associated edema within the adjacent mesenteric. Several gas loculation are visualized within the appendiceal lumen.  Bone windows reveal no worrisome lytic or sclerotic osseous lesions.  IMPRESSION: No acute findings in the chest.  Hepatosplenomegaly.  Appendix is technically dilated and has an ill-defined wall with possible periappendiceal and adjacent mesenteric edema. Acute appendicitis cannot be excluded by imaging.  Right paramidline ventral hernia.  Exophytic right renal cyst.  I called these results to Dr. Alver Fisher nurse, Geri [place]in[place] (  as he was out of the office) at 1300 hr on 05/22/2013.   Electronically Signed   By: Kennith Center M.D.   On: 05/22/2013 13:01    EKG: Independently reviewed. Sinus rhythm  Time spent: 55  Pamella Pert [facility] Pager [phone]  If 7PM-7AM, please contact night-coverage [email or web address] Password [email or web address] 05/23/2013, 12:18 PM

## 2013-05-24 ENCOUNTER — Ambulatory Visit: Payer: BC Managed Care – PPO

## 2013-05-24 ENCOUNTER — Encounter (HOSPITAL_COMMUNITY): Payer: Self-pay | Admitting: Surgery

## 2013-05-24 DIAGNOSIS — R7309 Other abnormal glucose: Secondary | ICD-10-CM

## 2013-05-24 DIAGNOSIS — E119 Type 2 diabetes mellitus without complications: Secondary | ICD-10-CM

## 2013-05-24 LAB — GLUCOSE, CAPILLARY: Glucose-Capillary: 282 mg/dL — ABNORMAL HIGH (ref 70–99)

## 2013-05-24 MED ORDER — IBUPROFEN 200 MG PO TABS: ORAL_TABLET | ORAL | Status: DC

## 2013-05-24 MED ORDER — HYDROCODONE-ACETAMINOPHEN 5-325 MG PO TABS: ORAL_TABLET | ORAL | Status: DC

## 2013-05-24 MED ORDER — ACETAMINOPHEN 325 MG PO TABS: 650.0000 mg | ORAL_TABLET | Freq: Four times a day (QID) | ORAL | Status: DC | PRN

## 2013-05-24 MED ORDER — ACETAMINOPHEN 325 MG PO TABS: ORAL_TABLET | ORAL | Status: DC

## 2013-05-24 NOTE — Progress Notes (Signed)
1 Day Post-Op  Subjective: He feels better, sore, but no major complaints.  Not really hungry, but I will advance his diet and see how he does.    Objective: Vital signs in last 24 hours: Temp:  [97.5 F (36.4 C)-98.6 F (37 C)] 98.1 F (36.7 C) (10/15 0513) Pulse Rate:  [71-85] 75 (10/15 0513) Resp:  [16-20] 18 (10/15 0513) BP: (112-166)/(49-89) 125/72 mmHg (10/15 0513) SpO2:  [91 %-100 %] 96 % (10/15 0513) FiO2 (%):  [2 %] 2 % (10/14 1830) Weight:  [116.121 kg (256 lb)-122.789 kg (270 lb 11.2 oz)] 122.108 kg (269 lb 3.2 oz) (10/15 0513) Last BM Date:  (PTA) Diet: clear liquids Afebrile, VSS No labs Intake/Output from previous day: 10/14 0701 - 10/15 0700 In: 1495 [I.V.:1495] Out: 2050 [Urine:2050] Intake/Output this shift:    General appearance: alert, cooperative, no distress and pretty sore GI: soft, abdomen is still sore from surgery.  incisions all look good.  Lab Results:   Recent Labs  05/22/13 0941 05/23/13 1050  WBC 7.6 11.5*  HGB 12.6* 12.5*  HCT 35.9* 35.6*  PLT 188 216    BMET  Recent Labs  05/22/13 0942 05/23/13 1050  NA 137 134*  K 4.3 4.2  CL  --  98  CO2 23 24  GLUCOSE 249* 379*  BUN 20.6 27*  CREATININE 0.9 0.91  CALCIUM 9.0 9.2   PT/INR  Recent Labs  05/23/13 1050  LABPROT 12.2  INR 0.92     Recent Labs Lab 05/22/13 0942  AST 12  ALT 13  ALKPHOS 156*  BILITOT 0.38  PROT 6.8  ALBUMIN 3.6     Lipase  No results found for this basename: lipase     Studies/Results: Ct Chest W Contrast  05/22/2013   CLINICAL DATA:  Non-Hodgkin's lymphoma.  EXAM: CT CHEST, ABDOMEN, AND PELVIS WITH CONTRAST  TECHNIQUE: Multidetector CT imaging of the chest, abdomen and pelvis was performed following the standard protocol during bolus administration of intravenous contrast.  CONTRAST:  OMNIPAQUE IOHEXOL 300 MG/ML  SOLN  COMPARISON:  None.  FINDINGS: CT CHEST FINDINGS  There is no axillary lymphadenopathy. 12 mm short axis right  peritracheal lymph node has a fatty hilum. No other mediastinal lymphadenopathy. No hilar lymphadenopathy. Heart size is normal. Coronary artery calcification is noted. There is no pericardial or pleural effusion.  Lung windows shows some bronchial wall thickening. No focal airspace consolidation. No pulmonary parenchymal nodule or mass.  Bone windows reveal no worrisome lytic or sclerotic osseous lesions.  CT ABDOMEN AND PELVIS FINDINGS  The liver measures 23.5 cm in craniocaudal length, enlarged. The spleen is enlarged at 16.4 cm. No focal abnormality is seen within the liver or spleen on this study performed without intravenous contrast material. The stomach, duodenum, pancreas, the and adrenal glands are unremarkable. 1.9 cm exophytic water density lesion from the upper pole of the right kidney is compatible with a cyst. The left kidney is unremarkable.  Right paramidline fashion defect about 15 cm cranial to the umbilicus contains herniated omental fat.  No abdominal aortic aneurysm. There is no free fluid or lymphadenopathy in the abdomen.  Imaging through the pelvis shows no free intraperitoneal fluid. No pelvic sidewall lymphadenopathy. Bladder is unremarkable. Scattered diverticular change is noted in the sigmoid colon without evidence for diverticulitis. Terminal ileum is unremarkable.  There is soft tissue stranding around the appendix with apparent associated edema within the adjacent mesenteric. Several gas loculation are visualized within the appendiceal lumen.  Bone windows reveal no worrisome lytic or sclerotic osseous lesions.  IMPRESSION: No acute findings in the chest.  Hepatosplenomegaly.  Appendix is technically dilated and has an ill-defined wall with possible periappendiceal and adjacent mesenteric edema. Acute appendicitis cannot be excluded by imaging.  Right paramidline ventral hernia.  Exophytic right renal cyst.  I called these results to Dr. Alver Fisher nurse, Geri [place]in[place] (as he was out of the  office) at 1300 hr on 05/22/2013.   Electronically Signed   By: Kennith Center M.D.   On: 05/22/2013 13:01   Ct Abdomen Pelvis W Contrast  05/22/2013   CLINICAL DATA:  Non-Hodgkin's lymphoma.  EXAM: CT CHEST, ABDOMEN, AND PELVIS WITH CONTRAST  TECHNIQUE: Multidetector CT imaging of the chest, abdomen and pelvis was performed following the standard protocol during bolus administration of intravenous contrast.  CONTRAST:  OMNIPAQUE IOHEXOL 300 MG/ML  SOLN  COMPARISON:  None.  FINDINGS: CT CHEST FINDINGS  There is no axillary lymphadenopathy. 12 mm short axis right peritracheal lymph node has a fatty hilum. No other mediastinal lymphadenopathy. No hilar lymphadenopathy. Heart size is normal. Coronary artery calcification is noted. There is no pericardial or pleural effusion.  Lung windows shows some bronchial wall thickening. No focal airspace consolidation. No pulmonary parenchymal nodule or mass.  Bone windows reveal no worrisome lytic or sclerotic osseous lesions.  CT ABDOMEN AND PELVIS FINDINGS  The liver measures 23.5 cm in craniocaudal length, enlarged. The spleen is enlarged at 16.4 cm. No focal abnormality is seen within the liver or spleen on this study performed without intravenous contrast material. The stomach, duodenum, pancreas, the and adrenal glands are unremarkable. 1.9 cm exophytic water density lesion from the upper pole of the right kidney is compatible with a cyst. The left kidney is unremarkable.  Right paramidline fashion defect about 15 cm cranial to the umbilicus contains herniated omental fat.  No abdominal aortic aneurysm. There is no free fluid or lymphadenopathy in the abdomen.  Imaging through the pelvis shows no free intraperitoneal fluid. No pelvic sidewall lymphadenopathy. Bladder is unremarkable. Scattered diverticular change is noted in the sigmoid colon without evidence for diverticulitis. Terminal ileum is unremarkable.  There is soft tissue stranding around the appendix  with apparent associated edema within the adjacent mesenteric. Several gas loculation are visualized within the appendiceal lumen.  Bone windows reveal no worrisome lytic or sclerotic osseous lesions.  IMPRESSION: No acute findings in the chest.  Hepatosplenomegaly.  Appendix is technically dilated and has an ill-defined wall with possible periappendiceal and adjacent mesenteric edema. Acute appendicitis cannot be excluded by imaging.  Right paramidline ventral hernia.  Exophytic right renal cyst.  I called these results to Dr. Alver Fisher nurse, Geri [place]in[place] (as he was out of the office) at 1300 hr on 05/22/2013.   Electronically Signed   By: Kennith Center M.D.   On: 05/22/2013 13:01    Medications: . sodium chloride   Intravenous STAT  . cefOXitin  1 g Intravenous Q6H  . heparin subcutaneous  5,000 Units Subcutaneous Q8H  . insulin aspart  0-15 Units Subcutaneous TID WC  . insulin NPH  10 Units Subcutaneous BID AC & HS  . simvastatin  10 mg Oral q1800    Assessment/Plan Chronic appendicitis  S/p Laparoscopic appendectomy. Kandis Cocking, MD, 05/23/2013  Non Hodgkin's Mantle cell lymphoma/on chemotherapy Splenomegaly Diabetes mellitus COPD Dyslipidemia GERD Prior cholecystectomy   Plan:  Advance his diet, mobilize, and if doing well home after lunch.    LOS: 1 day  JENNINGS,WILLARD 05/24/2013  Doing well. Expects to get discharge today.  [name], MD, [facility] Surgery Pager: [phone] Office phone:  [phone]

## 2013-05-24 NOTE — Anesthesia Postprocedure Evaluation (Signed)
Anesthesia Post Note  Patient: Micheal Thompson  Procedure(s) Performed: Procedure(s) (LRB): APPENDECTOMY LAPAROSCOPIC (N/A)  Anesthesia type: General  Patient location: PACU  Post pain: Pain level controlled  Post assessment: Post-op Vital signs reviewed  Last Vitals: BP 125/72  Pulse 75  Temp(Src) 36.7 C (Oral)  Resp 18  Ht 6\' 1"  (1.854 m)  Wt 269 lb 3.2 oz (122.108 kg)  BMI 35.52 kg/m2  SpO2 96%  Post vital signs: Reviewed  Level of consciousness: sedated  Complications: No apparent anesthesia complications

## 2013-05-24 NOTE — Progress Notes (Signed)
Patient ID: Micheal Thompson, male   DOB: Feb 19, 1951, 62 y.o.   MRN: [id number] [facility] PROGRESS NOTE  Micheal Thompson:[id number] DOB: 08/08/1951 DOA: 05/23/2013 PCP: Ezequiel Kayser, MD  Brief narrative: 62 yo M with history of insulin dependent DM on NPH 40U BID and SSI, COPD, previous HTN now controlled with low salt diet, HLD, recently diagnosed mantle cell lymphoma s/p chemotherapy with bendamustine and rituximab started on 02/28/2013, completed 3 cycles. He went to see his primary oncologist Dr. Clelia Croft today to start his 4th cycle. He complained in the office today of right lower quadrant pain for the past few days, low grade fiver and mild nausea. Denies vomiting. He denies any chest pain, breathing difficulties. Denies lightheadedness or dizziness. He denies any chest discomfort with exertion or dyspnea. He denies cardiac history. He underwent a CT abdomen yesterday which showed findings c/w appendicitis and was sent to the ED from his oncologist office for surgical evaluation.  As far as his non-Hodgkin's lymphoma goes, he appears to have a good response to chemotherapy.   Impression/Recommendations: Acute appendicitis  - per surgery, status post laparoscopic appendectomy 05/23/2013  - clinically stable  - plan to ambulate today and advance diet and if pt doing well, home after lunch  Pseudohyponatremia  - due to elevated blood sugars but stable, encouraged PO intake - will need to have BMP checked in PCP office upon follow up  Leukocytosis - secondary to principal problem, no clear infectious etiology noted - will need CBC check upon follow up with PCP Mild anemia  - likely due to chemo, no signs of active bleen IDDM - continue insulin per home medical regimen along with sliding scale Mantle lymphoma  - currently undergoing chemotherapy per Dr. Clelia Croft. Cycle 4 on hold now due to #1.  COPD  - albuterol as needed while inpatient  - he is not on nebulizer or inhaler at home  but we can continue inhaler albuterol Q4 hours PRN shortness of breath  HLD  - resume statin upon discharge, pt on Lovastatin at home   Procedures/Studies: Ct Chest W Contrast  05/22/2013   There is no axillary lymphadenopathy. 12 mm short axis right peritracheal lymph node has a fatty hilum. No other mediastinal lymphadenopathy. No hilar lymphadenopathy. Heart size is normal. Coronary artery calcification is noted. There is no pericardial or pleural effusion.  Lung windows shows some bronchial wall thickening. No focal airspace consolidation. No pulmonary parenchymal nodule or mass.  Bone windows reveal no worrisome lytic or sclerotic osseous lesions.   CT ABDOMEN AND PELVIS 05/22/2013   No acute findings in the chest.  Hepatosplenomegaly.  Appendix is technically dilated and has an ill-defined wall with possible periappendiceal and adjacent mesenteric edema. Acute appendicitis cannot be excluded by imaging.  Right paramidline ventral hernia.  Exophytic right renal cyst.  Antibiotics:  None  Code Status: Full Family Communication: Pt at bedside Disposition Plan: Home today per primary team   HPI/Subjective: No events overnight.   Objective: Filed Vitals:   05/23/13 1730 05/23/13 1830 05/23/13 2200 05/24/13 0513  BP: 135/76 130/61 112/49 125/72  Pulse: 73 81 76 75  Temp: 97.7 F (36.5 C) 97.5 F (36.4 C) 97.9 F (36.6 C) 98.1 F (36.7 C)  TempSrc: Oral Oral Oral Oral  Resp: 20 18 18 18   Height:      Weight:    122.108 kg (269 lb 3.2 oz)  SpO2: 95% 96% 94% 96%    Intake/Output Summary (Last 24 hours)  at 05/24/13 1051 Last data filed at 05/24/13 0909  Gross per 24 hour  Intake   1495 ml  Output   2450 ml  Net   -955 ml    Exam:   General:  Pt is alert, follows commands appropriately, not in acute distress  Cardiovascular: Regular rate and rhythm, S1/S2, no murmurs, no rubs, no gallops  Respiratory: Clear to auscultation bilaterally, no wheezing, no crackles, no  rhonchi  Abdomen: Soft, non tender, bowel sounds present, no guarding  Extremities: No edema, pulses DP and PT palpable bilaterally  Neuro: Grossly nonfocal  Data Reviewed: Basic Metabolic Panel:  Recent Labs Lab 05/22/13 0942 05/23/13 1050  NA 137 134*  K 4.3 4.2  CL  --  98  CO2 23 24  GLUCOSE 249* 379*  BUN 20.6 27*  CREATININE 0.9 0.91  CALCIUM 9.0 9.2   Liver Function Tests:  Recent Labs Lab 05/22/13 0942  AST 12  ALT 13  ALKPHOS 156*  BILITOT 0.38  PROT 6.8  ALBUMIN 3.6   CBC:  Recent Labs Lab 05/22/13 0941 05/23/13 1050  WBC 7.6 11.5*  NEUTROABS 6.4 10.4*  HGB 12.6* 12.5*  HCT 35.9* 35.6*  MCV 87.1 87.7  PLT 188 216   CBG:  Recent Labs Lab 05/23/13 1218 05/23/13 1518 05/23/13 1743 05/23/13 2117 05/24/13 0747  GLUCAP 290* 307* 223* 154* 168*    No results found for this or any previous visit (from the past 240 hour(s)).   Scheduled Meds: . sodium chloride   Intravenous STAT  . heparin subcutaneous  5,000 Units Subcutaneous Q8H  . insulin aspart  0-15 Units Subcutaneous TID WC  . insulin NPH  10 Units Subcutaneous BID AC & HS  . simvastatin  10 mg Oral q1800   Continuous Infusions: . sodium chloride     Debbora Presto, MD  [facility] Pager [phone]  If 7PM-7AM, please contact night-coverage [email or web address] Password [email or web address] 05/24/2013, 10:51 AM   LOS: 1 day

## 2013-05-24 NOTE — Discharge Instructions (Signed)
Laparoscopic Appendectomy °Appendectomy is surgery to remove the appendix. Laparoscopic surgery uses several small cuts (incisions) instead of one large incision. Laparoscopic surgery offers a shorter recovery time and less discomfort. °LET YOUR CAREGIVER KNOW ABOUT: °· Allergies to food or medicine. °· Medicines taken, including vitamins, dietary supplements, herbs, eyedrops, over-the-counter medicines, and creams. °· Use of steroids (by mouth or creams). °· Previous problems with anesthetics or numbing medicines. °· History of bleeding problems or blood clots. °· Previous surgery. °· Other health problems, including diabetes, heart problems, lung problems, and kidney problems. °· Possibility of pregnancy, if this applies. °RISKS AND COMPLICATIONS °· Infection. A germ starts growing in the wound. This can usually be treated with antibiotics. In some cases, the wound will need to be opened and cleaned. °· Bleeding. °· Damage to other organs. °· Sores (abscesses). °· Chronic pain at the incision sites. This is defined as pain that lasts for more than 3 months. °· Blood clots in the legs that may rarely travel to the lungs. °· Infection in the lungs (pneumonia). °BEFORE THE PROCEDURE °Appendectomy is usually performed immediately after an inflamed appendix (appendicitis) is diagnosed. No preparation is necessary ahead of this procedure. °PROCEDURE  °You will be given medicine that makes you sleep (general anesthetic). After you are asleep, a flexible tube (catheter) may be inserted into your bladder to drain your urine during surgery. The tube is removed before you wake up after surgery. When you are asleep, carbon dioxide gas will be used to inflate your abdomen. This will allow your surgeon to see inside your abdomen and perform your surgery. Three small incisions will be made in your abdomen. Your surgeon will insert a thin, lighted tube (laparoscope) through one of the incisions. Your surgeon will look through the  laparoscope while performing the surgery. Other tools will be inserted through the other incisions. Laparoscopic procedures may not be appropriate when: °· There is major scarring from a previous surgery. °· The patient has bleeding disorders. °· A pregnancy is near term. °· There are other conditions which make the laparoscopic procedure impossible, such as an advanced infection or a ruptured appendix. °If your surgeon feels it is not safe to continue with the laparoscopic procedure, he or she will perform an open surgery instead. This gives the surgeon a larger view and more space to work. Open surgery requires a longer recovery time. After your appendix is removed, your incisions will be closed with stitches (sutures) or skin adhesive. °AFTER THE PROCEDURE °You will be taken to a recovery room. When the anesthesia has worn off, you will be returned to your hospital room. You will be given pain medicines to keep you comfortable. Ask your caregiver how long your hospital stay will be. °Document Released: 03/10/2004 Document Revised: 10/19/2011 Document Reviewed: 02/03/2011 °ExitCare® Patient Information ©2014 [employer]. °.CCS ______[facility] °LAPAROSCOPIC SURGERY: POST OP INSTRUCTIONS °Always review your discharge instruction sheet given to you by the facility where your surgery was performed. °IF YOU HAVE DISABILITY OR FAMILY LEAVE FORMS, YOU MUST BRING THEM TO THE OFFICE FOR PROCESSING.   °DO NOT GIVE THEM TO YOUR DOCTOR. ° °1. A prescription for pain medication may be given to you upon discharge.  Take your pain medication as prescribed, if needed.  If narcotic pain medicine is not needed, then you may take acetaminophen (Tylenol) or ibuprofen (Advil) as needed. °2. Take your usually prescribed medications unless otherwise directed. °3. If you need a refill on your pain medication, please contact   your pharmacy.  They will contact our office to request authorization. Prescriptions will  not be filled after 5pm or on week-ends. °4. You should follow a light diet the first few days after arrival home, such as soup and crackers, etc.  Be sure to include lots of fluids daily. °5. Most patients will experience some swelling and bruising in the area of the incisions.  Ice packs will help.  Swelling and bruising can take several days to resolve.  °6. It is common to experience some constipation if taking pain medication after surgery.  Increasing fluid intake and taking a stool softener (such as Colace) will usually help or prevent this problem from occurring.  A mild laxative (Milk of Magnesia or Miralax) should be taken according to package instructions if there are no bowel movements after 48 hours. °7. Unless discharge instructions indicate otherwise, you may remove your bandages 24-48 hours after surgery, and you may shower at that time.  You may have steri-strips (small skin tapes) in place directly over the incision.  These strips should be left on the skin for 7-10 days.  If your surgeon used skin glue on the incision, you may shower in 24 hours.  The glue will flake off over the next 2-3 weeks.  Any sutures or staples will be removed at the office during your follow-up visit. °8. ACTIVITIES:  You may resume regular (light) daily activities beginning the next day--such as daily self-care, walking, climbing stairs--gradually increasing activities as tolerated.  You may have sexual intercourse when it is comfortable.  Refrain from any heavy lifting or straining until approved by your doctor. °a. You may drive when you are no longer taking prescription pain medication, you can comfortably wear a seatbelt, and you can safely maneuver your car and apply brakes. °b. RETURN TO WORK:  __________________________________________________________ °9. You should see your doctor in the office for a follow-up appointment approximately 2-3 weeks after your surgery.  Make sure that you call for this appointment  within a day or two after you arrive home to insure a convenient appointment time. °10. OTHER INSTRUCTIONS: __________________________________________________________________________________________________________________________ __________________________________________________________________________________________________________________________ °WHEN TO CALL YOUR DOCTOR: °1. Fever over 101.0 °2. Inability to urinate °3. Continued bleeding from incision. °4. Increased pain, redness, or drainage from the incision. °5. Increasing abdominal pain ° °The clinic staff is available to answer your questions during regular business hours.  Please don’t hesitate to call and ask to speak to one of the nurses for clinical concerns.  If you have a medical emergency, go to the nearest emergency room or call 911.  A surgeon from [facility] is always on call at the hospital. °[address], [address], [place], [place]  [address] ? [address] [address], [place], [place]   [address] °[phone] ? [phone] ? FAX [phone] °Web site: [email or web address] ° °

## 2013-05-24 NOTE — Progress Notes (Signed)
Discussed with patient the need to begin ambulating and sitting in chair.  Discontinued the continuous O2 monitor.  Patient states he will be at home alone since his wife is out of town traveling with her job.

## 2013-05-24 NOTE — Progress Notes (Signed)
Physician Discharge Summary  Patient ID: Micheal Thompson MRN: [id number] DOB/AGE: Jun 16, 1951 62 y.o.  Admit date: 05/23/2013 Discharge date: 05/24/2013  Admission Diagnoses:  Chronic appendicitis Non Hodgkin's Mantle cell lymphoma/on chemotherapy  Splenomegaly  Diabetes mellitus  COPD  Dyslipidemia  GERD  Prior cholecystectomy  Discharge Diagnoses:  Principal Problem:   Appendicitis Active Problems:   Splenomegaly   Non Hodgkin's lymphoma   Diabetes mellitus   Mantle cell lymphoma   Hyperlipidemia   Leukocytosis   Hyperglycemia   PROCEDURES: S/p Laparoscopic appendectomy. Kandis Cocking, MD, 05/23/2013    Hospital Course: 62 y/o male with PMH non-Hodgkin's lymphoma on chemotherapy, IDDM, COPD presents to [facility] complaining of 3 days of worsening RLQ abdominal pain and nausea. He notes he had some discomfort 2-3 weeks ago which resolved after a few days and then returned in the same location over the last few days. He was seen by his oncologist yesterday for his 4th dose of chemo. He was sent for an OP CT scan which was consistent with acute appendicitis. He was instructed to go to the ED yesterday, but waiting until today to be seen at Dr. Alver Fisher office visit. He denies vomiting, fever/chills, CP/SOB.  Of note his is currently receiving chemotherapy and yesterday would have been his 4th round. Dr. Clelia Croft is his oncologist. He had his GB removed 25-30 years ago done open. He has a h/o of a left groin hernia and an incisional hernia on the right paramidline from his cholecystectomy, but is asymptomatic. He has seen Dr. Corliss Skains in the office for possible splenectomy, but the decision was made against surgery. He was seen by Dr. Ezzard Standing, and taken to the OR for the above procedure.  He did well post op.  He was fairly sore the following AM.  He was mobilized, his diet advanced and he was ready for discharge the afternoon of the first post op day.  Condition on d/c:   Improved    Disposition: 01-Home or Self Care   Future Appointments Provider Department Dept Phone   05/31/2013 10:15 AM Beverely Pace [facility] [phone]   05/31/2013 10:45 AM [facility] Procedure 2 [place] [facility] [phone]   06/02/2013 1:00 PM [facility] Inj Nurse [place] [facility] [phone]   06/27/2013 8:45 AM Mauri [place] [facility] [phone]   06/27/2013 9:15 AM Benjiman Core, MD [facility] [place] MED[facility] ONCOLOGY [phone]   06/27/2013 10:15 AM [facility] D[facility] [place] [facility] [phone]   06/28/2013 12:45 PM [facility] B[facility] [place] [facility] [phone]   06/29/2013 1:00 PM [facility] Inj Nurse [place] [facility] [phone]       Medication List         acetaminophen 325 MG tablet  Commonly known as:  TYLENOL  Do not take more than 4000 mg of tylenol (acetaminophen) in any 24 hour period.  There is tylenol in your Vicodin so you need to add it to your daily total.     docusate sodium 100 MG capsule  Commonly known as:  COLACE  Take 100 mg by mouth 2 (two) times daily as needed for constipation.     HYDROcodone-acetaminophen 5-325 MG per tablet  Commonly known as:  NORCO/VICODIN  Take 1 tablet by mouth every 6 (six) hours as needed for pain.     HYDROcodone-acetaminophen 5-325 MG per tablet  Commonly known as:  NORCO/VICODIN  This is the same medicine you have at home, but you can take one or two tablets every 4 hours for pain after your surgery as needed.  Do not allow yourself to have more than 4000 mg of acetaminophen per day.     ibuprofen 200 MG tablet  Commonly known as:  ADVIL,MOTRIN  - You can take two or three tablets every 6 hours as needed for pain.  - Use may use this in place of plain tylenol.     insulin NPH 100 UNIT/ML  injection  Commonly known as:  HUMULIN N,NOVOLIN N  Inject 40 Units into the skin 2 (two) times daily at 8 am and 10 pm.     insulin regular 100 units/mL injection  Commonly known as:  NOVOLIN R,HUMULIN R  Inject 5-30 Units into the skin 3 (three) times daily before meals. Sliding scale 81-120= 5 units, 121-140= 7 units, 141-180-=10 units, 181-240=12 units, 241-280=15 unites, 281-340=22 units, 341-400=25 units, >400=30 units     lovastatin 20 MG tablet  Commonly known as:  MEVACOR  Take 20 mg by mouth at bedtime.     multivitamin with minerals Tabs tablet  Take 1 tablet by mouth daily.     polyethylene glycol packet  Commonly known as:  MIRALAX / GLYCOLAX  Take 17 g by mouth daily.     promethazine 25 MG tablet  Commonly known as:  PHENERGAN  Take 1 tablet (25 mg total) by mouth every 6 (six) hours as needed for nausea.     sertraline 50 MG tablet  Commonly known as:  ZOLOFT  Take 50 mg by mouth daily at 12 noon.     traZODone 100 MG tablet  Commonly known as:  DESYREL  Take 100 mg by mouth at bedtime.           Follow-up Information   Follow up with Ezequiel Kayser, MD.   Specialty:  Internal Medicine   Contact information:   [address] [place] [address] [phone]       Follow up with [facility], MD. Schedule an appointment as soon as possible for a visit in 2 weeks.   Specialty:  General Surgery   Contact information:   [address] [place] [address] [phone]       Follow up with [facility], MD. Schedule an appointment as soon as possible for a visit in 1 week. (Call and see when he wants you to come back.)    Specialty:  Oncology   Contact information:   [address] [place] [address] [phone]       Signed: Sherrie George 05/24/2013, 3:35 PM  Agree with above.  [name], MD, [facility] Pager: [phone] Office phone:  [phone]

## 2013-05-25 ENCOUNTER — Ambulatory Visit: Payer: BC Managed Care – PPO

## 2013-05-26 NOTE — Discharge Summary (Signed)
Physician Discharge Summary  Patient ID: JAZE RODINO MRN: [id number] DOB/AGE: 01-19-1951 62 y.o.  Admit date: 05/23/2013 Discharge date: 05/26/2013  Admission Diagnoses:  Acute appendicitis  Non-Hodgkin's lymphoma  DM, insulin dependent  Non-oxygen dependent COPD 2* smoking hx  Discharge Diagnoses:  Chronic appendicitis   (on the final pathology of the appendix - there was Mantel cell lymphoma in the appendix) Non Hodgkin's Mantle cell lymphoma/on chemotherapy  Splenomegaly  Diabetes mellitus  COPD  Dyslipidemia  GERD  Prior cholecystectomy   Principal Problem:   Appendicitis Active Problems:   Splenomegaly   Non Hodgkin's lymphoma   Diabetes mellitus   Mantle cell lymphoma   Hyperlipidemia   Leukocytosis   Hyperglycemia  PROCEDURES: S/p Laparoscopic appendectomy. Kandis Cocking, MD, 05/23/2013   Hospital Course: 62 y/o male with PMH non-Hodgkin's lymphoma on chemotherapy, IDDM, COPD presents to [facility] complaining of 3 days of worsening RLQ abdominal pain and nausea. He notes he had some discomfort 2-3 weeks ago which resolved after a few days and then returned in the same location over the last few days. He was seen by his oncologist yesterday for his 4th dose of chemo. He was sent for an OP CT scan which was consistent with acute appendicitis. He was instructed to go to the ED yesterday, but waiting until today to be seen at Dr. Alver Fisher office visit. He denies vomiting, fever/chills, CP/SOB.  Of note, his is currently receiving chemotherapy and yesterday would have been his 4th round. Dr. Clelia Croft is his oncologist. He had his GB removed 25-30 years ago done open. He has a h/o of a left groin hernia and an incisional hernia on the right paramidline from his cholecystectomy, but is asymptomatic. He has seen Dr. Corliss Skains in the office for possible splenectomy, but the decision was made against surgery.  He was seen by Dr. Ezzard Standing, and taken to the OR for the above procedure. He did  well post op.  He was fairly sore the following AM. He was mobilized, his diet advanced and he was ready for discharge the afternoon of the first post op day.   Condition on d/c: Improved    Disposition: 01-Home or Self Care   Future Appointments Provider Department Dept Phone   05/31/2013 10:15 AM Beverely Pace [facility] [phone]   05/31/2013 10:45 AM [facility] Procedure 2 [place] [facility] [phone]   06/01/2013 1:00 PM [facility] H31 [place] [facility] [phone]   06/02/2013 1:00 PM [facility] Inj Nurse [place] [facility] [phone]   06/27/2013 8:45 AM Mauri [facility] [phone]   06/27/2013 9:15 AM Benjiman Core, MD [facility] MEDICAL ONCOLOGY [phone]   06/27/2013 10:15 AM [facility] D[facility] [place] [facility] [phone]   06/28/2013 12:45 PM [facility] B6 [place] [facility] [phone]   06/29/2013 1:00 PM [facility] In[facility] Nurse [place] [facility] [phone]       Medication List         acetaminophen 325 MG tablet  Commonly known as:  TYLENOL  Do not take more than 4000 mg of tylenol (acetaminophen) in any 24 hour period.  There is tylenol in your Vicodin so you need to add it to your daily total.     docusate sodium 100 MG capsule  Commonly known as:  COLACE  Take 100 mg by mouth 2 (two) times  daily as needed for constipation.     HYDROcodone-acetaminophen 5-325 MG per tablet  Commonly known as:  NORCO/VICODIN  Take 1 tablet by mouth every 6 (six) hours as needed for pain.     HYDROcodone-acetaminophen 5-325 MG per tablet  Commonly known as:  NORCO/VICODIN  This is the same medicine you have at home, but you can take one or two tablets every 4 hours for pain after your surgery as needed.  Do not allow yourself  to have more than 4000 mg of acetaminophen per day.     ibuprofen 200 MG tablet  Commonly known as:  ADVIL,MOTRIN  - You can take two or three tablets every 6 hours as needed for pain.  - Use may use this in place of plain tylenol.     insulin NPH 100 UNIT/ML injection  Commonly known as:  HUMULIN N,NOVOLIN N  Inject 40 Units into the skin 2 (two) times daily at 8 am and 10 pm.     insulin regular 100 units/mL injection  Commonly known as:  NOVOLIN R,HUMULIN R  Inject 5-30 Units into the skin 3 (three) times daily before meals. Sliding scale 81-120= 5 units, 121-140= 7 units, 141-180-=10 units, 181-240=12 units, 241-280=15 unites, 281-340=22 units, 341-400=25 units, >400=30 units     lovastatin 20 MG tablet  Commonly known as:  MEVACOR  Take 20 mg by mouth at bedtime.     multivitamin with minerals Tabs tablet  Take 1 tablet by mouth daily.     polyethylene glycol packet  Commonly known as:  MIRALAX / GLYCOLAX  Take 17 g by mouth daily.     promethazine 25 MG tablet  Commonly known as:  PHENERGAN  Take 1 tablet (25 mg total) by mouth every 6 (six) hours as needed for nausea.     sertraline 50 MG tablet  Commonly known as:  ZOLOFT  Take 50 mg by mouth daily at 12 noon.     traZODone 100 MG tablet  Commonly known as:  DESYREL  Take 100 mg by mouth at bedtime.           Follow-up Information   Follow up with Ezequiel Kayser, MD.   Specialty:  Internal Medicine   Contact information:   [address] [place] [address] [phone]       Follow up with [facility], MD. Schedule an appointment as soon as possible for a visit in 2 weeks.   Specialty:  General Surgery   Contact information:   [address] [place] [address] [phone]       Follow up with [facility], MD. Schedule an appointment as soon as possible for a visit in 1 week. (Call and see when he wants you to come back.)    Specialty:  Oncology   Contact information:   [address] [place] [address] [phone]       Signed: Sherrie George 05/26/2013, 9:30 AM  Agree with above.  [name], MD, [facility] Surgery Pager: [phone] Office phone:  [phone]

## 2013-05-31 ENCOUNTER — Ambulatory Visit (HOSPITAL_BASED_OUTPATIENT_CLINIC_OR_DEPARTMENT_OTHER): Payer: BC Managed Care – PPO

## 2013-05-31 ENCOUNTER — Other Ambulatory Visit (HOSPITAL_BASED_OUTPATIENT_CLINIC_OR_DEPARTMENT_OTHER): Payer: BC Managed Care – PPO | Admitting: Lab

## 2013-05-31 VITALS — BP 158/87 | HR 80 | Temp 97.7°F | Resp 18

## 2013-05-31 DIAGNOSIS — C8590 Non-Hodgkin lymphoma, unspecified, unspecified site: Secondary | ICD-10-CM

## 2013-05-31 DIAGNOSIS — Z5111 Encounter for antineoplastic chemotherapy: Secondary | ICD-10-CM

## 2013-05-31 DIAGNOSIS — R161 Splenomegaly, not elsewhere classified: Secondary | ICD-10-CM

## 2013-05-31 DIAGNOSIS — C8588 Other specified types of non-Hodgkin lymphoma, lymph nodes of multiple sites: Secondary | ICD-10-CM

## 2013-05-31 DIAGNOSIS — Z5112 Encounter for antineoplastic immunotherapy: Secondary | ICD-10-CM

## 2013-05-31 LAB — COMPREHENSIVE METABOLIC PANEL (CC13)
AST: 18 U/L (ref 5–34)
Albumin: 3.5 g/dL (ref 3.5–5.0)
Alkaline Phosphatase: 118 U/L (ref 40–150)
BUN: 17.7 mg/dL (ref 7.0–26.0)
Creatinine: 1.1 mg/dL (ref 0.7–1.3)
Potassium: 4.6 mEq/L (ref 3.5–5.1)

## 2013-05-31 LAB — CBC WITH DIFFERENTIAL/PLATELET
BASO%: 0.3 % (ref 0.0–2.0)
EOS%: 1.9 % (ref 0.0–7.0)
HCT: 38 % — ABNORMAL LOW (ref 38.4–49.9)
LYMPH%: 5.6 % — ABNORMAL LOW (ref 14.0–49.0)
MCH: 30.5 pg (ref 27.2–33.4)
MCHC: 33.9 g/dL (ref 32.0–36.0)
NEUT%: 88.6 % — ABNORMAL HIGH (ref 39.0–75.0)
Platelets: 176 10*3/uL (ref 140–400)
RDW: 14.5 % (ref 11.0–14.6)
lymph#: 0.6 10*3/uL — ABNORMAL LOW (ref 0.9–3.3)

## 2013-05-31 MED ORDER — ONDANSETRON 8 MG/50ML IVPB (CHCC)
8.0000 mg | Freq: Once | INTRAVENOUS | Status: AC
  Administered 2013-05-31: 8 mg via INTRAVENOUS

## 2013-05-31 MED ORDER — DEXAMETHASONE SODIUM PHOSPHATE 10 MG/ML IJ SOLN
10.0000 mg | Freq: Once | INTRAMUSCULAR | Status: AC
  Administered 2013-05-31: 10 mg via INTRAVENOUS

## 2013-05-31 MED ORDER — ONDANSETRON 8 MG/NS 50 ML IVPB
INTRAVENOUS | Status: AC
  Filled 2013-05-31: qty 8

## 2013-05-31 MED ORDER — DEXAMETHASONE SODIUM PHOSPHATE 10 MG/ML IJ SOLN
INTRAMUSCULAR | Status: AC
  Filled 2013-05-31: qty 1

## 2013-05-31 MED ORDER — ACETAMINOPHEN 325 MG PO TABS
650.0000 mg | ORAL_TABLET | Freq: Once | ORAL | Status: AC
  Administered 2013-05-31: 650 mg via ORAL

## 2013-05-31 MED ORDER — SODIUM CHLORIDE 0.9 % IV SOLN
375.0000 mg/m2 | Freq: Once | INTRAVENOUS | Status: AC
  Administered 2013-05-31: 900 mg via INTRAVENOUS
  Filled 2013-05-31: qty 90

## 2013-05-31 MED ORDER — DIPHENHYDRAMINE HCL 25 MG PO CAPS
50.0000 mg | ORAL_CAPSULE | Freq: Once | ORAL | Status: AC
  Administered 2013-05-31: 50 mg via ORAL

## 2013-05-31 MED ORDER — DIPHENHYDRAMINE HCL 25 MG PO CAPS
ORAL_CAPSULE | ORAL | Status: AC
  Filled 2013-05-31: qty 2

## 2013-05-31 MED ORDER — SODIUM CHLORIDE 0.9 % IV SOLN
Freq: Once | INTRAVENOUS | Status: AC
  Administered 2013-05-31: 11:00:00 via INTRAVENOUS

## 2013-05-31 MED ORDER — ACETAMINOPHEN 325 MG PO TABS
ORAL_TABLET | ORAL | Status: AC
  Filled 2013-05-31: qty 2

## 2013-05-31 MED ORDER — SODIUM CHLORIDE 0.9 % IV SOLN
90.0000 mg/m2 | Freq: Once | INTRAVENOUS | Status: AC
  Administered 2013-05-31: 220 mg via INTRAVENOUS
  Filled 2013-05-31: qty 44

## 2013-05-31 NOTE — Patient Instructions (Signed)
[facility] Discharge Instructions for Patients Receiving Chemotherapy  Today you received the following chemotherapy agents: Rituxan, Treanda  To help prevent nausea and vomiting after your treatment, we encourage you to take your nausea medication as prescribed.    If you develop nausea and vomiting that is not controlled by your nausea medication, call the clinic.   BELOW ARE SYMPTOMS THAT SHOULD BE REPORTED IMMEDIATELY:  *FEVER GREATER THAN 100.5 F  *CHILLS WITH OR WITHOUT FEVER  NAUSEA AND VOMITING THAT IS NOT CONTROLLED WITH YOUR NAUSEA MEDICATION  *UNUSUAL SHORTNESS OF BREATH  *UNUSUAL BRUISING OR BLEEDING  TENDERNESS IN MOUTH AND THROAT WITH OR WITHOUT PRESENCE OF ULCERS  *URINARY PROBLEMS  *BOWEL PROBLEMS  UNUSUAL RASH Items with * indicate a potential emergency and should be followed up as soon as possible.  Feel free to call the clinic you have any questions or concerns. The clinic phone number is [phone].    

## 2013-06-01 ENCOUNTER — Ambulatory Visit: Payer: BC Managed Care – PPO

## 2013-06-01 ENCOUNTER — Ambulatory Visit (HOSPITAL_BASED_OUTPATIENT_CLINIC_OR_DEPARTMENT_OTHER): Payer: BC Managed Care – PPO

## 2013-06-01 ENCOUNTER — Other Ambulatory Visit: Payer: Self-pay | Admitting: *Deleted

## 2013-06-01 VITALS — BP 124/63 | HR 75 | Temp 98.4°F

## 2013-06-01 DIAGNOSIS — R161 Splenomegaly, not elsewhere classified: Secondary | ICD-10-CM

## 2013-06-01 DIAGNOSIS — Z5111 Encounter for antineoplastic chemotherapy: Secondary | ICD-10-CM

## 2013-06-01 DIAGNOSIS — C8588 Other specified types of non-Hodgkin lymphoma, lymph nodes of multiple sites: Secondary | ICD-10-CM

## 2013-06-01 MED ORDER — SODIUM CHLORIDE 0.9 % IV SOLN
90.0000 mg/m2 | Freq: Once | INTRAVENOUS | Status: AC
  Administered 2013-06-01: 220 mg via INTRAVENOUS
  Filled 2013-06-01: qty 44

## 2013-06-01 MED ORDER — PROMETHAZINE HCL 25 MG PO TABS: 25.0000 mg | ORAL_TABLET | Freq: Four times a day (QID) | ORAL | Status: DC | PRN

## 2013-06-01 MED ORDER — HYDROCODONE-ACETAMINOPHEN 5-325 MG PO TABS: ORAL_TABLET | ORAL | Status: DC

## 2013-06-01 MED ORDER — ONDANSETRON 8 MG/NS 50 ML IVPB
INTRAVENOUS | Status: AC
  Filled 2013-06-01: qty 8

## 2013-06-01 MED ORDER — SODIUM CHLORIDE 0.9 % IV SOLN
Freq: Once | INTRAVENOUS | Status: AC
  Administered 2013-06-01: 14:00:00 via INTRAVENOUS

## 2013-06-01 MED ORDER — DEXAMETHASONE SODIUM PHOSPHATE 10 MG/ML IJ SOLN
INTRAMUSCULAR | Status: AC
  Filled 2013-06-01: qty 1

## 2013-06-01 MED ORDER — ONDANSETRON 8 MG/50ML IVPB (CHCC)
8.0000 mg | Freq: Once | INTRAVENOUS | Status: AC
  Administered 2013-06-01: 8 mg via INTRAVENOUS

## 2013-06-01 MED ORDER — DEXAMETHASONE SODIUM PHOSPHATE 10 MG/ML IJ SOLN
10.0000 mg | Freq: Once | INTRAMUSCULAR | Status: AC
  Administered 2013-06-01: 10 mg via INTRAVENOUS

## 2013-06-01 NOTE — Patient Instructions (Signed)
[facility] Discharge Instructions for Patients Receiving Chemotherapy  Today you received the following chemotherapy agents:  Treanda  To help prevent nausea and vomiting after your treatment, we encourage you to take your nausea medication as ordered per MD.   If you develop nausea and vomiting that is not controlled by your nausea medication, call the clinic.   BELOW ARE SYMPTOMS THAT SHOULD BE REPORTED IMMEDIATELY:  *FEVER GREATER THAN 100.5 F  *CHILLS WITH OR WITHOUT FEVER  NAUSEA AND VOMITING THAT IS NOT CONTROLLED WITH YOUR NAUSEA MEDICATION  *UNUSUAL SHORTNESS OF BREATH  *UNUSUAL BRUISING OR BLEEDING  TENDERNESS IN MOUTH AND THROAT WITH OR WITHOUT PRESENCE OF ULCERS  *URINARY PROBLEMS  *BOWEL PROBLEMS  UNUSUAL RASH Items with * indicate a potential emergency and should be followed up as soon as possible.  Feel free to call the clinic you have any questions or concerns. The clinic phone number is [phone].    

## 2013-06-01 NOTE — Progress Notes (Signed)
Chaplain made initial encounter. Patient was friendly, explained that he was watching his "disgusting TV," a show called "[employer]." Patient talked about his time in the [employer] and how his travels made him appreciate living in the [place]. He mentioned that he met his wife while in [place] and that he is [place]. Patient was pleasant and seemed to appreciate visit.

## 2013-06-02 ENCOUNTER — Ambulatory Visit (HOSPITAL_BASED_OUTPATIENT_CLINIC_OR_DEPARTMENT_OTHER): Payer: BC Managed Care – PPO

## 2013-06-02 VITALS — BP 122/63 | HR 88 | Temp 98.2°F

## 2013-06-02 DIAGNOSIS — C8588 Other specified types of non-Hodgkin lymphoma, lymph nodes of multiple sites: Secondary | ICD-10-CM

## 2013-06-02 DIAGNOSIS — R161 Splenomegaly, not elsewhere classified: Secondary | ICD-10-CM

## 2013-06-02 MED ORDER — PEGFILGRASTIM INJECTION 6 MG/0.6ML
6.0000 mg | Freq: Once | SUBCUTANEOUS | Status: AC
  Administered 2013-06-02: 6 mg via SUBCUTANEOUS
  Filled 2013-06-02: qty 0.6

## 2013-06-05 ENCOUNTER — Telehealth: Payer: Self-pay | Admitting: *Deleted

## 2013-06-05 NOTE — Telephone Encounter (Signed)
Pt called stating for the past few days he has felt an irregular heart beat."Feels like it is skipping a few beats and then feels like a-fib". Denies chest pain or SOB. Discussed with Kristin Curcio,NP and pharmacist-probably not related to chemo. Encouraged patient to go to PCP or Urgent care to be evaluated. Pt verbalized understanding

## 2013-06-27 ENCOUNTER — Ambulatory Visit (HOSPITAL_BASED_OUTPATIENT_CLINIC_OR_DEPARTMENT_OTHER): Payer: BC Managed Care – PPO | Admitting: Oncology

## 2013-06-27 ENCOUNTER — Ambulatory Visit (HOSPITAL_BASED_OUTPATIENT_CLINIC_OR_DEPARTMENT_OTHER): Payer: BC Managed Care – PPO

## 2013-06-27 ENCOUNTER — Other Ambulatory Visit: Payer: Self-pay | Admitting: Oncology

## 2013-06-27 ENCOUNTER — Other Ambulatory Visit (HOSPITAL_BASED_OUTPATIENT_CLINIC_OR_DEPARTMENT_OTHER): Payer: BC Managed Care – PPO | Admitting: Lab

## 2013-06-27 ENCOUNTER — Telehealth: Payer: Self-pay | Admitting: Oncology

## 2013-06-27 VITALS — BP 113/65 | HR 70 | Temp 97.6°F | Resp 20 | Ht 73.0 in | Wt 273.7 lb

## 2013-06-27 VITALS — BP 147/70 | HR 78 | Temp 97.9°F | Resp 20

## 2013-06-27 DIAGNOSIS — C8590 Non-Hodgkin lymphoma, unspecified, unspecified site: Secondary | ICD-10-CM

## 2013-06-27 DIAGNOSIS — C8588 Other specified types of non-Hodgkin lymphoma, lymph nodes of multiple sites: Secondary | ICD-10-CM

## 2013-06-27 DIAGNOSIS — R161 Splenomegaly, not elsewhere classified: Secondary | ICD-10-CM

## 2013-06-27 DIAGNOSIS — Z5112 Encounter for antineoplastic immunotherapy: Secondary | ICD-10-CM

## 2013-06-27 DIAGNOSIS — Z5111 Encounter for antineoplastic chemotherapy: Secondary | ICD-10-CM

## 2013-06-27 DIAGNOSIS — D709 Neutropenia, unspecified: Secondary | ICD-10-CM

## 2013-06-27 LAB — CBC WITH DIFFERENTIAL/PLATELET
BASO%: 1.8 % (ref 0.0–2.0)
Basophils Absolute: 0.1 10*3/uL (ref 0.0–0.1)
Eosinophils Absolute: 0.2 10*3/uL (ref 0.0–0.5)
HCT: 39.9 % (ref 38.4–49.9)
HGB: 14.2 g/dL (ref 13.0–17.1)
LYMPH%: 17.7 % (ref 14.0–49.0)
MCH: 30.9 pg (ref 27.2–33.4)
MCV: 86.9 fL (ref 79.3–98.0)
MONO#: 0.6 10*3/uL (ref 0.1–0.9)
MONO%: 14.1 % — ABNORMAL HIGH (ref 0.0–14.0)
NEUT#: 2.8 10*3/uL (ref 1.5–6.5)
Platelets: 225 10*3/uL (ref 140–400)
RBC: 4.59 10*6/uL (ref 4.20–5.82)
RDW: 13.5 % (ref 11.0–14.6)

## 2013-06-27 LAB — COMPREHENSIVE METABOLIC PANEL (CC13)
Albumin: 3.7 g/dL (ref 3.5–5.0)
Alkaline Phosphatase: 129 U/L (ref 40–150)
Anion Gap: 10 mEq/L (ref 3–11)
BUN: 13.8 mg/dL (ref 7.0–26.0)
CO2: 23 mEq/L (ref 22–29)
Calcium: 9.1 mg/dL (ref 8.4–10.4)
Glucose: 170 mg/dl — ABNORMAL HIGH (ref 70–140)
Potassium: 4.5 mEq/L (ref 3.5–5.1)
Total Bilirubin: 0.43 mg/dL (ref 0.20–1.20)
Total Protein: 6.7 g/dL (ref 6.4–8.3)

## 2013-06-27 MED ORDER — DEXAMETHASONE SODIUM PHOSPHATE 10 MG/ML IJ SOLN
10.0000 mg | Freq: Once | INTRAMUSCULAR | Status: AC
  Administered 2013-06-27: 10 mg via INTRAVENOUS

## 2013-06-27 MED ORDER — DIPHENHYDRAMINE HCL 25 MG PO CAPS
50.0000 mg | ORAL_CAPSULE | Freq: Once | ORAL | Status: AC
  Administered 2013-06-27: 50 mg via ORAL

## 2013-06-27 MED ORDER — DIPHENHYDRAMINE HCL 25 MG PO CAPS
ORAL_CAPSULE | ORAL | Status: AC
  Filled 2013-06-27: qty 2

## 2013-06-27 MED ORDER — SODIUM CHLORIDE 0.9 % IV SOLN
Freq: Once | INTRAVENOUS | Status: AC
  Administered 2013-06-27: 11:00:00 via INTRAVENOUS

## 2013-06-27 MED ORDER — ACETAMINOPHEN 325 MG PO TABS
ORAL_TABLET | ORAL | Status: AC
  Filled 2013-06-27: qty 2

## 2013-06-27 MED ORDER — ONDANSETRON 8 MG/NS 50 ML IVPB
INTRAVENOUS | Status: AC
  Filled 2013-06-27: qty 8

## 2013-06-27 MED ORDER — SODIUM CHLORIDE 0.9 % IV SOLN
375.0000 mg/m2 | Freq: Once | INTRAVENOUS | Status: AC
  Administered 2013-06-27: 900 mg via INTRAVENOUS
  Filled 2013-06-27: qty 90

## 2013-06-27 MED ORDER — ONDANSETRON 8 MG/50ML IVPB (CHCC)
8.0000 mg | Freq: Once | INTRAVENOUS | Status: AC
  Administered 2013-06-27: 8 mg via INTRAVENOUS

## 2013-06-27 MED ORDER — BENDAMUSTINE HCL (LYOPHILIZED PWD) CHEMO INJECTION 100MG
90.0000 mg/m2 | Freq: Once | INTRAVENOUS | Status: AC
  Administered 2013-06-27: 220 mg via INTRAVENOUS
  Filled 2013-06-27: qty 44

## 2013-06-27 MED ORDER — DEXAMETHASONE SODIUM PHOSPHATE 10 MG/ML IJ SOLN
INTRAMUSCULAR | Status: AC
  Filled 2013-06-27: qty 1

## 2013-06-27 MED ORDER — ACETAMINOPHEN 325 MG PO TABS
650.0000 mg | ORAL_TABLET | Freq: Once | ORAL | Status: AC
  Administered 2013-06-27: 650 mg via ORAL

## 2013-06-27 NOTE — Telephone Encounter (Signed)
gv pt appt schedule for November and December.  °

## 2013-06-27 NOTE — Patient Instructions (Signed)
 [facility] Discharge Instructions for Patients Receiving Chemotherapy  Today you received the following chemotherapy agents treanda, rituxan  To help prevent nausea and vomiting after your treatment, we encourage you to take your nausea medication as needed If you develop nausea and vomiting that is not controlled by your nausea medication, call the clinic.   BELOW ARE SYMPTOMS THAT SHOULD BE REPORTED IMMEDIATELY:  *FEVER GREATER THAN 100.5 F  *CHILLS WITH OR WITHOUT FEVER  NAUSEA AND VOMITING THAT IS NOT CONTROLLED WITH YOUR NAUSEA MEDICATION  *UNUSUAL SHORTNESS OF BREATH  *UNUSUAL BRUISING OR BLEEDING  TENDERNESS IN MOUTH AND THROAT WITH OR WITHOUT PRESENCE OF ULCERS  *URINARY PROBLEMS  *BOWEL PROBLEMS  UNUSUAL RASH Items with * indicate a potential emergency and should be followed up as soon as possible.  Feel free to call the clinic you have any questions or concerns. The clinic phone number is [phone].

## 2013-06-27 NOTE — Progress Notes (Signed)
Hematology and Oncology Follow Up Visit  Micheal Thompson [id number] 02-04-1951 62 y.o. 06/27/2013 9:21 AM   Principle Diagnosis: 62 year old gentleman  presented with splenomegaly and lymphadenopathy. His workup showed mantle cell lymphoma diagnosed in July of 2014.  Current therapy: Chemotherapy with  bendamustine and rituximab started on 02/28/2013. He is here for cycle 5.   Interim History: Micheal Thompson presents today for a followup visit. He is a pleasant 62 year old gentleman with mantle cell lymphoma. He underwent the first 4 cycles of B-R chemotherapy without complications. He did develop appendicitis and required surgical resection on 05/23/2013 without any complications. Since his operation, he has not reported any abdominal pain at this time. He did have a minor infusion reaction the first treatment but overall did well. He reports less abdominal fullness and early satiety. He reports that his left upper quadrant pain that was present at the time of diagnosis has significantly improved.  Medications: I have reviewed the patient's current medications.  Current Outpatient Prescriptions  Medication Sig Dispense Refill  . acetaminophen (TYLENOL) 325 MG tablet Do not take more than 4000 mg of tylenol (acetaminophen) in any 24 hour period.  There is tylenol in your Vicodin so you need to add it to your daily total.      . docusate sodium (COLACE) 100 MG capsule Take 100 mg by mouth 2 (two) times daily as needed for constipation.       Marland Kitchen HYDROcodone-acetaminophen (NORCO/VICODIN) 5-325 MG per tablet This is the same medicine you have at home, but you can take one or two tablets every 4 hours for pain after your surgery as needed.  Do not allow yourself to have more than 4000 mg of acetaminophen per day.  40 tablet  0  . ibuprofen (ADVIL,MOTRIN) 200 MG tablet You can take two or three tablets every 6 hours as needed for pain. Use may use this in place of plain tylenol.  30 tablet  0  . insulin NPH  (HUMULIN N,NOVOLIN N) 100 UNIT/ML injection Inject 40 Units into the skin 2 (two) times daily at 8 am and 10 pm.      . insulin regular (NOVOLIN R,HUMULIN R) 100 units/mL injection Inject 5-30 Units into the skin 3 (three) times daily before meals. Sliding scale 81-120= 5 units, 121-140= 7 units, 141-180-=10 units, 181-240=12 units, 241-280=15 unites, 281-340=22 units, 341-400=25 units, >400=30 units      . lovastatin (MEVACOR) 20 MG tablet Take 20 mg by mouth at bedtime.      . Multiple Vitamin (MULTIVITAMIN WITH MINERALS) TABS Take 1 tablet by mouth daily.      . polyethylene glycol (MIRALAX / GLYCOLAX) packet Take 17 g by mouth daily.      . promethazine (PHENERGAN) 25 MG tablet Take 1 tablet (25 mg total) by mouth every 6 (six) hours as needed for nausea.  30 tablet  0  . sertraline (ZOLOFT) 50 MG tablet Take 50 mg by mouth daily at 12 noon.       . traZODone (DESYREL) 100 MG tablet Take 100 mg by mouth at bedtime.       No current facility-administered medications for this visit.     Allergies:  Allergies  Allergen Reactions  . Penicillins Other (See Comments)    unknown    Past Medical History, Surgical history, Social history, and Family History were reviewed and updated.  Review of Systems:  Remaining ROS negative. Physical Exam: Blood pressure 113/65, pulse 70, temperature 97.6 F (36.4 C), temperature  source Oral, resp. rate 20, height 6\' 1"  (1.854 m), weight 273 lb 11.2 oz (124.15 kg). ECOG:1  General appearance: alert Head: Normocephalic, without obvious abnormality, atraumatic Neck: no adenopathy, no carotid bruit, no JVD, supple, symmetrical, trachea midline and thyroid not enlarged, symmetric, no tenderness/mass/nodules Lymph nodes: Cervical, supraclavicular, and axillary nodes normal. Heart:regular rate and rhythm, S1, S2 normal, no murmur, click, rub or gallop Lung:chest clear, no wheezing, rales, normal symmetric air entry Abdomin: Dramatically improved  splenomegaly on exam today. EXT:no erythema, induration, or nodules   Lab Results: Lab Results  Component Value Date   WBC 4.5 06/27/2013   HGB 14.2 06/27/2013   HCT 39.9 06/27/2013   MCV 86.9 06/27/2013   PLT 225 06/27/2013     Chemistry      Component Value Date/Time   NA 135* 05/31/2013 1023   NA 134* 05/23/2013 1050   K 4.6 05/31/2013 1023   K 4.2 05/23/2013 1050   CL 98 05/23/2013 1050   CO2 18* 05/31/2013 1023   CO2 24 05/23/2013 1050   BUN 17.7 05/31/2013 1023   BUN 27* 05/23/2013 1050   CREATININE 1.1 05/31/2013 1023   CREATININE 0.91 05/23/2013 1050      Component Value Date/Time   CALCIUM 8.9 05/31/2013 1023   CALCIUM 9.2 05/23/2013 1050   ALKPHOS 118 05/31/2013 1023   ALKPHOS 115 08/22/2009 2117   AST 18 05/31/2013 1023   AST 19 08/22/2009 2117   ALT 16 05/31/2013 1023   ALT 20 08/22/2009 2117   BILITOT 0.45 05/31/2013 1023   BILITOT 0.4 08/22/2009 2117       Impression and Plan:  62 year old gentleman with the following issues:  1. Diagnosis of non-Hodgkin's lymphoma presented with massive splenomegaly and extensive upper abdominal lymphadenopathy with a diagnosis confirmed in July of 2014. He is status post 4 cycles of bendamustine and rituximab and the CT scan on 05/22/2013 showed overall improved disease. He is splenic measurements are dramatically decreased compared to his initial scan in June of 2014. The spleen at its largest diameter was around 22 cm now it's down to 16. The pelvic adenopathy and the caval adenopathy described in the CT scan in June of 2014 is no longer described on the current CT scan. The plan is to proceed with cycle 5 of chemotherapy today and cycle 6 next month and we'll repeat imaging studies at that time.  2. Incidental finding of enlarged and inflamed appendix with clinical findings suggesting acute appendicitis: He is status post appendectomy without any complications.  3. Neutropenia: I will add Neulasta after each cycle.  His white cell count is adequate and I see no contraindication to any surgical intervention at this time  2. Followup: in 4 weeks for the next cycle of chemotherapy  SHADAD,FIRAS, MD 11/18/20149:21 AM

## 2013-06-28 ENCOUNTER — Ambulatory Visit (HOSPITAL_BASED_OUTPATIENT_CLINIC_OR_DEPARTMENT_OTHER): Payer: BC Managed Care – PPO

## 2013-06-28 VITALS — BP 168/81 | HR 78 | Temp 97.5°F | Resp 20

## 2013-06-28 DIAGNOSIS — R161 Splenomegaly, not elsewhere classified: Secondary | ICD-10-CM

## 2013-06-28 DIAGNOSIS — Z5111 Encounter for antineoplastic chemotherapy: Secondary | ICD-10-CM

## 2013-06-28 DIAGNOSIS — C8588 Other specified types of non-Hodgkin lymphoma, lymph nodes of multiple sites: Secondary | ICD-10-CM

## 2013-06-28 MED ORDER — SODIUM CHLORIDE 0.9 % IV SOLN
Freq: Once | INTRAVENOUS | Status: AC
  Administered 2013-06-28: 13:00:00 via INTRAVENOUS

## 2013-06-28 MED ORDER — DEXAMETHASONE SODIUM PHOSPHATE 10 MG/ML IJ SOLN
INTRAMUSCULAR | Status: AC
  Filled 2013-06-28: qty 1

## 2013-06-28 MED ORDER — SODIUM CHLORIDE 0.9 % IV SOLN
90.0000 mg/m2 | Freq: Once | INTRAVENOUS | Status: AC
  Administered 2013-06-28: 220 mg via INTRAVENOUS
  Filled 2013-06-28: qty 44

## 2013-06-28 MED ORDER — ONDANSETRON 8 MG/NS 50 ML IVPB
INTRAVENOUS | Status: AC
  Filled 2013-06-28: qty 8

## 2013-06-28 MED ORDER — ONDANSETRON 8 MG/50ML IVPB (CHCC)
8.0000 mg | Freq: Once | INTRAVENOUS | Status: AC
  Administered 2013-06-28: 8 mg via INTRAVENOUS

## 2013-06-28 MED ORDER — DEXAMETHASONE SODIUM PHOSPHATE 10 MG/ML IJ SOLN
10.0000 mg | Freq: Once | INTRAMUSCULAR | Status: AC
  Administered 2013-06-28: 10 mg via INTRAVENOUS

## 2013-06-28 NOTE — Patient Instructions (Signed)
[facility] Discharge Instructions for Patients Receiving Chemotherapy  Today you received the following chemotherapy agents :  Treanda.  To help prevent nausea and vomiting after your treatment, we encourage you to take your nausea medication as instructed by your physician.   If you develop nausea and vomiting that is not controlled by your nausea medication, call the clinic.   BELOW ARE SYMPTOMS THAT SHOULD BE REPORTED IMMEDIATELY:  *FEVER GREATER THAN 100.5 F  *CHILLS WITH OR WITHOUT FEVER  NAUSEA AND VOMITING THAT IS NOT CONTROLLED WITH YOUR NAUSEA MEDICATION  *UNUSUAL SHORTNESS OF BREATH  *UNUSUAL BRUISING OR BLEEDING  TENDERNESS IN MOUTH AND THROAT WITH OR WITHOUT PRESENCE OF ULCERS  *URINARY PROBLEMS  *BOWEL PROBLEMS  UNUSUAL RASH Items with * indicate a potential emergency and should be followed up as soon as possible.  Feel free to call the clinic you have any questions or concerns. The clinic phone number is [phone].

## 2013-06-29 ENCOUNTER — Ambulatory Visit (HOSPITAL_BASED_OUTPATIENT_CLINIC_OR_DEPARTMENT_OTHER): Payer: BC Managed Care – PPO

## 2013-06-29 VITALS — BP 135/63 | HR 76 | Temp 97.0°F | Resp 20

## 2013-06-29 DIAGNOSIS — C8588 Other specified types of non-Hodgkin lymphoma, lymph nodes of multiple sites: Secondary | ICD-10-CM

## 2013-06-29 DIAGNOSIS — R161 Splenomegaly, not elsewhere classified: Secondary | ICD-10-CM

## 2013-06-29 DIAGNOSIS — Z5189 Encounter for other specified aftercare: Secondary | ICD-10-CM

## 2013-06-29 MED ORDER — PEGFILGRASTIM INJECTION 6 MG/0.6ML
6.0000 mg | Freq: Once | SUBCUTANEOUS | Status: AC
  Administered 2013-06-29: 6 mg via SUBCUTANEOUS
  Filled 2013-06-29: qty 0.6

## 2013-07-12 ENCOUNTER — Telehealth: Payer: Self-pay | Admitting: *Deleted

## 2013-07-12 NOTE — Telephone Encounter (Signed)
Patient called reporting he feels tired for the past 7 to 8 days.  06-27-2013 through 06-28-2013 received cycle five Rituxan/Treanda with neulasta support.  "I'm extremely weak and tired all day long.  I wake up feeling worn out.  I've never felt this way after previous treatments.  It's like I have the flu but I don't.  I have a history of insomnia and take 100 mg trazodone and sleep four hours.  I'm diabetic and get up to use bathroom."  Reports being so tired he has "not been out of the house.  Body aches and off balance when I walk."  Denies fever, has COPD and denies any new respiratory symptoms.  Will notify providers.  This nurse advised to continue drinking water for hydration, take naps if able to sleep during the day and eat small meals or snacks to boost his energy.

## 2013-07-20 ENCOUNTER — Telehealth: Payer: Self-pay | Admitting: *Deleted

## 2013-07-20 NOTE — Telephone Encounter (Signed)
Spoke with patient's mother and asked if she could go to patient's home, he is threatening suicide with a gun, states he is in so much pain. Mother states she has a broken arm and cannot go to him, this rn called 911 and reported this.

## 2013-07-25 ENCOUNTER — Emergency Department (HOSPITAL_COMMUNITY)
Admission: EM | Admit: 2013-07-25 | Discharge: 2013-07-25 | Disposition: A | Discharge status: Home / Self Care | Payer: BC Managed Care – PPO | Attending: Emergency Medicine | Admitting: Emergency Medicine

## 2013-07-25 ENCOUNTER — Encounter (HOSPITAL_COMMUNITY): Payer: Self-pay | Admitting: Emergency Medicine

## 2013-07-25 ENCOUNTER — Telehealth: Payer: Self-pay | Admitting: Oncology

## 2013-07-25 ENCOUNTER — Ambulatory Visit (HOSPITAL_BASED_OUTPATIENT_CLINIC_OR_DEPARTMENT_OTHER): Payer: BC Managed Care – PPO | Admitting: Oncology

## 2013-07-25 ENCOUNTER — Other Ambulatory Visit (HOSPITAL_BASED_OUTPATIENT_CLINIC_OR_DEPARTMENT_OTHER): Payer: BC Managed Care – PPO

## 2013-07-25 ENCOUNTER — Other Ambulatory Visit: Payer: Self-pay

## 2013-07-25 ENCOUNTER — Ambulatory Visit: Payer: BC Managed Care – PPO

## 2013-07-25 ENCOUNTER — Emergency Department (HOSPITAL_COMMUNITY): Payer: BC Managed Care – PPO

## 2013-07-25 VITALS — BP 166/80 | HR 114 | Temp 98.4°F | Resp 18 | Ht 73.0 in | Wt 263.8 lb

## 2013-07-25 DIAGNOSIS — Z860100 Personal history of colon polyps, unspecified: Secondary | ICD-10-CM | POA: Insufficient documentation

## 2013-07-25 DIAGNOSIS — R739 Hyperglycemia, unspecified: Secondary | ICD-10-CM

## 2013-07-25 DIAGNOSIS — E119 Type 2 diabetes mellitus without complications: Secondary | ICD-10-CM | POA: Insufficient documentation

## 2013-07-25 DIAGNOSIS — J449 Chronic obstructive pulmonary disease, unspecified: Secondary | ICD-10-CM | POA: Insufficient documentation

## 2013-07-25 DIAGNOSIS — C8590 Non-Hodgkin lymphoma, unspecified, unspecified site: Secondary | ICD-10-CM

## 2013-07-25 DIAGNOSIS — R5381 Other malaise: Secondary | ICD-10-CM

## 2013-07-25 DIAGNOSIS — J4489 Other specified chronic obstructive pulmonary disease: Secondary | ICD-10-CM | POA: Insufficient documentation

## 2013-07-25 DIAGNOSIS — Z87898 Personal history of other specified conditions: Secondary | ICD-10-CM | POA: Insufficient documentation

## 2013-07-25 DIAGNOSIS — Z880 Allergy status to penicillin: Secondary | ICD-10-CM | POA: Insufficient documentation

## 2013-07-25 DIAGNOSIS — Z79899 Other long term (current) drug therapy: Secondary | ICD-10-CM | POA: Insufficient documentation

## 2013-07-25 DIAGNOSIS — R000 Tachycardia, unspecified: Secondary | ICD-10-CM | POA: Insufficient documentation

## 2013-07-25 DIAGNOSIS — R6883 Chills (without fever): Secondary | ICD-10-CM | POA: Insufficient documentation

## 2013-07-25 DIAGNOSIS — E785 Hyperlipidemia, unspecified: Secondary | ICD-10-CM | POA: Insufficient documentation

## 2013-07-25 DIAGNOSIS — Z9889 Other specified postprocedural states: Secondary | ICD-10-CM | POA: Insufficient documentation

## 2013-07-25 DIAGNOSIS — Z87891 Personal history of nicotine dependence: Secondary | ICD-10-CM | POA: Insufficient documentation

## 2013-07-25 DIAGNOSIS — C8319 Mantle cell lymphoma, extranodal and solid organ sites: Secondary | ICD-10-CM

## 2013-07-25 DIAGNOSIS — Z794 Long term (current) use of insulin: Secondary | ICD-10-CM | POA: Insufficient documentation

## 2013-07-25 DIAGNOSIS — F3289 Other specified depressive episodes: Secondary | ICD-10-CM | POA: Insufficient documentation

## 2013-07-25 DIAGNOSIS — C8589 Other specified types of non-Hodgkin lymphoma, extranodal and solid organ sites: Secondary | ICD-10-CM

## 2013-07-25 DIAGNOSIS — D709 Neutropenia, unspecified: Secondary | ICD-10-CM

## 2013-07-25 DIAGNOSIS — C8310 Mantle cell lymphoma, unspecified site: Secondary | ICD-10-CM

## 2013-07-25 DIAGNOSIS — M2550 Pain in unspecified joint: Secondary | ICD-10-CM | POA: Insufficient documentation

## 2013-07-25 DIAGNOSIS — F329 Major depressive disorder, single episode, unspecified: Secondary | ICD-10-CM | POA: Insufficient documentation

## 2013-07-25 DIAGNOSIS — R531 Weakness: Secondary | ICD-10-CM

## 2013-07-25 DIAGNOSIS — K219 Gastro-esophageal reflux disease without esophagitis: Secondary | ICD-10-CM | POA: Insufficient documentation

## 2013-07-25 DIAGNOSIS — C8588 Other specified types of non-Hodgkin lymphoma, lymph nodes of multiple sites: Secondary | ICD-10-CM

## 2013-07-25 DIAGNOSIS — Z87448 Personal history of other diseases of urinary system: Secondary | ICD-10-CM | POA: Insufficient documentation

## 2013-07-25 DIAGNOSIS — I10 Essential (primary) hypertension: Secondary | ICD-10-CM | POA: Insufficient documentation

## 2013-07-25 LAB — CBC WITH DIFFERENTIAL/PLATELET
BASO%: 0.2 % (ref 0.0–2.0)
Basophils Relative: 0 % (ref 0–1)
Eosinophils Absolute: 0.4 10*3/uL (ref 0.0–0.5)
HCT: 40.7 % (ref 38.4–49.9)
HCT: 38.2 % — ABNORMAL LOW (ref 39.0–52.0)
Hemoglobin: 13.6 g/dL (ref 13.0–17.0)
Lymphocytes Relative: 4 % — ABNORMAL LOW (ref 12–46)
Lymphs Abs: 0.6 10*3/uL — ABNORMAL LOW (ref 0.7–4.0)
MCH: 31.1 pg (ref 27.2–33.4)
MCHC: 36.1 g/dL — ABNORMAL HIGH (ref 32.0–36.0)
MCV: 86 fL (ref 78.0–100.0)
MONO#: 0.9 10*3/uL (ref 0.1–0.9)
Monocytes Absolute: 0.7 10*3/uL (ref 0.1–1.0)
Monocytes Relative: 4 % (ref 3–12)
NEUT#: 16.2 10*3/uL — ABNORMAL HIGH (ref 1.5–6.5)
NEUT%: 88.9 % — ABNORMAL HIGH (ref 39.0–75.0)
Neutro Abs: 14.7 10*3/uL — ABNORMAL HIGH (ref 1.7–7.7)
Neutrophils Relative %: 91 % — ABNORMAL HIGH (ref 43–77)
RBC: 4.44 MIL/uL (ref 4.22–5.81)
RDW: 13.7 % (ref 11.0–14.6)
RDW: 13.5 % (ref 11.5–15.5)
WBC: 18.2 10*3/uL — ABNORMAL HIGH (ref 4.0–10.3)
WBC: 16.2 10*3/uL — ABNORMAL HIGH (ref 4.0–10.5)
lymph#: 0.7 10*3/uL — ABNORMAL LOW (ref 0.9–3.3)
nRBC: 0 % (ref 0–0)

## 2013-07-25 LAB — COMPREHENSIVE METABOLIC PANEL
AST: 18 U/L (ref 0–37)
Albumin: 3.4 g/dL — ABNORMAL LOW (ref 3.5–5.2)
Alkaline Phosphatase: 182 U/L — ABNORMAL HIGH (ref 39–117)
BUN: 23 mg/dL (ref 6–23)
CO2: 19 mEq/L (ref 19–32)
Calcium: 8.9 mg/dL (ref 8.4–10.5)
Chloride: 93 mEq/L — ABNORMAL LOW (ref 96–112)
Creatinine, Ser: 0.95 mg/dL (ref 0.50–1.35)
GFR calc non Af Amer: 88 mL/min — ABNORMAL LOW (ref >90)
Potassium: 4.6 mEq/L (ref 3.5–5.1)
Sodium: 129 mEq/L — ABNORMAL LOW (ref 135–145)
Total Bilirubin: 0.6 mg/dL (ref 0.3–1.2)

## 2013-07-25 LAB — INFLUENZA PANEL BY PCR (TYPE A & B)
Influenza A By PCR: NEGATIVE
Influenza B By PCR: NEGATIVE

## 2013-07-25 LAB — URINALYSIS, ROUTINE W REFLEX MICROSCOPIC
Glucose, UA: >1000 mg/dL — AB
Ketones, ur: NEGATIVE mg/dL
Protein, ur: 100 mg/dL — AB
Specific Gravity, Urine: 1.022 (ref 1.005–1.030)
Urobilinogen, UA: 0.2 mg/dL (ref 0.0–1.0)

## 2013-07-25 LAB — URINE MICROSCOPIC-ADD ON

## 2013-07-25 MED ORDER — SODIUM CHLORIDE 0.9 % IV BOLUS (SEPSIS)
1000.0000 mL | Freq: Once | INTRAVENOUS | Status: AC
  Administered 2013-07-25: 1000 mL via INTRAVENOUS

## 2013-07-25 MED ORDER — SODIUM CHLORIDE 0.9 % IV SOLN
INTRAVENOUS | Status: DC
  Administered 2013-07-25: 10:00:00 via INTRAVENOUS

## 2013-07-25 NOTE — Telephone Encounter (Signed)
pt stopped by my desk wondering what to do since missed long chemo tx today bc he was in ER per Dr Clelia Croft; sw Dr Clelia Croft; he will put in new order and we will call patient once he does this shh

## 2013-07-25 NOTE — ED Provider Notes (Signed)
CSN: [id number]     Arrival date & time 07/25/13  8119 History   First MD Initiated Contact with Patient 07/25/13 [phone]     Chief Complaint  Patient presents with  . Fatigue  . Joint Pain   (Consider location/radiation/quality/duration/timing/severity/associated sxs/prior Treatment) The history is provided by the patient and a relative.   Patient here complaining of a one-month history of intermittent chills and arthralgias since his last chemotherapy treatment. He is being treated for non-Hodgkin's lymphoma. He also notes a productive cough but no reportable fever. No vomiting or diarrhea. No chest or abdominal pain. No headaches or neck pain. No dysuria or hematuria. No rashes noted. Has been using Tylenol when necessary. Went to the cancer Center today and sent here for further evaluation Past Medical History  Diagnosis Date  . H/O colonoscopy 01/13/2013  . Hyperlipidemia   . Diabetes mellitus without complication   . Hypertension   . Depression   . H/O Bell's palsy   . Pancarditis   . ED (erectile dysfunction)   . Colon polyps   . COPD (chronic obstructive pulmonary disease)   . GERD (gastroesophageal reflux disease)   . Non Hodgkin's lymphoma    Past Surgical History  Procedure Laterality Date  . Hernia repair Left   . Shoulder arthroscopy    . Cholecystectomy    . Laparoscopic appendectomy N/A 05/23/2013    Procedure: APPENDECTOMY LAPAROSCOPIC;  Surgeon: Kandis Cocking, MD;  Location: [facility];  Service: General;  Laterality: N/A;   Family History  Problem Relation Age of Onset  . Cancer Maternal Grandmother     unknown  . Heart attack Brother    History  Substance Use Topics  . Smoking status: Former Smoker    Quit date: 08/11/1983  . Smokeless tobacco: Never Used  . Alcohol Use: No    Review of Systems  All other systems reviewed and are negative.    Allergies  Penicillins  Home Medications   Current Outpatient Rx  Name  Route  Sig  Dispense  Refill   . acetaminophen (TYLENOL) 325 MG tablet      Do not take more than 4000 mg of tylenol (acetaminophen) in any 24 hour period.  There is tylenol in your Vicodin so you need to add it to your daily total.         . docusate sodium (COLACE) 100 MG capsule   Oral   Take 100 mg by mouth 2 (two) times daily as needed for constipation.          Marland Kitchen HYDROcodone-acetaminophen (NORCO/VICODIN) 5-325 MG per tablet      This is the same medicine you have at home, but you can take one or two tablets every 4 hours for pain after your surgery as needed.  Do not allow yourself to have more than 4000 mg of acetaminophen per day.   40 tablet   0   . ibuprofen (ADVIL,MOTRIN) 200 MG tablet      You can take two or three tablets every 6 hours as needed for pain. Use may use this in place of plain tylenol.   30 tablet   0   . insulin NPH (HUMULIN N,NOVOLIN N) 100 UNIT/ML injection   Subcutaneous   Inject 40 Units into the skin 2 (two) times daily at 8 am and 10 pm.         . insulin regular (NOVOLIN R,HUMULIN R) 100 units/mL injection   Subcutaneous   Inject 5-30 Units  into the skin 3 (three) times daily before meals. Sliding scale 81-120= 5 units, 121-140= 7 units, 141-180-=10 units, 181-240=12 units, 241-280=15 unites, 281-340=22 units, 341-400=25 units, >400=30 units         . lovastatin (MEVACOR) 20 MG tablet   Oral   Take 20 mg by mouth at bedtime.         . Multiple Vitamin (MULTIVITAMIN WITH MINERALS) TABS   Oral   Take 1 tablet by mouth daily.         . polyethylene glycol (MIRALAX / GLYCOLAX) packet   Oral   Take 17 g by mouth daily.         . promethazine (PHENERGAN) 25 MG tablet   Oral   Take 1 tablet (25 mg total) by mouth every 6 (six) hours as needed for nausea.   30 tablet   0   . sertraline (ZOLOFT) 50 MG tablet   Oral   Take 50 mg by mouth daily at 12 noon.          . traZODone (DESYREL) 100 MG tablet   Oral   Take 100 mg by mouth at bedtime.           BP 149/94  Pulse 108  Temp(Src) 99.7 F (37.6 C) (Oral)  Resp 18  SpO2 94% Physical Exam  Nursing note and vitals reviewed. Constitutional: He is oriented to person, place, and time. He appears well-developed and well-nourished.  Non-toxic appearance. No distress.  HENT:  Head: Normocephalic and atraumatic.  Eyes: Conjunctivae, EOM and lids are normal. Pupils are equal, round, and reactive to light.  Neck: Normal range of motion. Neck supple. No tracheal deviation present. No mass present.  Cardiovascular: Regular rhythm and normal heart sounds.  Tachycardia present.  Exam reveals no gallop.   No murmur heard. Pulmonary/Chest: Effort normal and breath sounds normal. No stridor. No respiratory distress. He has no decreased breath sounds. He has no wheezes. He has no rhonchi. He has no rales.  Abdominal: Soft. Normal appearance and bowel sounds are normal. He exhibits no distension. There is no tenderness. There is no rebound and no CVA tenderness.  Musculoskeletal: Normal range of motion. He exhibits no edema and no tenderness.  Neurological: He is alert and oriented to person, place, and time. He has normal strength. No cranial nerve deficit or sensory deficit. GCS eye subscore is 4. GCS verbal subscore is 5. GCS motor subscore is 6.  Skin: Skin is warm and dry. No abrasion and no rash noted.  Psychiatric: He has a normal mood and affect. His speech is normal and behavior is normal.    ED Course  Procedures (including critical care time) Labs Review Labs Reviewed  CULTURE, BLOOD (ROUTINE X 2)  CULTURE, BLOOD (ROUTINE X 2)  URINE CULTURE  CBC WITH DIFFERENTIAL  COMPREHENSIVE METABOLIC PANEL  URINALYSIS, ROUTINE W REFLEX MICROSCOPIC   Imaging Review No results found.  EKG Interpretation   None       MDM  No diagnosis found.  Date: 07/25/2013  Rate: 105  Rhythm: sinus tachycardia  QRS Axis: normal  Intervals: normal  ST/T Wave abnormalities: normal  Conduction  Disutrbances:none  Narrative Interpretation:   Old EKG Reviewed: none available  2:29 PM  Patient had mild hyperglycemia with a blood sugar of 330 which was treated with 2 L of saline here. States that his weakness has been x1 month since his last chemotherapy treatment. Patient is requesting to go home at this time. Flu test  is pending and will be followed up by his Dr.   Toy Baker, MD 07/25/13 1430

## 2013-07-25 NOTE — Discharge Instructions (Signed)
Hyperglycemia °Hyperglycemia occurs when the glucose (sugar) in your blood is too high. Hyperglycemia can happen for many reasons, but it most often happens to people who do not know they have diabetes or are not managing their diabetes properly.  °CAUSES  °Whether you have diabetes or not, there are other causes of hyperglycemia. Hyperglycemia can occur when you have diabetes, but it can also occur in other situations that you might not be as aware of, such as: °Diabetes °· If you have diabetes and are having problems controlling your blood glucose, hyperglycemia could occur because of some of the following reasons: °· Not following your meal plan. °· Not taking your diabetes medications or not taking it properly. °· Exercising less or doing less activity than you normally do. °· Being sick. °Pre-diabetes °· This cannot be ignored. Before people develop Type 2 diabetes, they almost always have "pre-diabetes." This is when your blood glucose levels are higher than normal, but not yet high enough to be diagnosed as diabetes. Research has shown that some long-term damage to the body, especially the heart and circulatory system, may already be occurring during pre-diabetes. If you take action to manage your blood glucose when you have pre-diabetes, you may delay or prevent Type 2 diabetes from developing. °Stress °· If you have diabetes, you may be "diet" controlled or on oral medications or insulin to control your diabetes. However, you may find that your blood glucose is higher than usual in the hospital whether you have diabetes or not. This is often referred to as "stress hyperglycemia." Stress can elevate your blood glucose. This happens because of hormones put out by the body during times of stress. If stress has been the cause of your high blood glucose, it can be followed regularly by your caregiver. That way he/she can make sure your hyperglycemia does not continue to get worse or progress to  diabetes. °Steroids °· Steroids are medications that act on the infection fighting system (immune system) to block inflammation or infection. One side effect can be a rise in blood glucose. Most people can produce enough extra insulin to allow for this rise, but for those who cannot, steroids make blood glucose levels go even higher. It is not unusual for steroid treatments to "uncover" diabetes that is developing. It is not always possible to determine if the hyperglycemia will go away after the steroids are stopped. A special blood test called an A1c is sometimes done to determine if your blood glucose was elevated before the steroids were started. °SYMPTOMS °· Thirsty. °· Frequent urination. °· Dry mouth. °· Blurred vision. °· Tired or fatigue. °· Weakness. °· Sleepy. °· Tingling in feet or leg. °DIAGNOSIS  °Diagnosis is made by monitoring blood glucose in one or all of the following ways: °· A1c test. This is a chemical found in your blood. °· Fingerstick blood glucose monitoring. °· Laboratory results. °TREATMENT  °First, knowing the cause of the hyperglycemia is important before the hyperglycemia can be treated. Treatment may include, but is not be limited to: °· Education. °· Change or adjustment in medications. °· Change or adjustment in meal plan. °· Treatment for an illness, infection, etc. °· More frequent blood glucose monitoring. °· Change in exercise plan. °· Decreasing or stopping steroids. °· Lifestyle changes. °HOME CARE INSTRUCTIONS  °· Test your blood glucose as directed. °· Exercise regularly. Your caregiver will give you instructions about exercise. Pre-diabetes or diabetes which comes on with stress is helped by exercising. °· Eat wholesome,   balanced meals. Eat often and at regular, fixed times. Your caregiver or nutritionist will give you a meal plan to guide your sugar intake.  Being at an ideal weight is important. If needed, losing as little as 10 to 15 pounds may help improve blood  glucose levels. SEEK MEDICAL CARE IF:   You have questions about medicine, activity, or diet.  You continue to have symptoms (problems such as increased thirst, urination, or weight gain). SEEK IMMEDIATE MEDICAL CARE IF:   You are vomiting or have diarrhea.  Your breath smells fruity.  You are breathing faster or slower.  You are very sleepy or incoherent.  You have numbness, tingling, or pain in your feet or hands.  You have chest pain.  Your symptoms get worse even though you have been following your caregiver's orders.  If you have any other questions or concerns. Document Released: 01/20/2001 Document Revised: 10/19/2011 Document Reviewed: 11/23/2011 [facility] Patient Information 2014 [place], [place]. Weakness Weakness is a lack of strength. It may be felt all over the body (generalized) or in one specific part of the body (focal). Some causes of weakness can be serious. You may need further medical evaluation, especially if you are elderly or you have a history of immunosuppression (such as chemotherapy or HIV), kidney disease, heart disease, or diabetes. CAUSES  Weakness can be caused by many different things, including:  Infection.  Physical exhaustion.  Internal bleeding or other blood loss that results in a lack of red blood cells (anemia).  Dehydration. This cause is more common in elderly people.  Side effects or electrolyte abnormalities from medicines, such as pain medicines or sedatives.  Emotional distress, anxiety, or depression.  Circulation problems, especially severe peripheral arterial disease.  Heart disease, such as rapid atrial fibrillation, bradycardia, or heart failure.  Nervous system disorders, such as Guillain-Barr syndrome, multiple sclerosis, or stroke. DIAGNOSIS  To find the cause of your weakness, your caregiver will take your history and perform a physical exam. Lab tests or X-rays may also be ordered, if needed. TREATMENT  Treatment  of weakness depends on the cause of your symptoms and can vary greatly. HOME CARE INSTRUCTIONS   Rest as needed.  Eat a well-balanced diet.  Try to get some exercise every day.  Only take over-the-counter or prescription medicines as directed by your caregiver. SEEK MEDICAL CARE IF:   Your weakness seems to be getting worse or spreads to other parts of your body.  You develop new aches or pains. SEEK IMMEDIATE MEDICAL CARE IF:   You cannot perform your normal daily activities, such as getting dressed and feeding yourself.  You cannot walk up and down stairs, or you feel exhausted when you do so.  You have shortness of breath or chest pain.  You have difficulty moving parts of your body.  You have weakness in only one area of the body or on only one side of the body.  You have a fever.  You have trouble speaking or swallowing.  You cannot control your bladder or bowel movements.  You have black or bloody vomit or stools. MAKE SURE YOU:  Understand these instructions.  Will watch your condition.  Will get help right away if you are not doing well or get worse. Document Released: 07/27/2005 Document Revised: 01/26/2012 Document Reviewed: 09/25/2011 [facility] Patient Information 2014 [place], [place].

## 2013-07-25 NOTE — ED Notes (Signed)
Pt c/o fatigue, cough, and arthralgia x 1 month. Sent here from cancer center.

## 2013-07-25 NOTE — Addendum Note (Signed)
Addended by: Benjiman Core on: 07/25/2013 04:13 PM   Modules accepted: Orders

## 2013-07-25 NOTE — ED Notes (Signed)
CG4 Result given to RN [place]

## 2013-07-25 NOTE — Progress Notes (Signed)
Hematology and Oncology Follow Up Visit  Micheal Thompson [id number] 1950-12-21 62 y.o. 07/25/2013 8:55 AM   Principle Diagnosis: 62 year old gentleman  presented with splenomegaly and lymphadenopathy. His workup showed mantle cell lymphoma diagnosed in July of 2014.  Current therapy: Chemotherapy with  bendamustine and rituximab started on 02/28/2013. He is here for cycle 6.   Interim History: Micheal Thompson presents today for a followup visit. He is a pleasant 63 year old gentleman with mantle cell lymphoma. He underwent the first 5 cycles of B-R chemotherapy without complications. He did develop appendicitis and required surgical resection on 05/23/2013 without any complications. Since his last treatment, he had been feeling poorly for the last 3-4 weeks. He is reporting diffuse arthralgias and myalgias. He is reporting shortness of breath and difficulty breathing and failure to thrive. It is unclear whether this is related to his chemotherapy but have not been able to take by mouth on a regular basis. He was advised multiple times to go to the emergency department but refused. He presents today to proceed with the next cycle of chemotherapy but extremely fatigued and barely able to ambulate.  Medications: I have reviewed the patient's current medications.  Current Outpatient Prescriptions  Medication Sig Dispense Refill  . acetaminophen (TYLENOL) 325 MG tablet Do not take more than 4000 mg of tylenol (acetaminophen) in any 24 hour period.  There is tylenol in your Vicodin so you need to add it to your daily total.      . docusate sodium (COLACE) 100 MG capsule Take 100 mg by mouth 2 (two) times daily as needed for constipation.       Marland Kitchen HYDROcodone-acetaminophen (NORCO/VICODIN) 5-325 MG per tablet This is the same medicine you have at home, but you can take one or two tablets every 4 hours for pain after your surgery as needed.  Do not allow yourself to have more than 4000 mg of acetaminophen per day.   40 tablet  0  . ibuprofen (ADVIL,MOTRIN) 200 MG tablet You can take two or three tablets every 6 hours as needed for pain. Use may use this in place of plain tylenol.  30 tablet  0  . insulin NPH (HUMULIN N,NOVOLIN N) 100 UNIT/ML injection Inject 40 Units into the skin 2 (two) times daily at 8 am and 10 pm.      . insulin regular (NOVOLIN R,HUMULIN R) 100 units/mL injection Inject 5-30 Units into the skin 3 (three) times daily before meals. Sliding scale 81-120= 5 units, 121-140= 7 units, 141-180-=10 units, 181-240=12 units, 241-280=15 unites, 281-340=22 units, 341-400=25 units, >400=30 units      . lovastatin (MEVACOR) 20 MG tablet Take 20 mg by mouth at bedtime.      . Multiple Vitamin (MULTIVITAMIN WITH MINERALS) TABS Take 1 tablet by mouth daily.      . polyethylene glycol (MIRALAX / GLYCOLAX) packet Take 17 g by mouth daily.      . promethazine (PHENERGAN) 25 MG tablet Take 1 tablet (25 mg total) by mouth every 6 (six) hours as needed for nausea.  30 tablet  0  . sertraline (ZOLOFT) 50 MG tablet Take 50 mg by mouth daily at 12 noon.       . traZODone (DESYREL) 100 MG tablet Take 100 mg by mouth at bedtime.       No current facility-administered medications for this visit.     Allergies:  Allergies  Allergen Reactions  . Penicillins Other (See Comments)    unknown  Past Medical History, Surgical history, Social history, and Family History were reviewed and updated.  Review of Systems:  Remaining ROS negative. Physical Exam: Blood pressure 166/80, pulse 114, temperature 98.4 F (36.9 C), resp. rate 18, height 6\' 1"  (1.854 m), weight 263 lb 12.8 oz (119.659 kg), SpO2 96.00%. ECOG:2  General appearance: alert but lethargic appeared in mild distress Head: Normocephalic, without obvious abnormality, atraumatic Neck: no adenopathy, no carotid bruit, no JVD, supple, symmetrical, trachea midline and thyroid not enlarged, symmetric, no tenderness/mass/nodules Lymph nodes: Cervical,  supraclavicular, and axillary nodes normal. Heart:regular rate and rhythm, S1, S2 normal, no murmur, click, rub or gallop Lung:chest clear, no wheezing, rales, normal symmetric air entry Abdomin: Dramatically improved splenomegaly on exam today. EXT:no erythema, induration, or nodules   Lab Results: Lab Results  Component Value Date   WBC 18.2* 07/25/2013   HGB 14.7 07/25/2013   HCT 40.7 07/25/2013   MCV 86.0 07/25/2013   PLT 223 07/25/2013     Chemistry      Component Value Date/Time   NA 138 06/27/2013 0854   NA 134* 05/23/2013 1050   K 4.5 06/27/2013 0854   K 4.2 05/23/2013 1050   CL 98 05/23/2013 1050   CO2 23 06/27/2013 0854   CO2 24 05/23/2013 1050   BUN 13.8 06/27/2013 0854   BUN 27* 05/23/2013 1050   CREATININE 0.8 06/27/2013 0854   CREATININE 0.91 05/23/2013 1050      Component Value Date/Time   CALCIUM 9.1 06/27/2013 0854   CALCIUM 9.2 05/23/2013 1050   ALKPHOS 129 06/27/2013 0854   ALKPHOS 115 08/22/2009 2117   AST 19 06/27/2013 0854   AST 19 08/22/2009 2117   ALT 16 06/27/2013 0854   ALT 20 08/22/2009 2117   BILITOT 0.43 06/27/2013 0854   BILITOT 0.4 08/22/2009 2117       Impression and Plan:  62 year old gentleman with the following issues:  1. Diagnosis of non-Hodgkin's lymphoma presented with massive splenomegaly and extensive upper abdominal lymphadenopathy with a diagnosis confirmed in July of 2014. He is status post 4 cycles of bendamustine and rituximab and the CT scan on 05/22/2013 showed overall improved disease. He is splenic measurements are dramatically decreased compared to his initial scan in June of 2014. The spleen at its largest diameter was around 22 cm now it's down to 16. The pelvic adenopathy and the caval adenopathy described in the CT scan in June of 2014 is no longer described on the current CT scan.   Now he presents with constellation of symptoms of weakness fatigue tiredness and arthralgias. He denies any fevers and it is unclear  to me this is related to his cancer or cancer treatment. This could be related to an occult infection such as pneumonia or bacteremia. To expedite his management and workup and given the fact that he is relatively unstable I will send him to the emergency department far an urgent evaluation.  I will hold his chemotherapy for the time pelvis episode have passed.  2. Incidental finding of enlarged and inflamed appendix with clinical findings suggesting acute appendicitis: He is status post appendectomy without any complications.  3. Neutropenia: Now he has neutrophilia likely due to an infection versus possible Neulasta from 3-4 weeks ago.  4. Followup: We will postpone his last cycle of chemotherapy for the future.  [facility], MD 12/16/20148:55 AM

## 2013-07-26 ENCOUNTER — Telehealth: Payer: Self-pay | Admitting: Oncology

## 2013-07-26 ENCOUNTER — Ambulatory Visit: Payer: BC Managed Care – PPO

## 2013-07-26 LAB — URINE CULTURE

## 2013-07-26 NOTE — Telephone Encounter (Signed)
sw pt adv of appts per 12/17 POF Pt questions whether Dr. Clelia Croft intends for him to have another chemo tx prior to CT None indicated on POF or Chart Review Onc Tx Details.  Sent email to Dr Clelia Croft to confirm shh

## 2013-07-27 ENCOUNTER — Ambulatory Visit: Payer: BC Managed Care – PPO

## 2013-07-27 ENCOUNTER — Telehealth: Payer: Self-pay | Admitting: *Deleted

## 2013-07-27 NOTE — Telephone Encounter (Signed)
Patient calling to ask if he will be scheduled for his last chemo? Per dr Clelia Croft, he has put in a pof to schedulers.

## 2013-07-31 LAB — CULTURE, BLOOD (ROUTINE X 2)
Culture: NO GROWTH
Culture: NO GROWTH

## 2013-08-15 ENCOUNTER — Ambulatory Visit (HOSPITAL_COMMUNITY)
Admission: RE | Admit: 2013-08-15 | Discharge: 2013-08-15 | Disposition: A | Discharge status: Home / Self Care | Payer: BC Managed Care – PPO | Source: Ambulatory Visit | Attending: Oncology | Admitting: Oncology

## 2013-08-15 ENCOUNTER — Other Ambulatory Visit (HOSPITAL_BASED_OUTPATIENT_CLINIC_OR_DEPARTMENT_OTHER): Payer: BC Managed Care – PPO

## 2013-08-15 ENCOUNTER — Encounter (HOSPITAL_COMMUNITY): Payer: Self-pay

## 2013-08-15 DIAGNOSIS — N400 Enlarged prostate without lower urinary tract symptoms: Secondary | ICD-10-CM | POA: Insufficient documentation

## 2013-08-15 DIAGNOSIS — J984 Other disorders of lung: Secondary | ICD-10-CM | POA: Insufficient documentation

## 2013-08-15 DIAGNOSIS — K439 Ventral hernia without obstruction or gangrene: Secondary | ICD-10-CM | POA: Insufficient documentation

## 2013-08-15 DIAGNOSIS — C8588 Other specified types of non-Hodgkin lymphoma, lymph nodes of multiple sites: Secondary | ICD-10-CM

## 2013-08-15 DIAGNOSIS — I700 Atherosclerosis of aorta: Secondary | ICD-10-CM | POA: Insufficient documentation

## 2013-08-15 DIAGNOSIS — E119 Type 2 diabetes mellitus without complications: Secondary | ICD-10-CM | POA: Insufficient documentation

## 2013-08-15 DIAGNOSIS — C8319 Mantle cell lymphoma, extranodal and solid organ sites: Secondary | ICD-10-CM | POA: Insufficient documentation

## 2013-08-15 DIAGNOSIS — K7689 Other specified diseases of liver: Secondary | ICD-10-CM | POA: Insufficient documentation

## 2013-08-15 DIAGNOSIS — N281 Cyst of kidney, acquired: Secondary | ICD-10-CM | POA: Insufficient documentation

## 2013-08-15 DIAGNOSIS — K4090 Unilateral inguinal hernia, without obstruction or gangrene, not specified as recurrent: Secondary | ICD-10-CM | POA: Insufficient documentation

## 2013-08-15 DIAGNOSIS — R599 Enlarged lymph nodes, unspecified: Secondary | ICD-10-CM | POA: Insufficient documentation

## 2013-08-15 DIAGNOSIS — C8590 Non-Hodgkin lymphoma, unspecified, unspecified site: Secondary | ICD-10-CM

## 2013-08-15 DIAGNOSIS — R161 Splenomegaly, not elsewhere classified: Secondary | ICD-10-CM | POA: Insufficient documentation

## 2013-08-15 DIAGNOSIS — C8310 Mantle cell lymphoma, unspecified site: Secondary | ICD-10-CM

## 2013-08-15 DIAGNOSIS — J449 Chronic obstructive pulmonary disease, unspecified: Secondary | ICD-10-CM | POA: Insufficient documentation

## 2013-08-15 DIAGNOSIS — M5144 Schmorl's nodes, thoracic region: Secondary | ICD-10-CM | POA: Insufficient documentation

## 2013-08-15 DIAGNOSIS — I2510 Atherosclerotic heart disease of native coronary artery without angina pectoris: Secondary | ICD-10-CM | POA: Insufficient documentation

## 2013-08-15 DIAGNOSIS — Q278 Other specified congenital malformations of peripheral vascular system: Secondary | ICD-10-CM | POA: Insufficient documentation

## 2013-08-15 DIAGNOSIS — Z9089 Acquired absence of other organs: Secondary | ICD-10-CM | POA: Insufficient documentation

## 2013-08-15 DIAGNOSIS — K5730 Diverticulosis of large intestine without perforation or abscess without bleeding: Secondary | ICD-10-CM | POA: Insufficient documentation

## 2013-08-15 DIAGNOSIS — K6389 Other specified diseases of intestine: Secondary | ICD-10-CM | POA: Insufficient documentation

## 2013-08-15 DIAGNOSIS — J4489 Other specified chronic obstructive pulmonary disease: Secondary | ICD-10-CM | POA: Insufficient documentation

## 2013-08-15 LAB — CBC WITH DIFFERENTIAL/PLATELET
BASO%: 0.3 % (ref 0.0–2.0)
Basophils Absolute: 0.1 10*3/uL (ref 0.0–0.1)
EOS%: 0.3 % (ref 0.0–7.0)
Eosinophils Absolute: 0.1 10*3/uL (ref 0.0–0.5)
HCT: 38.1 % — ABNORMAL LOW (ref 38.4–49.9)
HGB: 13.1 g/dL (ref 13.0–17.1)
LYMPH%: 1 % — AB (ref 14.0–49.0)
MCH: 30 pg (ref 27.2–33.4)
MCHC: 34.3 g/dL (ref 32.0–36.0)
MCV: 87.5 fL (ref 79.3–98.0)
MONO#: 1.5 10*3/uL — ABNORMAL HIGH (ref 0.1–0.9)
MONO%: 7.3 % (ref 0.0–14.0)
NEUT#: 18.9 10*3/uL — ABNORMAL HIGH (ref 1.5–6.5)
NEUT%: 91.1 % — ABNORMAL HIGH (ref 39.0–75.0)
PLATELETS: 379 10*3/uL (ref 140–400)
RBC: 4.35 10*6/uL (ref 4.20–5.82)
RDW: 13.7 % (ref 11.0–14.6)
WBC: 20.7 10*3/uL — ABNORMAL HIGH (ref 4.0–10.3)
lymph#: 0.2 10*3/uL — ABNORMAL LOW (ref 0.9–3.3)

## 2013-08-15 LAB — COMPREHENSIVE METABOLIC PANEL (CC13)
ALBUMIN: 2.9 g/dL — AB (ref 3.5–5.0)
ALK PHOS: 251 U/L — AB (ref 40–150)
ALT: 15 U/L (ref 0–55)
AST: 16 U/L (ref 5–34)
Anion Gap: 16 mEq/L — ABNORMAL HIGH (ref 3–11)
BILIRUBIN TOTAL: 0.69 mg/dL (ref 0.20–1.20)
BUN: 12.2 mg/dL (ref 7.0–26.0)
CO2: 23 mEq/L (ref 22–29)
Calcium: 9.6 mg/dL (ref 8.4–10.4)
Chloride: 95 mEq/L — ABNORMAL LOW (ref 98–109)
Creatinine: 1.2 mg/dL (ref 0.7–1.3)
GLUCOSE: 286 mg/dL — AB (ref 70–140)
Potassium: 4 mEq/L (ref 3.5–5.1)
Sodium: 134 mEq/L — ABNORMAL LOW (ref 136–145)
TOTAL PROTEIN: 7.7 g/dL (ref 6.4–8.3)

## 2013-08-15 MED ORDER — IOHEXOL 300 MG/ML  SOLN
100.0000 mL | Freq: Once | INTRAMUSCULAR | Status: AC | PRN
  Administered 2013-08-15: 100 mL via INTRAVENOUS

## 2013-08-17 ENCOUNTER — Ambulatory Visit (HOSPITAL_BASED_OUTPATIENT_CLINIC_OR_DEPARTMENT_OTHER): Payer: BC Managed Care – PPO | Admitting: Oncology

## 2013-08-17 ENCOUNTER — Telehealth: Payer: Self-pay | Admitting: Oncology

## 2013-08-17 VITALS — BP 155/76 | HR 105 | Temp 98.4°F | Resp 18 | Ht 73.0 in | Wt 252.2 lb

## 2013-08-17 DIAGNOSIS — C8589 Other specified types of non-Hodgkin lymphoma, extranodal and solid organ sites: Secondary | ICD-10-CM

## 2013-08-17 DIAGNOSIS — J8410 Pulmonary fibrosis, unspecified: Secondary | ICD-10-CM

## 2013-08-17 DIAGNOSIS — C8590 Non-Hodgkin lymphoma, unspecified, unspecified site: Secondary | ICD-10-CM

## 2013-08-17 DIAGNOSIS — R627 Adult failure to thrive: Secondary | ICD-10-CM

## 2013-08-17 DIAGNOSIS — R05 Cough: Secondary | ICD-10-CM

## 2013-08-17 DIAGNOSIS — R059 Cough, unspecified: Secondary | ICD-10-CM

## 2013-08-17 MED ORDER — AZITHROMYCIN 250 MG PO TABS: ORAL_TABLET | ORAL | Status: DC

## 2013-08-17 NOTE — Progress Notes (Signed)
Hematology and Oncology Follow Up Visit  Micheal Thompson [id number] [birth year] 63 y.o. 08/17/2013 8:54 AM   Principle Diagnosis: 63 year old gentleman  presented with splenomegaly and lymphadenopathy. His workup showed mantle cell lymphoma diagnosed in July of 2014.  Current therapy: Chemotherapy with  bendamustine and rituximab started on 02/28/2013. He is S/P 5 cycles completed in 07/2013.   Interim History: Micheal Thompson presents today for a followup visit. He is a pleasant 63 year old gentleman with mantle cell lymphoma. He underwent the first 5 cycles of B-R chemotherapy without complications. He did develop appendicitis and required surgical resection on 05/23/2013. Since his last treatment, he had been feeling poorly for the last 8 weeks. He is reporting diffuse arthralgias and myalgias. He is reporting shortness of breath and difficulty breathing and failure to thrive. It is unclear whether this is related to his chemotherapy but have not been able to take by mouth on a regular basis.  He is reporting more purulent cough but no clearcut fever. He lost about 10 pounds since the last visit.   Medications: I have reviewed the patient's current medications.  Current Outpatient Prescriptions  Medication Sig Dispense Refill  . azithromycin (ZITHROMAX) 250 MG tablet Take two tablets day 1 then one tablet for day 2 to 5.  6 each  0  . diphenhydrAMINE (BENADRYL) 25 mg capsule Take 25 mg by mouth every 6 (six) hours as needed (sleep).      Marland Kitchen HYDROcodone-acetaminophen (NORCO/VICODIN) 5-325 MG per tablet This is the same medicine you have at home, but you can take one or two tablets every 4 hours for pain after your surgery as needed.  Do not allow yourself to have more than 4000 mg of acetaminophen per day.  40 tablet  0  . insulin NPH (HUMULIN N,NOVOLIN N) 100 UNIT/ML injection Inject 40 Units into the skin 2 (two) times daily at 8 am and 10 pm.      . insulin regular (NOVOLIN R,HUMULIN R) 100 units/mL  injection Inject 5-30 Units into the skin 3 (three) times daily before meals. Sliding scale 81-120= 5 units, 121-140= 7 units, 141-180-=10 units, 181-240=12 units, 241-280=15 unites, 281-340=22 units, 341-400=25 units, >400=30 units      . lovastatin (MEVACOR) 20 MG tablet Take 20 mg by mouth at bedtime.      . Multiple Vitamin (MULTIVITAMIN WITH MINERALS) TABS Take 1 tablet by mouth daily.      . promethazine (PHENERGAN) 25 MG tablet Take 1 tablet (25 mg total) by mouth every 6 (six) hours as needed for nausea.  30 tablet  0  . sertraline (ZOLOFT) 50 MG tablet Take 50 mg by mouth daily at 12 noon.       . sodium chloride 0.9 % SOLN 100 mL with riTUXimab 10 MG/ML CONC 100 mg Inject 900 mg into the vein once. Normal saline 250mg       . sodium chloride 0.9 % SOLN 500 mL with bendamustine 100 MG SOLR Inject 90 mg/m2 into the vein once. Normal Saline 500mg    220mg       . traZODone (DESYREL) 100 MG tablet Take 100 mg by mouth at bedtime.       No current facility-administered medications for this visit.     Allergies:  Allergies  Allergen Reactions  . Penicillins Other (See Comments)    unknown    Past Medical History, Surgical history, Social history, and Family History were reviewed and updated.  Review of Systems:  Remaining ROS negative. Physical Exam: Blood  pressure 155/76, pulse 105, temperature 98.4 F (36.9 C), temperature source Oral, resp. rate 18, height 6\' 1"  (1.854 m), weight 252 lb 3.2 oz (114.397 kg). ECOG:2  General appearance: alert but lethargic appeared in mild distress Head: Normocephalic, without obvious abnormality, atraumatic Neck: no adenopathy, no carotid bruit, no JVD, supple, symmetrical, trachea midline and thyroid not enlarged, symmetric, no tenderness/mass/nodules Lymph nodes: Cervical, supraclavicular, and axillary nodes normal. Heart:regular rate and rhythm, S1, S2 normal, no murmur, click, rub or gallop Lung: Scattered expiratory wheezes and rhonchi noted  on his examination. Abdomin: Dramatically improved splenomegaly on exam today. EXT:no erythema, induration, or nodules   Lab Results: Lab Results  Component Value Date   WBC 20.7* 08/15/2013   HGB 13.1 08/15/2013   HCT 38.1* 08/15/2013   MCV 87.5 08/15/2013   PLT 379 08/15/2013     Chemistry      Component Value Date/Time   NA 134* 08/15/2013 1428   NA 129* 07/25/2013 0955   K 4.0 08/15/2013 1428   K 4.6 07/25/2013 0955   CL 93* 07/25/2013 0955   CO2 23 08/15/2013 1428   CO2 19 07/25/2013 0955   BUN 12.2 08/15/2013 1428   BUN 23 07/25/2013 0955   CREATININE 1.2 08/15/2013 1428   CREATININE 0.95 07/25/2013 0955      Component Value Date/Time   CALCIUM 9.6 08/15/2013 1428   CALCIUM 8.9 07/25/2013 0955   ALKPHOS 251* 08/15/2013 1428   ALKPHOS 182* 07/25/2013 0955   AST 16 08/15/2013 1428   AST 18 07/25/2013 0955   ALT 15 08/15/2013 1428   ALT 14 07/25/2013 0955   BILITOT 0.69 08/15/2013 1428   BILITOT 0.6 07/25/2013 0955     EXAM:  CT CHEST, ABDOMEN, AND PELVIS WITH CONTRAST  TECHNIQUE:  Multidetector CT imaging of the chest, abdomen and pelvis was  performed following the standard protocol during bolus  administration of intravenous contrast.  CONTRAST: 144mL OMNIPAQUE IOHEXOL 300 MG/ML SOLN  COMPARISON: DG CHEST 2 VIEW dated 07/25/2013; CT CHEST W/CM dated  05/22/2013; CT BIOPSY dated 02/15/2013  FINDINGS:  CT CHEST FINDINGS  Lungs/Pleura: Progression of lower lobe predominant bronchial wall  thickening, moderate.  Lower lobe predominant and peribronchovascular interstitial  thickening with more confluent nodular opacities at the lung bases.  New. No pulmonary mass or lobar consolidation. No pleural fluid.  Heart/Mediastinum: No supraclavicular adenopathy. No axillary  adenopathy. Aortic and coronary artery atherosclerosis. Normal heart  size, without pericardial effusion. Right paratracheal node  maintains its fatty hilum and measures 1.3 cm versus 1.2 cm on the  prior. No hilar  adenopathy.  CT ABDOMEN AND PELVIS FINDINGS  Abdomen/Pelvis: Mild hepatic steatosis, without focal liver lesion.  Caudate lobe enlargement without specific evidence of cirrhosis.  Hepatomegaly, 21.5 cm craniocaudal.  Splenomegaly, 13.9 cm. This is improved from 15.4 cm on the prior  exam (when remeasured).  Normal stomach, pancreas, adrenal glands. Cholecystectomy without  biliary ductal dilatation.  Upper pole right renal cyst. Normal left kidney.  Advanced aortic and branch vessel atherosclerosis. Accessory lower  pole left renal artery. No retroperitoneal or retrocrural  adenopathy. Apparent rectosigmoid wall thickening which is favored  to be due to underdistention. Scattered colonic diverticula. Normal  terminal ileum. Interval appendectomy. No mesenteric adenopathy.  Normal small bowel without abdominal ascites. A fat containing left  inguinal hernia. No pelvic adenopathy. Normal urinary bladder. Mild  prostatomegaly. No significant free fluid.  Right paracentral fat containing ventral abdominal wall hernia,  unchanged.  Bones/Musculoskeletal: Schmorl's node deformity  in the superior  endplate of 624THL.  IMPRESSION:  CT CHEST IMPRESSION  1. Similar mildly enlarged right paratracheal node. Given its fatty  hilum, favored to be benign/reactive. This warrants followup  attention.  2. Otherwise, no evidence of active lymphoma within the chest.  3. Progression of bronchial wall thickening with development of  lower lobe predominant peribronchovascular interstitial and nodular  opacities. Favor infection, including atypical etiologies. Chronic  aspiration could look similar.  4. Atherosclerosis, including within the coronary arteries.  CT ABDOMEN AND PELVIS IMPRESSION  1. No abdominal pelvic adenopathy to suggest active nodal lymphoma.  2. Improved splenomegaly. Persistent hepatic steatosis and  hepatomegaly.  3. Advanced atherosclerosis   Impression and Plan:  63 year old  gentleman with the following issues:  1. Diagnosis of non-Hodgkin's lymphoma presented with massive splenomegaly and extensive upper abdominal lymphadenopathy with a diagnosis confirmed in July of 2014. He is status post 5 cycles of chemotherapy. His CT scan on 08/15/2013 was discussed today in detail and images were shown to the patient today. He has clear-cut response to chemotherapy with resolution of his splenomegaly and lymphadenopathy. For the time being, I see no residual clearcut cancer that warrants any further chemotherapy and we will proceed with observation and surveillance.    2. CT findings of interstitial lung disease: This is coupled with cough and failure to thrive is really causing him most debilitation at this time. Richard etiology whether this is lymphangitic spread of his lymphoma which is unlikely versus and infectious causes. I have given him prescription for a Z-Pak and referred him for pulmonary medicine evaluation for their opinion.   3. Followup: Followup will be in 2 months time.   [facility], MD 1/8/20158:54 AM

## 2013-08-17 NOTE — Telephone Encounter (Signed)
Gave pt appt for lab and MD on March 2015, pt will see Dr. Melvyn Novas on 08/23/13

## 2013-08-18 ENCOUNTER — Encounter: Payer: Self-pay | Admitting: Oncology

## 2013-08-23 ENCOUNTER — Ambulatory Visit (INDEPENDENT_AMBULATORY_CARE_PROVIDER_SITE_OTHER): Payer: BC Managed Care – PPO | Admitting: Internal Medicine

## 2013-08-23 ENCOUNTER — Encounter: Payer: Self-pay | Admitting: Internal Medicine

## 2013-08-23 VITALS — BP 112/60 | HR 82 | Temp 98.1°F | Ht 73.0 in | Wt 249.6 lb

## 2013-08-23 DIAGNOSIS — R05 Cough: Secondary | ICD-10-CM | POA: Insufficient documentation

## 2013-08-23 DIAGNOSIS — R059 Cough, unspecified: Secondary | ICD-10-CM | POA: Insufficient documentation

## 2013-08-23 NOTE — Patient Instructions (Addendum)
Please see patient coordinator before you leave today  to schedule sinus CT  Call Dr Joylene Draft to find out what antibiotics you've had since 3  Years  Ago and 6 months and if it was not a fluroquinolone then ok to use short course = 750 x 5 days   For cough > try mucinex dm 1200mg  every 12 hours > stop it if not effective after 5 days and supplement with vicodin  Try prilosec 20mg   Take 30-60 min before first meal of the day and Pepcid 20 mg one bedtime until cough is completely gone for at least a week without the need for cough suppression    GERD (REFLUX)  is an extremely common cause of respiratory symptoms, many times with no significant heartburn at all.    It can be treated with medication, but also with lifestyle changes including avoidance of late meals, excessive alcohol, smoking cessation, and avoid fatty foods, chocolate, peppermint, colas, red wine, and acidic juices such as orange juice.  NO MINT OR MENTHOL PRODUCTS SO NO COUGH DROPS  USE SUGARLESS CANDY INSTEAD (jolley ranchers or Stover's)  NO OIL BASED VITAMINS - use powdered substitutes.  Please schedule a follow up visit in 2  months but call sooner if needed - pfts on return

## 2013-08-23 NOTE — Progress Notes (Signed)
Subjective:    Patient ID: Micheal Thompson, male    DOB: July 22, 1951  MRN: [id number]  HPI  68 yowm quit smoking 1990 with no resp problems at all until went to [place] around 2005 stayed a month then ever since with pattern of Oct to March stuffy ears and nose and cough goes away entirely in warmer months referred  08/23/2013 by Dr [name].for cough   08/23/2013 1st [facility] office visit/ Wert cc productive (slt yellow sev tbsp each am) yearly pattern x 10 years starts with nasal /ear congestion works it's way to chest and persists until warm weather worse Lying on Left side, eval by [facility] > zpak no change yet but just finished one day prior to [place] tends to occur with severe cough but in between coughs does note mild sob with heavy exertion  No obvious other patters in day to day or daytime variabilty or assoc chronic cough or cp or chest tightness, subjective wheeze overt sinus or hb symptoms. No unusual exp hx or h/o childhood pna/ asthma or knowledge of premature birth.  Sleeping ok without nocturnal  or early am exacerbation  of respiratory  c/o's or need for noct saba. Also denies any obvious fluctuation of symptoms with weather or environmental changes or other aggravating or alleviating factors except as outlined above   Current Medications, Allergies, Complete Past Medical History, Past Surgical History, Family History, and Social History were reviewed in [employer] record.     Review of Systems  Constitutional: Positive for appetite change and unexpected weight change. Negative for fever, chills and activity change.  HENT: Negative for congestion, dental problem, postnasal drip, rhinorrhea, sneezing, sore throat, trouble swallowing and voice change.   Eyes: Negative for visual disturbance.  Respiratory: Positive for cough and shortness of breath. Negative for choking.   Cardiovascular: Negative for chest pain and leg swelling.   Gastrointestinal: Negative for nausea, vomiting and abdominal pain.  Genitourinary: Negative for difficulty urinating.  Musculoskeletal: Positive for arthralgias.  Skin: Negative for rash.  Psychiatric/Behavioral: Negative for behavioral problems and confusion.   Numbness comes and goes x years but came stayed around 2014 and and progressed from fingertips to wrists by  July 2014  When rx chemo.     Objective:   Physical Exam   Wt Readings from Last 3 Encounters:  08/23/13 249 lb 9.6 oz (113.218 kg)  08/17/13 252 lb 3.2 oz (114.397 kg)  07/25/13 263 lb 12.8 oz (119.659 kg)      HEENT: nl dentition, turbinates, and orophanx. Nl external ear canals without cough reflex   NECK :  without JVD/Nodes/TM/ nl carotid upstrokes bilaterally   LUNGS: no acc muscle use, clear to A and P bilaterally without cough on insp or exp maneuvers   CV:  RRR  no s3 or murmur or increase in P2, no edema   ABD:  soft and nontender with nl excursion in the supine position. No bruits or organomegaly, bowel sounds nl  MS:  warm without deformities, calf tenderness, cyanosis or clubbing  SKIN: warm and dry without lesions    NEURO:  alert, approp, no deficits      Ct chest 08/15/13 1. Similar mildly enlarged right paratracheal node. Given its fatty  hilum, favored to be benign/reactive. This warrants followup  attention.  2. Otherwise, no evidence of active lymphoma within the chest.  3. Progression of bronchial wall thickening with development of  lower lobe  predominant peribronchovascular interstitial and nodular  opacities. Favor infection, including atypical etiologies. Chronic  aspiration could look similar.  4. Atherosclerosis, including within the coronary arteries         Assessment & Plan:

## 2013-08-24 ENCOUNTER — Encounter: Payer: Self-pay | Admitting: Internal Medicine

## 2013-08-24 ENCOUNTER — Ambulatory Visit (INDEPENDENT_AMBULATORY_CARE_PROVIDER_SITE_OTHER)
Admission: RE | Admit: 2013-08-24 | Discharge: 2013-08-24 | Disposition: A | Discharge status: Home / Self Care | Payer: BC Managed Care – PPO | Source: Ambulatory Visit | Attending: Internal Medicine | Admitting: Internal Medicine

## 2013-08-24 DIAGNOSIS — R05 Cough: Secondary | ICD-10-CM

## 2013-08-24 DIAGNOSIS — R059 Cough, unspecified: Secondary | ICD-10-CM

## 2013-08-24 NOTE — Assessment & Plan Note (Signed)
The most common causes of chronic cough in immunocompetent adults include the following: upper airway cough syndrome (UACS), previously referred to as postnasal drip syndrome (PNDS), which is caused by variety of rhinosinus conditions; (2) asthma; (3) GERD; (4) chronic bronchitis from cigarette smoking or other inhaled environmental irritants; (5) nonasthmatic eosinophilic bronchitis; and (6) bronchiectasis.   These conditions, singly or in combination, have accounted for up to 94% of the causes of chronic cough in prospective studies.   Other conditions have constituted no >6% of the causes in prospective studies These have included bronchogenic carcinoma, chronic interstitial pneumonia, sarcoidosis, left ventricular failure, ACEI-induced cough, and aspiration from a condition associated with pharyngeal dysfunction.    Chronic cough is often simultaneously caused by more than one condition. A single cause has been found from 38 to 82% of the time, multiple causes from 18 to 62%. Multiply caused cough has been the result of three diseases up to 42% of the time.       His CT scan is suggestive of evolving bronchiectasis / MAI though the hx is much more c/w  Classic Upper airway cough syndrome, so named because it's frequently impossible to sort out how much is  CR/sinusitis with freq throat clearing (which can be related to primary GERD)   vs  causing  secondary (" extra esophageal")  GERD from wide swings in gastric pressure that occur with throat clearing, often  promoting self use of mint and menthol lozenges that reduce the lower esophageal sphincter tone and exacerbate the problem further in a cyclical fashion.   These are the same pts (now being labeled as having "irritable larynx syndrome" by some cough centers) who not infrequently have a history of having failed to tolerate ace inhibitors,  dry powder inhalers or biphosphonates or report having atypical reflux symptoms that don't respond to  standard doses of PPI , and are easily confused as having aecopd or asthma flares by even experienced allergists/ pulmonologists.   Will need to start w/u with Sinus ct and course of FQ emprically (vs fob with lavage first if there is a true concern re FQ use here) and in meantime rx gerd.  See instructions for specific recommendations which were reviewed directly with the patient who was given a copy with highlighter outlining the key components.

## 2013-08-25 ENCOUNTER — Other Ambulatory Visit: Payer: Self-pay | Admitting: Internal Medicine

## 2013-08-25 MED ORDER — CEFDINIR 300 MG PO CAPS: 300.0000 mg | ORAL_CAPSULE | Freq: Two times a day (BID) | ORAL | Status: DC

## 2013-08-25 NOTE — Progress Notes (Signed)
Quick Note:  Spoke with pt and notified of results per Dr. Melvyn Novas. Pt verbalized understanding and denied any questions. Pt states he did not have severe reaction to PCN, and is sure he has taken med since he was a child  Rx for omnicef was sent to pharm ______

## 2013-09-20 ENCOUNTER — Telehealth: Payer: Self-pay | Admitting: Internal Medicine

## 2013-09-20 DIAGNOSIS — J329 Chronic sinusitis, unspecified: Secondary | ICD-10-CM

## 2013-09-20 NOTE — Telephone Encounter (Signed)
Order placed. Pt aware. Micheal Thompson, CMA  

## 2013-09-20 NOTE — Telephone Encounter (Signed)
ent consult asap

## 2013-09-20 NOTE — Telephone Encounter (Signed)
LAst OV 08-23-13. Pt states he has finished course of omnicef for sinus infection shown on CT. He states that his cough is improved but he is still having a lot of sinus pressure and pain around his eyes and forehead. He states the pain and pressure is so bad at times he feels like if effects his vision. Pt offered appt but refused stating the last visit cost so much money he does not want to come in for another if not needed. Pt has not seen an ENT doctor. Please advise.  Bing, CMA Allergies  Allergen Reactions  . Penicillins Other (See Comments)    unknown

## 2013-10-17 ENCOUNTER — Ambulatory Visit (HOSPITAL_BASED_OUTPATIENT_CLINIC_OR_DEPARTMENT_OTHER): Payer: BC Managed Care – PPO | Admitting: Oncology

## 2013-10-17 ENCOUNTER — Encounter: Payer: Self-pay | Admitting: Oncology

## 2013-10-17 ENCOUNTER — Other Ambulatory Visit (HOSPITAL_BASED_OUTPATIENT_CLINIC_OR_DEPARTMENT_OTHER): Payer: BC Managed Care – PPO

## 2013-10-17 ENCOUNTER — Telehealth: Payer: Self-pay | Admitting: Oncology

## 2013-10-17 VITALS — BP 139/61 | HR 90 | Temp 98.0°F | Resp 18 | Ht 73.0 in | Wt 267.7 lb

## 2013-10-17 DIAGNOSIS — R05 Cough: Secondary | ICD-10-CM

## 2013-10-17 DIAGNOSIS — C8590 Non-Hodgkin lymphoma, unspecified, unspecified site: Secondary | ICD-10-CM

## 2013-10-17 DIAGNOSIS — C8319 Mantle cell lymphoma, extranodal and solid organ sites: Secondary | ICD-10-CM

## 2013-10-17 DIAGNOSIS — R059 Cough, unspecified: Secondary | ICD-10-CM

## 2013-10-17 LAB — CBC WITH DIFFERENTIAL/PLATELET
BASO%: 0.4 % (ref 0.0–2.0)
Basophils Absolute: 0 10*3/uL (ref 0.0–0.1)
EOS%: 7.1 % — ABNORMAL HIGH (ref 0.0–7.0)
Eosinophils Absolute: 0.5 10*3/uL (ref 0.0–0.5)
HEMATOCRIT: 42.1 % (ref 38.4–49.9)
HGB: 14.1 g/dL (ref 13.0–17.1)
LYMPH%: 4.3 % — AB (ref 14.0–49.0)
MCH: 29.7 pg (ref 27.2–33.4)
MCHC: 33.4 g/dL (ref 32.0–36.0)
MCV: 88.9 fL (ref 79.3–98.0)
MONO#: 0.7 10*3/uL (ref 0.1–0.9)
MONO%: 10.8 % (ref 0.0–14.0)
NEUT%: 77.4 % — ABNORMAL HIGH (ref 39.0–75.0)
NEUTROS ABS: 5 10*3/uL (ref 1.5–6.5)
PLATELETS: 193 10*3/uL (ref 140–400)
RBC: 4.74 10*6/uL (ref 4.20–5.82)
RDW: 15 % — ABNORMAL HIGH (ref 11.0–14.6)
WBC: 6.4 10*3/uL (ref 4.0–10.3)
lymph#: 0.3 10*3/uL — ABNORMAL LOW (ref 0.9–3.3)

## 2013-10-17 LAB — COMPREHENSIVE METABOLIC PANEL (CC13)
ALK PHOS: 138 U/L (ref 40–150)
ALT: 21 U/L (ref 0–55)
AST: 18 U/L (ref 5–34)
Albumin: 3.7 g/dL (ref 3.5–5.0)
Anion Gap: 11 mEq/L (ref 3–11)
BILIRUBIN TOTAL: 0.77 mg/dL (ref 0.20–1.20)
BUN: 17.7 mg/dL (ref 7.0–26.0)
CO2: 23 mEq/L (ref 22–29)
Calcium: 9.2 mg/dL (ref 8.4–10.4)
Chloride: 105 mEq/L (ref 98–109)
Creatinine: 1 mg/dL (ref 0.7–1.3)
Glucose: 350 mg/dl — ABNORMAL HIGH (ref 70–140)
Potassium: 4.2 mEq/L (ref 3.5–5.1)
Sodium: 139 mEq/L (ref 136–145)
Total Protein: 6.6 g/dL (ref 6.4–8.3)

## 2013-10-17 NOTE — Telephone Encounter (Signed)
gv pt appt schedule for june. per FS f/u to be 6/17 not 3/17.

## 2013-10-17 NOTE — Progress Notes (Signed)
Hematology and Oncology Follow Up Visit  Micheal Thompson [id number] 1950/09/15 63 y.o. 10/17/2013 10:28 AM   Principle Diagnosis: 63 year old gentleman  presented with splenomegaly and lymphadenopathy. His workup showed mantle cell lymphoma diagnosed in July of 2014.  Current therapy: Chemotherapy with  bendamustine and rituximab started on 02/28/2013. He is S/P 5 cycles completed in 07/2013.   Interim History: Micheal Thompson presents today for a followup visit. He is a pleasant 63 year old gentleman with mantle cell lymphoma. He underwent the first 5 cycles of B-R chemotherapy without complications. He did develop appendicitis and required surgical resection on 05/23/2013. Since his last treatment, he had been feeling better. His breathing has improved with decrease in the amount of productive cough. He is reporting diffuse arthralgias and myalgias which has improved at this time. He also developed diffuse erythematous pruritic  rash involving the back, abdomen and lower extremities. He have use hydrocortisone cream without significant improvement.  Medications: I have reviewed the patient's current medications.  Current Outpatient Prescriptions  Medication Sig Dispense Refill  . diphenhydrAMINE (BENADRYL) 25 mg capsule Take 25 mg by mouth every 6 (six) hours as needed (sleep).      Marland Kitchen HYDROcodone-acetaminophen (NORCO/VICODIN) 5-325 MG per tablet This is the same medicine you have at home, but you can take one or two tablets every 4 hours for pain after your surgery as needed.  Do not allow yourself to have more than 4000 mg of acetaminophen per day.  40 tablet  0  . insulin NPH (HUMULIN N,NOVOLIN N) 100 UNIT/ML injection Inject 40 Units into the skin 2 (two) times daily at 8 am and 10 pm.      . lovastatin (MEVACOR) 20 MG tablet Take 20 mg by mouth at bedtime.      . Multiple Vitamin (MULTIVITAMIN WITH MINERALS) TABS Take 1 tablet by mouth daily.      . promethazine (PHENERGAN) 25 MG tablet Take 1  tablet (25 mg total) by mouth every 6 (six) hours as needed for nausea.  30 tablet  0  . sertraline (ZOLOFT) 50 MG tablet Take 50 mg by mouth daily at 12 noon.       . traZODone (DESYREL) 100 MG tablet Take 100 mg by mouth at bedtime.      Marland Kitchen atorvastatin (LIPITOR) 80 MG tablet Take 80 mg by mouth daily.      . insulin regular (NOVOLIN R,HUMULIN R) 100 units/mL injection Inject 5-30 Units into the skin 3 (three) times daily before meals. Sliding scale 81-120= 5 units, 121-140= 7 units, 141-180-=10 units, 181-240=12 units, 241-280=15 unites, 281-340=22 units, 341-400=25 units, >400=30 units       No current facility-administered medications for this visit.     Allergies:  Allergies  Allergen Reactions  . Penicillins Other (See Comments)    unknown    Past Medical History, Surgical history, Social history, and Family History were reviewed and updated.  Review of Systems:  Remaining ROS negative. Physical Exam: Blood pressure 139/61, pulse 90, temperature 98 F (36.7 C), temperature source Oral, resp. rate 18, height 6\' 1"  (1.854 m), weight 267 lb 11.2 oz (121.428 kg), SpO2 97.00%. ECOG:1 General appearance: alert but lethargic appeared in mild distress Head: Normocephalic, without obvious abnormality, atraumatic Neck: no adenopathy, no carotid bruit, no JVD, supple, symmetrical, trachea midline and thyroid not enlarged, symmetric, no tenderness/mass/nodules Lymph nodes: Cervical, supraclavicular, and axillary nodes normal. Heart:regular rate and rhythm, S1, S2 normal, no murmur, click, rub or gallop Lung: Scattered expiratory wheezes and  rhonchi noted on his examination. Abdomin: Dramatically improved splenomegaly on exam today. EXT:no erythema, induration, or nodules Skin examination showed diffuse erythema noted on the back, lower extremities and lower abdomen area. The rash did not appear vesicular in nature.  Lab Results: Lab Results  Component Value Date   WBC 6.4 10/17/2013    HGB 14.1 10/17/2013   HCT 42.1 10/17/2013   MCV 88.9 10/17/2013   PLT 193 10/17/2013     Chemistry      Component Value Date/Time   NA 134* 08/15/2013 1428   NA 129* 07/25/2013 0955   K 4.0 08/15/2013 1428   K 4.6 07/25/2013 0955   CL 93* 07/25/2013 0955   CO2 23 08/15/2013 1428   CO2 19 07/25/2013 0955   BUN 12.2 08/15/2013 1428   BUN 23 07/25/2013 0955   CREATININE 1.2 08/15/2013 1428   CREATININE 0.95 07/25/2013 0955      Component Value Date/Time   CALCIUM 9.6 08/15/2013 1428   CALCIUM 8.9 07/25/2013 0955   ALKPHOS 251* 08/15/2013 1428   ALKPHOS 182* 07/25/2013 0955   AST 16 08/15/2013 1428   AST 18 07/25/2013 0955   ALT 15 08/15/2013 1428   ALT 14 07/25/2013 0955   BILITOT 0.69 08/15/2013 1428   BILITOT 0.6 07/25/2013 0955      Impression and Plan:  63 year old gentleman with the following issues:  1. Diagnosis of non-Hodgkin's lymphoma presented with massive splenomegaly and extensive upper abdominal lymphadenopathy with a diagnosis confirmed in July of 2014. He is status post 5 cycles of chemotherapy. His CT scan on 08/15/2013. He has clear-cut response to chemotherapy with resolution of his splenomegaly and lymphadenopathy. For the time being, I see no residual clearcut cancer that warrants any further chemotherapy and we will proceed with observation and surveillance. I will repeat imaging studies and 6 months.    2. CT findings of interstitial lung disease:  He is following up with pulmonary medicine.  3. Diffuse rash: He is following up with dermatology in the near future for an evaluation.   4. Followup: Followup will be in 3 months time.   [facility], MD 3/10/201510:28 AM

## 2013-10-25 ENCOUNTER — Encounter: Payer: BC Managed Care – PPO | Admitting: Internal Medicine

## 2013-11-08 ENCOUNTER — Ambulatory Visit (AMBULATORY_SURGERY_CENTER): Payer: Self-pay

## 2013-11-08 VITALS — Ht 73.0 in | Wt 278.0 lb

## 2013-11-08 DIAGNOSIS — Z860100 Personal history of colon polyps, unspecified: Secondary | ICD-10-CM

## 2013-11-08 MED ORDER — MOVIPREP 100 G PO SOLR: 1.0000 | Freq: Once | ORAL | Status: DC

## 2013-11-10 ENCOUNTER — Encounter: Payer: Self-pay | Admitting: Internal Medicine

## 2013-11-22 ENCOUNTER — Ambulatory Visit (AMBULATORY_SURGERY_CENTER): Payer: BC Managed Care – PPO | Admitting: Internal Medicine

## 2013-11-22 ENCOUNTER — Encounter: Payer: Self-pay | Admitting: Internal Medicine

## 2013-11-22 VITALS — BP 149/92 | HR 79 | Temp 98.6°F | Resp 18 | Ht 73.0 in | Wt 278.0 lb

## 2013-11-22 DIAGNOSIS — Z86010 Personal history of colonic polyps: Secondary | ICD-10-CM

## 2013-11-22 DIAGNOSIS — D126 Benign neoplasm of colon, unspecified: Secondary | ICD-10-CM

## 2013-11-22 LAB — GLUCOSE, CAPILLARY
Glucose-Capillary: 287 mg/dL — ABNORMAL HIGH (ref 70–99)
Glucose-Capillary: 246 mg/dL — ABNORMAL HIGH (ref 70–99)

## 2013-11-22 MED ORDER — SODIUM CHLORIDE 0.9 % IV SOLN: 500.0000 mL | INTRAVENOUS | Status: DC

## 2013-11-22 NOTE — Progress Notes (Signed)
Called to room to assist during endoscopic procedure.  Patient ID and intended procedure confirmed with present staff. Received instructions for my participation in the procedure from the performing physician.  

## 2013-11-22 NOTE — Patient Instructions (Signed)
YOU HAD AN ENDOSCOPIC PROCEDURE TODAY AT THE [facility]: Refer to the procedure report that was given to you for any specific questions about what was found during the examination.  If the procedure report does not answer your questions, please call your gastroenterologist to clarify.  If you requested that your care partner not be given the details of your procedure findings, then the procedure report has been included in a sealed envelope for you to review at your convenience later.  YOU SHOULD EXPECT: Some feelings of bloating in the abdomen. Passage of more gas than usual.  Walking can help get rid of the air that was put into your GI tract during the procedure and reduce the bloating. If you had a lower endoscopy (such as a colonoscopy or flexible sigmoidoscopy) you may notice spotting of blood in your stool or on the toilet paper. If you underwent a bowel prep for your procedure, then you may not have a normal bowel movement for a few days.  DIET: Your first meal following the procedure should be a light meal and then it is ok to progress to your normal diet.  A half-sandwich or bowl of soup is an example of a good first meal.  Heavy or fried foods are harder to digest and may make you feel nauseous or bloated.  Likewise meals heavy in dairy and vegetables can cause extra gas to form and this can also increase the bloating.  Drink plenty of fluids but you should avoid alcoholic beverages for 24 hours.  ACTIVITY: Your care partner should take you home directly after the procedure.  You should plan to take it easy, moving slowly for the rest of the day.  You can resume normal activity the day after the procedure however you should NOT DRIVE or use heavy machinery for 24 hours (because of the sedation medicines used during the test).    SYMPTOMS TO REPORT IMMEDIATELY: A gastroenterologist can be reached at any hour.  During normal business hours, 8:30 AM to 5:00 PM Monday through Friday,  call [phone].  After hours and on weekends, please call the GI answering service at [phone] who will take a message and have the physician on call contact you.   Following lower endoscopy (colonoscopy or flexible sigmoidoscopy):  Excessive amounts of blood in the stool  Significant tenderness or worsening of abdominal pains  Swelling of the abdomen that is new, acute  Fever of 100F or higher   FOLLOW UP: If any biopsies were taken you will be contacted by phone or by letter within the next 1-3 weeks.  Call your gastroenterologist if you have not heard about the biopsies in 3 weeks.  Our staff will call the home number listed on your records the next business day following your procedure to check on you and address any questions or concerns that you may have at that time regarding the information given to you following your procedure. This is a courtesy call and so if there is no answer at the home number and we have not heard from you through the emergency physician on call, we will assume that you have returned to your regular daily activities without incident.  SIGNATURES/CONFIDENTIALITY: You and/or your care partner have signed paperwork which will be entered into your electronic medical record.  These signatures attest to the fact that that the information above on your After Visit Summary has been reviewed and is understood.  Full responsibility of the confidentiality of   this discharge information lies with you and/or your care-partner.   Resume medications. Information given on polyps,diverticulosis and high fiber diet with discharge instructions. 

## 2013-11-22 NOTE — Progress Notes (Signed)
Report to pacu rn, vss, bbs=clear 

## 2013-11-22 NOTE — Op Note (Signed)
[facility]  [address] [place], [address]   COLONOSCOPY PROCEDURE REPORT  PATIENT: Micheal Thompson, Micheal Thompson  MR#: [id number] BIRTHDATE: 07-30-1951 , 62  yrs. old GENDER: Male ENDOSCOPIST: Eustace Quail, MD REFERRED MC:EYEM Perini, M.D. PROCEDURE DATE:  11/22/2013 PROCEDURE:   Colonoscopy with snare polypectomy x4 First Screening Colonoscopy - Avg.  risk and is 50 yrs.  old or older - No.  Prior Negative Screening - Now for repeat screening. N/A  History of Adenoma - Now for follow-up colonoscopy & has been > or = to 3 yrs.  Yes hx of adenoma.  Has been 3 or more years since last colonoscopy.  Polyps Removed Today? Yes. ASA CLASS:   Class III INDICATIONS:Patient's personal history of adenomatous colon polyps. Index exam November 2004 with multiple (4) adenomas. Overdue for followup. MEDICATIONS: MAC sedation, administered by CRNA and propofol (Diprivan) 370mg  IV  DESCRIPTION OF PROCEDURE:   After the risks benefits and alternatives of the procedure were thoroughly explained, informed consent was obtained.  A digital rectal exam revealed no abnormalities of the rectum.   The LB [id number]  endoscope was introduced through the anus and advanced to the cecum, which was identified by both the appendix and ileocecal valve. No adverse events experienced.   The quality of the prep was excellent, using MoviPrep  The instrument was then slowly withdrawn as the colon was fully examined.  COLON FINDINGS: Four polyps ranging between 5-79mm in size were found in the descending and sigmoid colon.  A polypectomy was performed with a cold snare.  The resection was complete and the polyp tissue was completely retrieved.   Moderate diverticulosis was noted throughout the entire examined colon.   The colon mucosa was otherwise normal.  Retroflexed views revealed internal hemorrhoids. The time to cecum=2 minutes 13 seconds.  Withdrawal time=16 minutes 04 seconds.  The scope  was withdrawn and the procedure completed. COMPLICATIONS: There were no complications.  ENDOSCOPIC IMPRESSION: 1.   Four polyps  were found in the descending colon and sigmoid colon; polypectomy was performed with a cold snare 2.   Moderate diverticulosis was noted throughout the entire examined colon 3.   The colon mucosa was otherwise normal  RECOMMENDATIONS: 1. Repeat Colonoscopy in 3 years.   eSigned:  Eustace Quail, MD 11/22/2013 12:30 PM   cc: The Patient and Crist Infante, MD   PATIENT NAME:  [name], [name] MR#: [id number]

## 2013-11-23 ENCOUNTER — Telehealth: Payer: Self-pay | Admitting: *Deleted

## 2013-11-23 NOTE — Telephone Encounter (Signed)
Did not call pt for f/u due to message left from admitting saying do not call for f/u

## 2013-11-27 ENCOUNTER — Encounter: Payer: Self-pay | Admitting: Internal Medicine

## 2014-01-24 ENCOUNTER — Telehealth: Payer: Self-pay | Admitting: Oncology

## 2014-01-24 ENCOUNTER — Ambulatory Visit (HOSPITAL_BASED_OUTPATIENT_CLINIC_OR_DEPARTMENT_OTHER): Payer: BC Managed Care – PPO | Admitting: Oncology

## 2014-01-24 ENCOUNTER — Encounter: Payer: Self-pay | Admitting: Oncology

## 2014-01-24 ENCOUNTER — Other Ambulatory Visit (HOSPITAL_BASED_OUTPATIENT_CLINIC_OR_DEPARTMENT_OTHER): Payer: BC Managed Care – PPO

## 2014-01-24 VITALS — BP 142/68 | HR 65 | Temp 97.3°F | Resp 18 | Ht 73.0 in | Wt 270.6 lb

## 2014-01-24 DIAGNOSIS — M2550 Pain in unspecified joint: Secondary | ICD-10-CM

## 2014-01-24 DIAGNOSIS — C8319 Mantle cell lymphoma, extranodal and solid organ sites: Secondary | ICD-10-CM

## 2014-01-24 DIAGNOSIS — R5381 Other malaise: Secondary | ICD-10-CM

## 2014-01-24 DIAGNOSIS — R5383 Other fatigue: Secondary | ICD-10-CM

## 2014-01-24 DIAGNOSIS — C8590 Non-Hodgkin lymphoma, unspecified, unspecified site: Secondary | ICD-10-CM

## 2014-01-24 LAB — CBC WITH DIFFERENTIAL/PLATELET
BASO%: 0.1 % (ref 0.0–2.0)
Basophils Absolute: 0 10*3/uL (ref 0.0–0.1)
EOS%: 3.2 % (ref 0.0–7.0)
Eosinophils Absolute: 0.2 10*3/uL (ref 0.0–0.5)
HCT: 43.5 % (ref 38.4–49.9)
HGB: 14.6 g/dL (ref 13.0–17.1)
LYMPH%: 5 % — AB (ref 14.0–49.0)
MCH: 30 pg (ref 27.2–33.4)
MCHC: 33.5 g/dL (ref 32.0–36.0)
MCV: 89.4 fL (ref 79.3–98.0)
MONO#: 0.4 10*3/uL (ref 0.1–0.9)
MONO%: 7.5 % (ref 0.0–14.0)
NEUT%: 84.2 % — ABNORMAL HIGH (ref 39.0–75.0)
NEUTROS ABS: 4.2 10*3/uL (ref 1.5–6.5)
PLATELETS: 179 10*3/uL (ref 140–400)
RBC: 4.86 10*6/uL (ref 4.20–5.82)
RDW: 14 % (ref 11.0–14.6)
WBC: 4.9 10*3/uL (ref 4.0–10.3)
lymph#: 0.2 10*3/uL — ABNORMAL LOW (ref 0.9–3.3)

## 2014-01-24 LAB — COMPREHENSIVE METABOLIC PANEL (CC13)
ALBUMIN: 3.9 g/dL (ref 3.5–5.0)
ALT: 27 U/L (ref 0–55)
AST: 31 U/L (ref 5–34)
Alkaline Phosphatase: 136 U/L (ref 40–150)
Anion Gap: 10 mEq/L (ref 3–11)
BILIRUBIN TOTAL: 0.86 mg/dL (ref 0.20–1.20)
BUN: 21.8 mg/dL (ref 7.0–26.0)
CO2: 23 mEq/L (ref 22–29)
Calcium: 8.9 mg/dL (ref 8.4–10.4)
Chloride: 109 mEq/L (ref 98–109)
Creatinine: 1.1 mg/dL (ref 0.7–1.3)
Glucose: 190 mg/dl — ABNORMAL HIGH (ref 70–140)
POTASSIUM: 4.9 meq/L (ref 3.5–5.1)
Sodium: 141 mEq/L (ref 136–145)
TOTAL PROTEIN: 6.9 g/dL (ref 6.4–8.3)

## 2014-01-24 LAB — TECHNOLOGIST REVIEW

## 2014-01-24 NOTE — Telephone Encounter (Signed)
gv and printed appt sched and avs for pt for SEpt....gv pt barium

## 2014-01-24 NOTE — Progress Notes (Signed)
Hematology and Oncology Follow Up Visit  Micheal Thompson [id number] 01/08/1951 63 y.o. 01/24/2014 10:15 AM   Principle Diagnosis: 63 year old gentleman  presented with splenomegaly and lymphadenopathy. His workup showed mantle cell lymphoma diagnosed in July of 2014.  Current therapy: Chemotherapy with  bendamustine and rituximab started on 02/28/2013. He is S/P 5 cycles completed in 07/2013.   Interim History: Micheal Thompson presents today for a followup visit. Since his visit,  he had been feeling slightly worse. He continues to have generalized fatigue and arthralgias. His breathing has improved with decrease in the amount of productive cough. He is reporting diffuse arthralgias and myalgias which has continued to be an issue for him. His appetite had been reasonable but does report occasional early satiety and abdominal bloating. He does not report any headaches or blurry vision or double vision. Does not report any syncope. Does not report any chest pain does report some occasional exertional dyspnea. Does not report any cough or hemoptysis. Report any nausea or vomiting or hematochezia. Does report some abdominal distention as mentioned. Does not report any frequency urgency or hesitancy. Does not report any lymphadenopathy or petechiae. His rest of review of systems unremarkable.  Medications: I have reviewed the patient's current medications.  Current Outpatient Prescriptions  Medication Sig Dispense Refill  . atorvastatin (LIPITOR) 80 MG tablet Take 80 mg by mouth daily.      . diphenhydrAMINE (BENADRYL) 25 mg capsule Take 25 mg by mouth every 6 (six) hours as needed (sleep).      Marland Kitchen HYDROcodone-acetaminophen (NORCO/VICODIN) 5-325 MG per tablet This is the same medicine you have at home, but you can take one or two tablets every 4 hours for pain after your surgery as needed.  Do not allow yourself to have more than 4000 mg of acetaminophen per day.  40 tablet  0  . hydrOXYzine (ATARAX/VISTARIL) 10  MG tablet       . insulin NPH (HUMULIN N,NOVOLIN N) 100 UNIT/ML injection Inject 40 Units into the skin 2 (two) times daily at 8 am and 10 pm.      . insulin regular (NOVOLIN R,HUMULIN R) 100 units/mL injection Inject 5-30 Units into the skin 3 (three) times daily before meals. Sliding scale 81-120= 5 units, 121-140= 7 units, 141-180-=10 units, 181-240=12 units, 241-280=15 unites, 281-340=22 units, 341-400=25 units, >400=30 units      . ketoconazole (NIZORAL) 2 % cream       . lovastatin (MEVACOR) 20 MG tablet Take 20 mg by mouth at bedtime.      . Multiple Vitamin (MULTIVITAMIN WITH MINERALS) TABS Take 1 tablet by mouth daily.      . promethazine (PHENERGAN) 25 MG tablet Take 1 tablet (25 mg total) by mouth every 6 (six) hours as needed for nausea.  30 tablet  0  . sertraline (ZOLOFT) 50 MG tablet Take 50 mg by mouth daily at 12 noon.       . sulfamethoxazole-trimethoprim (BACTRIM DS) 800-160 MG per tablet       . traZODone (DESYREL) 100 MG tablet Take 100 mg by mouth at bedtime.      . triamcinolone ointment (KENALOG) 0.1 %        No current facility-administered medications for this visit.     Allergies:  Allergies  Allergen Reactions  . Penicillins Other (See Comments)    unknown    Past Medical History, Surgical history, Social history, and Family History were reviewed and updated.    Physical Exam: Blood pressure  142/68, pulse 65, temperature 97.3 F (36.3 C), temperature source Oral, resp. rate 18, height 6\' 1"  (1.854 m), weight 270 lb 9.6 oz (122.743 kg). ECOG:1 General appearance: alert in any distress. Head: Normocephalic, without obvious abnormality, atraumatic Neck: no adenopathy Lymph nodes: Cervical, supraclavicular, and axillary nodes normal. Heart:regular rate and rhythm, S1, S2 normal, no murmur, click, rub or gallop Lung: Scattered expiratory wheezes and rhonchi noted on his examination. Abdomin: Soft, obese could not appreciate any splenomegaly. Good bowel  sounds noted EXT:no erythema, induration, or nodules   Impression and Plan:  63 year old gentleman with the following issues:  1. Diagnosis of non-Hodgkin's lymphoma presented with massive splenomegaly and extensive upper abdominal lymphadenopathy with a diagnosis confirmed in July of 2014. He is status post 5 cycles of chemotherapy. His CT scan on 08/15/2013. He has clear-cut response to chemotherapy with resolution of his splenomegaly and lymphadenopathy. He continues to have vague complaints that are suspicious for persistent lymphoma. I will obtain imaging studies in the near future and plan any salvage therapy with disease relapse. If he is no evidence of disease, will continue active surveillance. His laboratory data are still pending from today which we will review upon completion.  2. Generalized fatigue and arthralgias: Unclear etiology could be related to his lymphoma progression which we will investigate. If he is no evidence of relapse disease, we will consider dermatological evaluation.   3. Followup: Followup will be in 3 months time. Sooner if he has any relapse.  [facility], MD 6/17/201510:15 AM

## 2014-01-24 NOTE — Addendum Note (Signed)
Addended by: Wyatt Portela on: 01/24/2014 12:52 PM   Modules accepted: Orders

## 2014-01-31 ENCOUNTER — Ambulatory Visit (HOSPITAL_COMMUNITY)
Admission: RE | Admit: 2014-01-31 | Discharge: 2014-01-31 | Disposition: A | Discharge status: Home / Self Care | Payer: BC Managed Care – PPO | Source: Ambulatory Visit | Attending: Oncology | Admitting: Oncology

## 2014-01-31 ENCOUNTER — Encounter (HOSPITAL_COMMUNITY): Payer: Self-pay

## 2014-01-31 DIAGNOSIS — I7090 Unspecified atherosclerosis: Secondary | ICD-10-CM | POA: Insufficient documentation

## 2014-01-31 DIAGNOSIS — C8589 Other specified types of non-Hodgkin lymphoma, extranodal and solid organ sites: Secondary | ICD-10-CM | POA: Insufficient documentation

## 2014-01-31 DIAGNOSIS — Z9089 Acquired absence of other organs: Secondary | ICD-10-CM | POA: Insufficient documentation

## 2014-01-31 DIAGNOSIS — C8590 Non-Hodgkin lymphoma, unspecified, unspecified site: Secondary | ICD-10-CM

## 2014-01-31 DIAGNOSIS — I2584 Coronary atherosclerosis due to calcified coronary lesion: Secondary | ICD-10-CM | POA: Insufficient documentation

## 2014-01-31 DIAGNOSIS — K439 Ventral hernia without obstruction or gangrene: Secondary | ICD-10-CM | POA: Insufficient documentation

## 2014-01-31 DIAGNOSIS — J984 Other disorders of lung: Secondary | ICD-10-CM | POA: Insufficient documentation

## 2014-01-31 MED ORDER — IOHEXOL 300 MG/ML  SOLN
100.0000 mL | Freq: Once | INTRAMUSCULAR | Status: AC | PRN
  Administered 2014-01-31: 100 mL via INTRAVENOUS

## 2014-02-01 ENCOUNTER — Telehealth: Payer: Self-pay | Admitting: *Deleted

## 2014-02-01 NOTE — Telephone Encounter (Signed)
Spoke with patient, let him know that his CT scan showed no cancer or lymphoma.

## 2014-02-01 NOTE — Telephone Encounter (Signed)
Message copied by Randolm Idol on Thu Feb 01, 2014 10:24 AM ------      Message from: Wyatt Portela      Created: Thu Feb 01, 2014  8:32 AM       Please let him know that his scan showed no cancer or lymphoma. ------

## 2014-03-27 ENCOUNTER — Ambulatory Visit (INDEPENDENT_AMBULATORY_CARE_PROVIDER_SITE_OTHER): Payer: BC Managed Care – PPO | Admitting: Internal Medicine

## 2014-03-27 ENCOUNTER — Encounter: Payer: Self-pay | Admitting: Internal Medicine

## 2014-03-27 VITALS — BP 144/80 | HR 93

## 2014-03-27 DIAGNOSIS — I2510 Atherosclerotic heart disease of native coronary artery without angina pectoris: Secondary | ICD-10-CM

## 2014-03-27 DIAGNOSIS — IMO0001 Reserved for inherently not codable concepts without codable children: Secondary | ICD-10-CM

## 2014-03-27 DIAGNOSIS — R072 Precordial pain: Secondary | ICD-10-CM

## 2014-03-27 DIAGNOSIS — R002 Palpitations: Secondary | ICD-10-CM | POA: Insufficient documentation

## 2014-03-27 DIAGNOSIS — R931 Abnormal findings on diagnostic imaging of heart and coronary circulation: Secondary | ICD-10-CM | POA: Insufficient documentation

## 2014-03-27 DIAGNOSIS — IMO0002 Reserved for concepts with insufficient information to code with codable children: Secondary | ICD-10-CM

## 2014-03-27 DIAGNOSIS — R079 Chest pain, unspecified: Secondary | ICD-10-CM | POA: Insufficient documentation

## 2014-03-27 DIAGNOSIS — R0789 Other chest pain: Secondary | ICD-10-CM

## 2014-03-27 DIAGNOSIS — E1165 Type 2 diabetes mellitus with hyperglycemia: Secondary | ICD-10-CM

## 2014-03-27 DIAGNOSIS — R55 Syncope and collapse: Secondary | ICD-10-CM | POA: Insufficient documentation

## 2014-03-27 NOTE — Progress Notes (Signed)
Patient ID: Micheal Thompson, male   DOB: [birth year], 63 y.o.   MRN: [id number]    OFFICE NOTE  Chief Complaint:  Chest pain and syncope  Primary Care Physician: Jerlyn Ly, MD  HPI:  Micheal Thompson is a 63 yo male with a history of hyperlipidemia, hypertension, diabetes, and non-hodgkins lymphoma s/p chemotherapy. Here for a new patient visit.  Patient reports occasional chest pain, that has become more prevalent over the past several months. Last MRI for follow-up of non-hodgkins lymphoma revealed blockages in vessels around the heart. Left sided pain. Described as sharp pain. No pressure. Notes this is not necessarily exertional as he stops himself before he gets to exertion if he gets the pain. The pain will resolve slowly with rest. Notes some shortness of breath with exertion. No orthopnea. Notes waking up in the middle of the night short a breath. Occurs 2-3 times a month. This is not as bad since he has gone through chemo.  Has had 2-3 episodes of syncope with last occurrence 6 weeks ago. States he notes his heart beats funny with this, though he states it beats funny all the time. Notes these syncopal episodes happened when he was working outside. Notes this occasionally happens when he is standing up from sitting. Notes it is a light headedness. The light headedness goes away if he sits back down. No witnessed events. Notes he feels fine afterward. No incontinence with there episodes.  Notes continuous weakness and numbness in arms and feet in the stocking and glove pattern, there has been no change to this recently. Notes he used to have significant edema, though not since he has had the chemo. He notes he finished his chemotherapy 6 months ago. He notes no recurrence of his lymphoma at this time.  PMHx:  Past Medical History  Diagnosis Date  . H/O colonoscopy 01/13/2013  . Hyperlipidemia   . Diabetes mellitus without complication   . Hypertension   . Depression   . H/O Bell's  palsy   . Pancarditis   . ED (erectile dysfunction)   . Colon polyps   . COPD (chronic obstructive pulmonary disease)   . GERD (gastroesophageal reflux disease)   . Non Hodgkin's lymphoma     Past Surgical History  Procedure Laterality Date  . Hernia repair Left   . Shoulder arthroscopy    . Cholecystectomy    . Laparoscopic appendectomy N/A 05/23/2013    Procedure: APPENDECTOMY LAPAROSCOPIC;  Surgeon: Shann Medal, MD;  Location: [facility];  Service: General;  Laterality: N/A;    FAMHx:  Family History  Problem Relation Age of Onset  . Cancer Maternal Grandmother     unknown  . Heart attack Brother   . Emphysema Paternal Grandfather     smoked  . Colon cancer Neg Hx   . Pancreatic cancer Neg Hx   . Stomach cancer Neg Hx   . Rectal cancer Neg Hx     SOCHx:   reports that he quit smoking about 25 years ago. His smoking use included Cigarettes. He has a 20 pack-year smoking history. He has never used smokeless tobacco. He reports that he does not drink alcohol or use illicit drugs.  ALLERGIES:  Allergies  Allergen Reactions  . Penicillins Other (See Comments)    unknown    ROS: A comprehensive review of systems was negative except for: Respiratory: positive for dyspnea on exertion Cardiovascular: positive for chest pain, dyspnea, irregular heart beat and syncope, light headedness Neurological:  positive for numbness stocking and glove pattern  HOME MEDS: Current Outpatient Prescriptions  Medication Sig Dispense Refill  . aspirin 81 MG tablet Take 81 mg by mouth daily.      Marland Kitchen atorvastatin (LIPITOR) 80 MG tablet Take 80 mg by mouth daily.      . B Complex-C (B-COMPLEX WITH VITAMIN C) tablet Take 1 tablet by mouth daily.      . Dapagliflozin Propanediol (FARXIGA) 10 MG TABS Take 1 tablet by mouth daily.      . ferrous sulfate 325 (65 FE) MG EC tablet Take 325 mg by mouth daily with breakfast.      . HYDROcodone-acetaminophen (NORCO/VICODIN) 5-325 MG per tablet This is  the same medicine you have at home, but you can take one or two tablets every 4 hours for pain after your surgery as needed.  Do not allow yourself to have more than 4000 mg of acetaminophen per day.  40 tablet  0  . insulin regular human CONCENTRATED (HUMULIN R) 500 UNIT/ML SOLN injection Inject into the skin 3 (three) times daily with meals. SSI      . lisinopril (PRINIVIL,ZESTRIL) 10 MG tablet Take 10 mg by mouth daily.      . Multiple Vitamins-Minerals (CENTRUM PO) Take by mouth daily.      . Omega-3 Fatty Acids (FISH OIL) 1000 MG CAPS Take by mouth daily.      . sertraline (ZOLOFT) 50 MG tablet Take 50 mg by mouth daily at 12 noon.       . traZODone (DESYREL) 100 MG tablet Take 100 mg by mouth at bedtime.       No current facility-administered medications for this visit.    LABS/IMAGING: No results found for this or any previous visit (from the past 48 hour(s)). No results found.  VITALS: BP 144/80  Pulse 93  EXAM: General appearance: alert, cooperative and no distress Neck: no adenopathy, no carotid bruit, no JVD and supple, symmetrical, trachea midline Lungs: clear to auscultation bilaterally Heart: regular rate and rhythm, S1, S2 normal, no murmur, click, rub or gallop Abdomen: soft, non-tender; bowel sounds normal; no masses,  no organomegaly Extremities: extremities normal, atraumatic, no cyanosis or edema Pulses: 2+ and symmetric Neurologic: CN 2-12 intact, 5/5 strength in bilateral biceps, triceps, grip, quads, hamstrings, plantar and dorsiflexion, sensation to light touch intact in bilateral UE and LE, normal gait, absent patellar reflexes bilaterally  EKG: NSR, rate of 93  ASSESSMENT: 1. Chest pain - atypical in nature, though with evidence of coronary calcification on recent imaging 2. Syncope - likely related to cardiac cause given palpitations prior to events. No signs of neurologic deficit to indicate this as a  cause. 3. Dyspnea 4. Hypertension 5. Hyperlipidemia 6. Diabetes mellitus type 2  PLAN: 1.   Patient presents for evaluation of his chest pain and syncope. Note chest pain is atypical in nature, though the patient has many risk factors for cardiac disease and observed coronary calcifications on imaging, so the main concern is for a cardiac cause of his chest pain. Given this we will obtain a lexiscan stress test to evaluate for coronary disease. If the results of this are negative we will consider additional work-up of his sensation of skipped heart beats with echo and event monitor. Will plan for this study some time in the next week. Patient will need to work on continued improvement of risk factors with improved glycemic control and cholesterol levels, both of which are followed by the patients  PCP. We will plan on seeing the patient back in the office following his stress test to discuss the options moving forward based on results.   Tommi Rumps 03/27/2014, 3:56 PM  Pt. Seen and examined. Agree with the resident note as written. 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 Despite his education and health care he has had poor control over his own medical problems. He recently had blood work which showed a markedly elevated hemoglobin A1c greater than 11% and was started on Farxiga which he reports is made a big improvement. He's also been describing left-sided chest pain and this is in the setting of recent diagnosis of non-Hodgkin's lymphoma for which he has been undergoing chemotherapy. CT scans have demonstrated coronary atherosclerosis and there is concern for underlying coronary artery disease. He's not had any functional testing for more than 20 years. He also reports heart palpitations however they were not captured today on his electrocardiogram. He also is reported several syncopal episodes for which she felt  palpitations prior to the episode. This is concerning for possible ischemic arrhythmia.  I agree with a LexiScan nuclear stress test to evaluate for coronary ischemia. Ultimately if this is abnormal he may need cardiac catheterization. If this is a low-risk study, he will likely need a monitor to evaluate for his palpitations and probably an echocardiogram to evaluate for any structural abnormalities that may be implicated in his syncopal episodes.  Thank you again for the kind referral.  Pixie Casino, MD, [facility] Attending Cardiologist [place]

## 2014-03-27 NOTE — Patient Instructions (Signed)
Your physician has requested that you have a lexiscan myoview. For further information please visit [email or web address]. Please follow instruction sheet, as given.  Your physician recommends that you schedule a follow-up appointment after your stress test.   

## 2014-03-30 ENCOUNTER — Telehealth (HOSPITAL_COMMUNITY): Payer: Self-pay

## 2014-03-30 NOTE — Telephone Encounter (Signed)
Encounter complete. 

## 2014-04-04 ENCOUNTER — Ambulatory Visit (HOSPITAL_COMMUNITY)
Admission: RE | Admit: 2014-04-04 | Discharge: 2014-04-04 | Disposition: A | Discharge status: Home / Self Care | Payer: BC Managed Care – PPO | Source: Ambulatory Visit | Attending: Cardiovascular Disease | Admitting: Cardiovascular Disease

## 2014-04-04 DIAGNOSIS — Z794 Long term (current) use of insulin: Secondary | ICD-10-CM | POA: Insufficient documentation

## 2014-04-04 DIAGNOSIS — E119 Type 2 diabetes mellitus without complications: Secondary | ICD-10-CM | POA: Diagnosis not present

## 2014-04-04 DIAGNOSIS — R079 Chest pain, unspecified: Secondary | ICD-10-CM | POA: Insufficient documentation

## 2014-04-04 DIAGNOSIS — R42 Dizziness and giddiness: Secondary | ICD-10-CM | POA: Insufficient documentation

## 2014-04-04 DIAGNOSIS — E669 Obesity, unspecified: Secondary | ICD-10-CM | POA: Diagnosis not present

## 2014-04-04 DIAGNOSIS — R0789 Other chest pain: Secondary | ICD-10-CM

## 2014-04-04 DIAGNOSIS — R55 Syncope and collapse: Secondary | ICD-10-CM

## 2014-04-04 DIAGNOSIS — Z8249 Family history of ischemic heart disease and other diseases of the circulatory system: Secondary | ICD-10-CM | POA: Insufficient documentation

## 2014-04-04 DIAGNOSIS — Z87891 Personal history of nicotine dependence: Secondary | ICD-10-CM | POA: Insufficient documentation

## 2014-04-04 DIAGNOSIS — I10 Essential (primary) hypertension: Secondary | ICD-10-CM | POA: Diagnosis not present

## 2014-04-04 DIAGNOSIS — R002 Palpitations: Secondary | ICD-10-CM

## 2014-04-04 DIAGNOSIS — R0609 Other forms of dyspnea: Secondary | ICD-10-CM | POA: Insufficient documentation

## 2014-04-04 DIAGNOSIS — R0989 Other specified symptoms and signs involving the circulatory and respiratory systems: Secondary | ICD-10-CM | POA: Diagnosis present

## 2014-04-04 MED ORDER — TECHNETIUM TC 99M SESTAMIBI GENERIC - CARDIOLITE
30.6000 | Freq: Once | INTRAVENOUS | Status: AC | PRN
  Administered 2014-04-04: 31 via INTRAVENOUS

## 2014-04-04 MED ORDER — REGADENOSON 0.4 MG/5ML IV SOLN
0.4000 mg | Freq: Once | INTRAVENOUS | Status: AC
  Administered 2014-04-04: 0.4 mg via INTRAVENOUS

## 2014-04-04 MED ORDER — TECHNETIUM TC 99M SESTAMIBI GENERIC - CARDIOLITE
10.1000 | Freq: Once | INTRAVENOUS | Status: AC | PRN
  Administered 2014-04-04: 10.1 via INTRAVENOUS

## 2014-04-04 NOTE — Procedures (Addendum)
[facility] [address] [place] [address] [phone]  Cardiology Nuclear Med Study  Micheal Thompson is a 63 y.o. male     MRN : [id number]     DOB: [birth year]  Procedure Date: 04/04/2014  Nuclear Med Background Indication for Stress Test:  Evaluation for Ischemia History:  Elevated Coronary Calcium Score, No prior MPI study for comparison Cardiac Risk Factors: Family History - CAD, History of Smoking, Hypertension, IDDM Type 2, Lipids and Obesity  Symptoms:  Chest Pain, DOE, Light-Headedness and Palpitations   Nuclear Pre-Procedure Caffeine/Decaff Intake:  1:00am NPO After: 11:00am   IV Site: R Antecubital  IV 0.9% NS with Angio Cath:  22g  Chest Size (in):  48" IV Started by: Otho Perl, CNMT  Height: 6\' 1"  (1.854 m)  Cup Size: n/a  BMI:  Body mass index is 35.63 kg/(m^2). Weight:  270 lb (122.471 kg)   Tech Comments:  n/a    Nuclear Med Study 1 or 2 day study: 1 day  Stress Test Type:  [place] Provider:  Minus Breeding, MD   Resting Radionuclide: Technetium 37m Sestamibi  Resting Radionuclide Dose: 10.1 mCi   Stress Radionuclide:  Technetium 97m Sestamibi  Stress Radionuclide Dose: 30.6 mCi           Stress Protocol Rest HR: 69 Stress HR: 103  Rest BP: 124/78 Stress BP: 148/71  Exercise Time (min): n/a METS: n/a   Predicted Max HR: 159 bpm % Max HR: 64.78 bpm Rate Pressure Product: 15244  Dose of Adenosine (mg):  n/a Dose of Lexiscan: 0.4 mg  Dose of Atropine (mg): n/a Dose of Dobutamine: n/a mcg/kg/min (at max HR)  Stress Test Technologist: Leane Para, CCT Nuclear Technologist: Imagene Riches, CNMT   Rest Procedure:  Myocardial perfusion imaging was performed at rest 45 minutes following the intravenous administration of Technetium 84m Sestamibi. Stress Procedure:  The patient received IV Lexiscan 0.4 mg over 15-seconds.  Technetium 32m Sestamibi injected at 30-seconds.   There were no significant changes with Lexiscan.  Quantitative spect images were obtained after a 45 minute delay.  Transient Ischemic Dilatation (Normal <1.22):  1.21  QGS EDV:  n/a ml QGS ESV:  n/a ml LV Ejection Fraction: Study not gated      Rest ECG: NSR-RBBB, very frequent PACs, rare PVCs  Stress ECG: No significant change from baseline ECG  QPS Raw Data Images:  Normal; no motion artifact; normal heart/lung ratio. Stress Images:  Normal homogeneous uptake in all areas of the myocardium. Rest Images:  Normal homogeneous uptake in all areas of the myocardium. Subtraction (SDS):  No evidence of ischemia. LV Wall Motion:  not gated  Impression Exercise Capacity:  Lexiscan with no exercise. BP Response:  Normal blood pressure response. Clinical Symptoms:  No significant symptoms noted. ECG Impression:  No significant ECG changes with Lexiscan. Comparison with Prior Nuclear Study: No previous nuclear study performed   Overall Impression:  Normal stress nuclear perfusion images. Gated images could not be obtained.   [name], MD  04/04/2014 3:47 PM

## 2014-04-18 ENCOUNTER — Ambulatory Visit (HOSPITAL_BASED_OUTPATIENT_CLINIC_OR_DEPARTMENT_OTHER): Payer: BC Managed Care – PPO | Admitting: Oncology

## 2014-04-18 ENCOUNTER — Encounter: Payer: Self-pay | Admitting: Oncology

## 2014-04-18 ENCOUNTER — Telehealth: Payer: Self-pay | Admitting: Oncology

## 2014-04-18 ENCOUNTER — Other Ambulatory Visit (HOSPITAL_BASED_OUTPATIENT_CLINIC_OR_DEPARTMENT_OTHER): Payer: BC Managed Care – PPO

## 2014-04-18 VITALS — BP 149/76 | HR 70 | Temp 98.4°F | Resp 19 | Ht 73.0 in | Wt 272.3 lb

## 2014-04-18 DIAGNOSIS — C8319 Mantle cell lymphoma, extranodal and solid organ sites: Secondary | ICD-10-CM

## 2014-04-18 DIAGNOSIS — C8590 Non-Hodgkin lymphoma, unspecified, unspecified site: Secondary | ICD-10-CM

## 2014-04-18 LAB — CBC WITH DIFFERENTIAL/PLATELET
BASO%: 0.8 % (ref 0.0–2.0)
Basophils Absolute: 0.1 10*3/uL (ref 0.0–0.1)
EOS ABS: 0.3 10*3/uL (ref 0.0–0.5)
EOS%: 3.9 % (ref 0.0–7.0)
HEMATOCRIT: 43.9 % (ref 38.4–49.9)
HGB: 14.5 g/dL (ref 13.0–17.1)
LYMPH%: 6.3 % — AB (ref 14.0–49.0)
MCH: 30.1 pg (ref 27.2–33.4)
MCHC: 33 g/dL (ref 32.0–36.0)
MCV: 91.2 fL (ref 79.3–98.0)
MONO#: 0.6 10*3/uL (ref 0.1–0.9)
MONO%: 8.9 % (ref 0.0–14.0)
NEUT%: 80.1 % — AB (ref 39.0–75.0)
NEUTROS ABS: 5.4 10*3/uL (ref 1.5–6.5)
PLATELETS: 180 10*3/uL (ref 140–400)
RBC: 4.81 10*6/uL (ref 4.20–5.82)
RDW: 14 % (ref 11.0–14.6)
WBC: 6.8 10*3/uL (ref 4.0–10.3)
lymph#: 0.4 10*3/uL — ABNORMAL LOW (ref 0.9–3.3)

## 2014-04-18 LAB — COMPREHENSIVE METABOLIC PANEL (CC13)
ALT: 18 U/L (ref 0–55)
ANION GAP: 11 meq/L (ref 3–11)
AST: 23 U/L (ref 5–34)
Albumin: 3.6 g/dL (ref 3.5–5.0)
Alkaline Phosphatase: 124 U/L (ref 40–150)
BILIRUBIN TOTAL: 0.3 mg/dL (ref 0.20–1.20)
BUN: 17.5 mg/dL (ref 7.0–26.0)
CO2: 18 meq/L — AB (ref 22–29)
CREATININE: 0.9 mg/dL (ref 0.7–1.3)
Calcium: 8.9 mg/dL (ref 8.4–10.4)
Chloride: 113 mEq/L — ABNORMAL HIGH (ref 98–109)
Glucose: 232 mg/dl — ABNORMAL HIGH (ref 70–140)
Potassium: 4.5 mEq/L (ref 3.5–5.1)
Sodium: 142 mEq/L (ref 136–145)
Total Protein: 6.8 g/dL (ref 6.4–8.3)

## 2014-04-18 NOTE — Progress Notes (Signed)
Hematology and Oncology Follow Up Visit  Micheal Thompson [id number] 1951/01/19 63 y.o. 04/18/2014 10:20 AM   Principle Diagnosis: 63 year old gentleman  presented with splenomegaly and lymphadenopathy. His workup showed mantle cell lymphoma diagnosed in July of 2014.  Current therapy: Chemotherapy with  bendamustine and rituximab started on 02/28/2013. He is S/P 5 cycles completed in 07/2013.   Interim History: Micheal Thompson presents today for a followup visit. Since his visit, he reports feeling well overall. He continues to have generalized fatigue and arthralgias have improved significantly. His breathing has improved with decrease in the amount of productive cough. He did have a stress test recently and potentially he will need coronary artery intervention His appetite had been reasonable but does not report occasional early satiety and abdominal bloating. He does not report any headaches or blurry vision or double vision. Does not report any syncope. Does not report any chest pain does report some occasional exertional dyspnea. Does not report any cough or hemoptysis. Report any nausea or vomiting or hematochezia. Does report some abdominal distention as mentioned. Does not report any frequency urgency or hesitancy. Does not report any lymphadenopathy or petechiae. His rest of review of systems unremarkable.  Medications: I have reviewed the patient's current medications.  Current Outpatient Prescriptions  Medication Sig Dispense Refill  . aspirin 81 MG tablet Take 81 mg by mouth daily.      Marland Kitchen atorvastatin (LIPITOR) 80 MG tablet Take 80 mg by mouth daily.      . B Complex-C (B-COMPLEX WITH VITAMIN C) tablet Take 1 tablet by mouth daily.      . Dapagliflozin Propanediol (FARXIGA) 10 MG TABS Take 1 tablet by mouth daily.      . ferrous sulfate 325 (65 FE) MG EC tablet Take 325 mg by mouth daily with breakfast.      . HYDROcodone-acetaminophen (NORCO/VICODIN) 5-325 MG per tablet This is the same  medicine you have at home, but you can take one or two tablets every 4 hours for pain after your surgery as needed.  Do not allow yourself to have more than 4000 mg of acetaminophen per day.  40 tablet  0  . insulin regular human CONCENTRATED (HUMULIN R) 500 UNIT/ML SOLN injection Inject into the skin 3 (three) times daily with meals. SSI      . lisinopril (PRINIVIL,ZESTRIL) 10 MG tablet Take 10 mg by mouth daily.      . Multiple Vitamins-Minerals (CENTRUM PO) Take by mouth daily.      . Omega-3 Fatty Acids (FISH OIL) 1000 MG CAPS Take by mouth daily.      . sertraline (ZOLOFT) 50 MG tablet Take 50 mg by mouth daily at 12 noon.       . traZODone (DESYREL) 100 MG tablet Take 100 mg by mouth at bedtime.       No current facility-administered medications for this visit.     Allergies:  Allergies  Allergen Reactions  . Penicillins Other (See Comments)    unknown    Past Medical History, Surgical history, Social history, and Family History were reviewed and updated.    Physical Exam: Blood pressure 149/76, pulse 70, temperature 98.4 F (36.9 C), temperature source Oral, resp. rate 19, height 6\' 1"  (1.854 m), weight 272 lb 4.8 oz (123.514 kg). ECOG:1 General appearance: alert in any distress. Head: Normocephalic, without obvious abnormality, atraumatic Neck: no adenopathy Lymph nodes: Cervical, supraclavicular, and axillary nodes normal. Heart:regular rate and rhythm, S1, S2 normal, no murmur, click, rub  or gallop Lung: Scattered expiratory wheezes and rhonchi noted on his examination. Abdomin: Soft, obese could not appreciate any splenomegaly. Good bowel sounds noted EXT:no erythema, induration, or nodules  EXAM:  CT CHEST, ABDOMEN, AND PELVIS WITH CONTRAST  TECHNIQUE:  Multidetector CT imaging of the chest, abdomen and pelvis was  performed following the standard protocol during bolus  administration of intravenous contrast.  CONTRAST: 131mL OMNIPAQUE IOHEXOL 300 MG/ML SOLN   COMPARISON: 08/15/2013.  FINDINGS:  CT CHEST FINDINGS  No pathologically enlarged mediastinal, hilar or axillary lymph  nodes. Extensive 3 vessel coronary artery calcification. Heart size  normal. No pericardial effusion.  Mild scarring in the inferior lingula. Lungs are otherwise clear. No  pleural fluid. Airway is unremarkable.  CT ABDOMEN AND PELVIS FINDINGS  Liver is unremarkable. Cholecystectomy. Adrenal glands are  unremarkable. Low-attenuation lesions in the right kidney measure up  to 2.2 cm, unchanged and likely cysts. Left renal cortical scarring.  Spleen, pancreas, stomach and bowel are unremarkable. Prostate is  mildly enlarged and contains calcification. Question mild bladder  wall thickening. Atherosclerotic calcification of the arterial  vasculature without abdominal aortic aneurysm. No pathologically  enlarged lymph nodes. No free fluid. Upper abdominal ventral hernia  contains fat. No worrisome lytic or sclerotic lesions.  IMPRESSION:  1. No evidence of recurrent lymphoma.  2. Extensive 3 vessel coronary artery calcifications.  3. Mildly enlarged prostate. Question mild bladder wall thickening,  suggesting the possibility of an element of bladder outlet  obstruction.  4. Upper abdominal ventral hernia contains fat.   Impression and Plan:  63 year old gentleman with the following issues:  1. Diagnosis of non-Hodgkin's lymphoma presented with massive splenomegaly and extensive upper abdominal lymphadenopathy with a diagnosis confirmed in July of 2014. He is status post 5 cycles of chemotherapy. His CT scan on 01/31/2014 was reviewed today and show no evidence of progression of disease. The plan is to continue active surveillance and repeat imaging studies in December of 2015  2. Generalized fatigue and arthralgias: Unclear etiology and has improved since last visit   3. Followup: Followup will be in 3 months time and repeat in 3 scan at that time.  [facility],  MD 9/9/201510:20 AM

## 2014-04-18 NOTE — Telephone Encounter (Signed)
gv and printed appts sched and avs for pt for Dec...sed gv barium

## 2014-05-07 ENCOUNTER — Ambulatory Visit (INDEPENDENT_AMBULATORY_CARE_PROVIDER_SITE_OTHER): Payer: BC Managed Care – PPO | Admitting: Internal Medicine

## 2014-05-07 ENCOUNTER — Encounter: Payer: Self-pay | Admitting: Internal Medicine

## 2014-05-07 VITALS — BP 145/79 | HR 85 | Ht 73.0 in | Wt 265.4 lb

## 2014-05-07 DIAGNOSIS — R002 Palpitations: Secondary | ICD-10-CM

## 2014-05-07 DIAGNOSIS — R072 Precordial pain: Secondary | ICD-10-CM

## 2014-05-07 DIAGNOSIS — E1165 Type 2 diabetes mellitus with hyperglycemia: Secondary | ICD-10-CM

## 2014-05-07 DIAGNOSIS — R739 Hyperglycemia, unspecified: Secondary | ICD-10-CM

## 2014-05-07 DIAGNOSIS — IMO0002 Reserved for concepts with insufficient information to code with codable children: Secondary | ICD-10-CM

## 2014-05-07 DIAGNOSIS — R7309 Other abnormal glucose: Secondary | ICD-10-CM

## 2014-05-07 DIAGNOSIS — C8589 Other specified types of non-Hodgkin lymphoma, extranodal and solid organ sites: Secondary | ICD-10-CM

## 2014-05-07 DIAGNOSIS — IMO0001 Reserved for inherently not codable concepts without codable children: Secondary | ICD-10-CM

## 2014-05-07 DIAGNOSIS — C8590 Non-Hodgkin lymphoma, unspecified, unspecified site: Secondary | ICD-10-CM

## 2014-05-07 MED ORDER — METOPROLOL SUCCINATE ER 25 MG PO TB24: 12.5000 mg | ORAL_TABLET | Freq: Every day | ORAL | Status: DC

## 2014-05-07 NOTE — Patient Instructions (Signed)
Your physician wants you to follow-up in: 6 months. You will receive a reminder letter in the mail two months in advance. If you don't receive a letter, please call our office to schedule the follow-up appointment.  Your physician has recommended you make the following change in your medication: START toprol XL 12.5mg  once daily (at bedtime)

## 2014-05-07 NOTE — Progress Notes (Signed)
Patient ID: Micheal Thompson, male   DOB: Nov 05, 1950, 63 y.o.   MRN: [id number]    OFFICE NOTE  Chief Complaint:  Chest pain and syncope  Primary Care Physician: Jerlyn Ly, MD  HPI:  Micheal Thompson is a 63 yo male with a history of hyperlipidemia, hypertension, diabetes, and non-hodgkins lymphoma s/p chemotherapy. Here for a new patient visit.  Patient reports occasional chest pain, that has become more prevalent over the past several months. Last MRI for follow-up of non-hodgkins lymphoma revealed blockages in vessels around the heart. Left sided pain. Described as sharp pain. No pressure. Notes this is not necessarily exertional as he stops himself before he gets to exertion if he gets the pain. The pain will resolve slowly with rest. Notes some shortness of breath with exertion. No orthopnea. Notes waking up in the middle of the night short a breath. Occurs 2-3 times a month. This is not as bad since he has gone through chemo.  Has had 2-3 episodes of syncope with last occurrence 6 weeks ago. States he notes his heart beats funny with this, though he states it beats funny all the time. Notes these syncopal episodes happened when he was working outside. Notes this occasionally happens when he is standing up from sitting. Notes it is a light headedness. The light headedness goes away if he sits back down. No witnessed events. Notes he feels fine afterward. No incontinence with there episodes.  Notes continuous weakness and numbness in arms and feet in the stocking and glove pattern, there has been no change to this recently. Notes he used to have significant edema, though not since he has had the chemo. He notes he finished his chemotherapy 6 months ago. He notes no recurrence of his lymphoma at this time.  Micheal Thompson returns today for followup of his nuclear stress test. This was negative for ischemia. He reports he still has some palpitations. He was previously on beta blocker many years  ago.  PMHx:  Past Medical History  Diagnosis Date  . H/O colonoscopy 01/13/2013  . Hyperlipidemia   . Diabetes mellitus without complication   . Hypertension   . Depression   . H/O Bell's palsy   . Pancarditis   . ED (erectile dysfunction)   . Colon polyps   . COPD (chronic obstructive pulmonary disease)   . GERD (gastroesophageal reflux disease)   . Non Hodgkin's lymphoma     Past Surgical History  Procedure Laterality Date  . Hernia repair Left   . Shoulder arthroscopy    . Cholecystectomy    . Laparoscopic appendectomy N/A 05/23/2013    Procedure: APPENDECTOMY LAPAROSCOPIC;  Surgeon: Shann Medal, MD;  Location: [facility];  Service: General;  Laterality: N/A;    FAMHx:  Family History  Problem Relation Age of Onset  . Cancer Maternal Grandmother     unknown  . Heart attack Brother   . Emphysema Paternal Grandfather     smoked  . Colon cancer Neg Hx   . Pancreatic cancer Neg Hx   . Stomach cancer Neg Hx   . Rectal cancer Neg Hx     SOCHx:   reports that he quit smoking about 25 years ago. His smoking use included Cigarettes. He has a 20 pack-year smoking history. He has never used smokeless tobacco. He reports that he does not drink alcohol or use illicit drugs.  ALLERGIES:  Allergies  Allergen Reactions  . Penicillins Other (See Comments)    unknown  ROS: A comprehensive review of systems was negative except for: Respiratory: positive for dyspnea on exertion Cardiovascular: positive for chest pain, dyspnea, irregular heart beat and syncope, light headedness Neurological: positive for numbness stocking and glove pattern  HOME MEDS: Current Outpatient Prescriptions  Medication Sig Dispense Refill  . aspirin 81 MG tablet Take 81 mg by mouth daily.      Marland Kitchen atorvastatin (LIPITOR) 80 MG tablet Take 80 mg by mouth daily.      . B Complex-C (B-COMPLEX WITH VITAMIN C) tablet Take 1 tablet by mouth daily.      . Dapagliflozin Propanediol (FARXIGA) 10 MG TABS  Take 1 tablet by mouth daily.      . ferrous sulfate 325 (65 FE) MG EC tablet Take 325 mg by mouth daily with breakfast.      . HYDROcodone-acetaminophen (NORCO/VICODIN) 5-325 MG per tablet This is the same medicine you have at home, but you can take one or two tablets every 4 hours for pain after your surgery as needed.  Do not allow yourself to have more than 4000 mg of acetaminophen per day.  40 tablet  0  . insulin regular human CONCENTRATED (HUMULIN R) 500 UNIT/ML SOLN injection Inject into the skin 3 (three) times daily with meals. SSI      . lisinopril (PRINIVIL,ZESTRIL) 10 MG tablet Take 10 mg by mouth daily.      . Multiple Vitamins-Minerals (CENTRUM PO) Take by mouth daily.      . Omega-3 Fatty Acids (FISH OIL) 1000 MG CAPS Take by mouth daily.      . sertraline (ZOLOFT) 50 MG tablet Take 50 mg by mouth daily at 12 noon.       . traZODone (DESYREL) 100 MG tablet Take 100 mg by mouth at bedtime.      . metoprolol succinate (TOPROL-XL) 25 MG 24 hr tablet Take 0.5 tablets (12.5 mg total) by mouth daily.  45 tablet  3   No current facility-administered medications for this visit.    LABS/IMAGING: No results found for this or any previous visit (from the past 48 hour(s)). No results found.  VITALS: BP 145/79  Pulse 85  Ht 6\' 1"  (1.854 m)  Wt 265 lb 6.4 oz (120.385 kg)  BMI 35.02 kg/m2  EXAM: deferred  EKG: deferred  ASSESSMENT: 1. Chest pain - atypical in nature, though with evidence of coronary calcification on recent imaging 2. Syncope - likely related to cardiac cause given palpitations prior to events. No signs of neurologic deficit to indicate this as a cause. 3. Dyspnea 4. Hypertension 5. Hyperlipidemia 6. Diabetes mellitus type 2 7. Palpitations  PLAN: 1.   Micheal Thompson had a negative nuclear stress test which is somewhat reassuring although he does have atherosclerosis which is being adequately treated. He is having some palpitations that have previously been on a  beta blocker in the past. I recommend starting low-dose Toprol-XL 12.5 mg daily at night to help with his palpitations. This will also help somewhat with his blood pressure which is mildly elevated. Plan followup in 6 months.  Pixie Casino, MD, [facility] Attending Cardiologist [facility]   Sydney Hasten C 05/07/2014, 10:47 AM

## 2014-07-02 IMAGING — CT CT ABD-PELV W/ CM
2 of 4 series · 17 of 46 positions shown, 19 images · IV contrast (OMNIPAQUE)
Comparison: 08/15/2013.

CLINICAL DATA: Non-Hodgkin's lymphoma.

EXAM:
CT CHEST, ABDOMEN, AND PELVIS WITH CONTRAST
TECHNIQUE: Multidetector CT imaging of the chest, abdomen and pelvis was
performed following the standard protocol during bolus
administration of intravenous contrast.
CONTRAST:  100mL OMNIPAQUE IOHEXOL 300 MG/ML  SOLN

[Series 2: cap with st · axial · 0.87mm/px · z∈[-692,-52]mm · 14 of 140 slices shown, 16 images]
[im 6/140  soft-tissue]
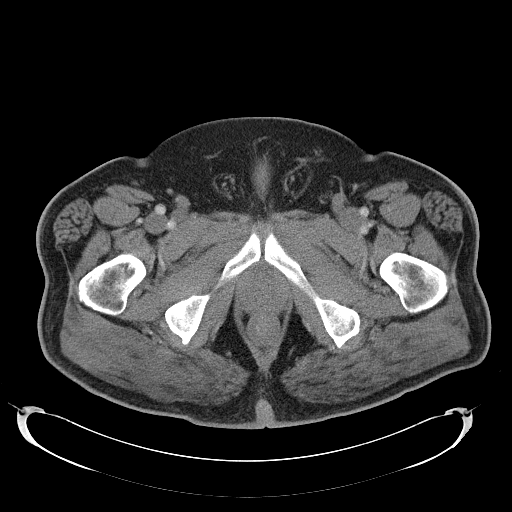
[im 6/140  bone]
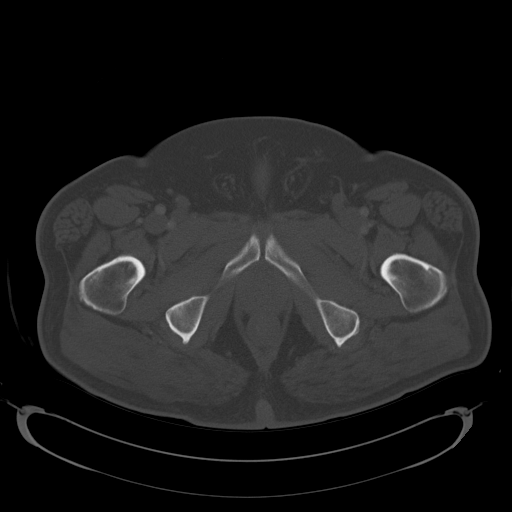
[im 17/140  soft-tissue]
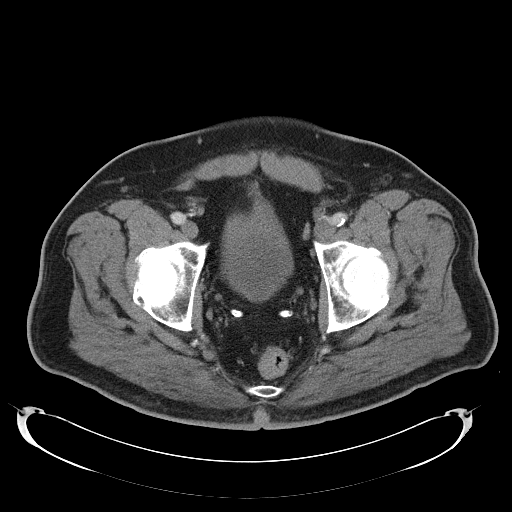
[im 28/140  soft-tissue]
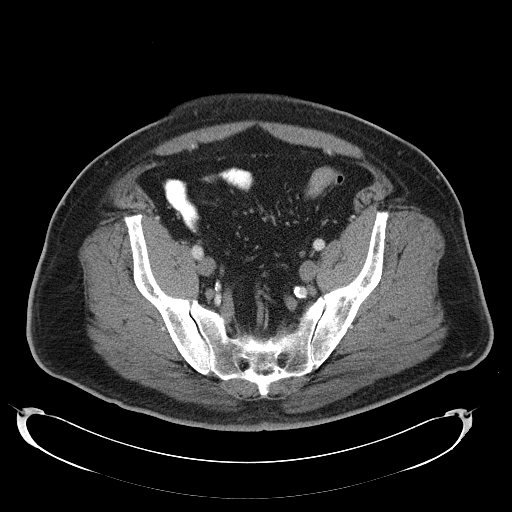
[im 39/140  soft-tissue]
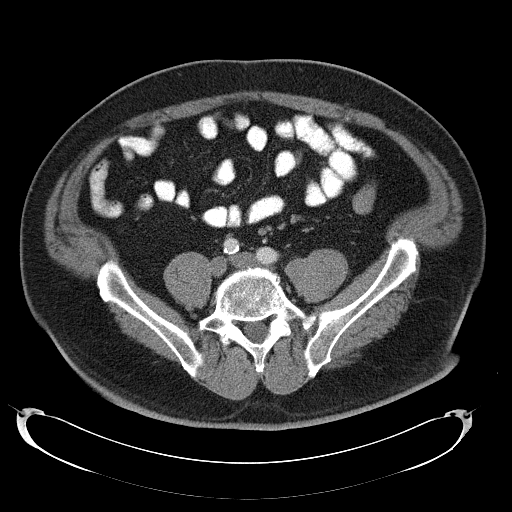
[im 45/140  soft-tissue]
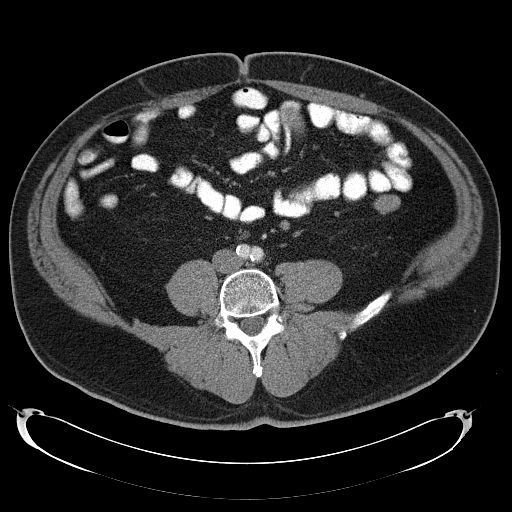
[im 56/140  soft-tissue]
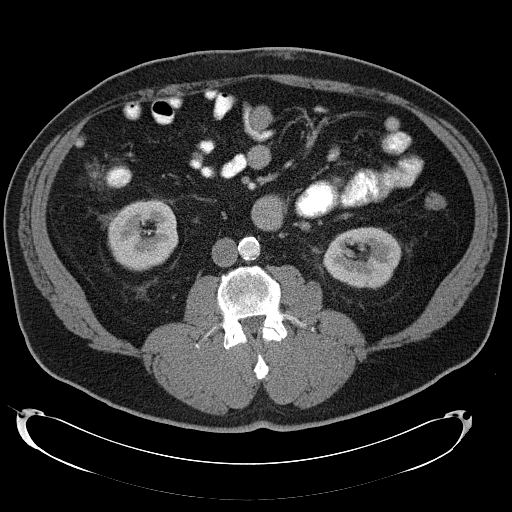
[im 67/140  soft-tissue]
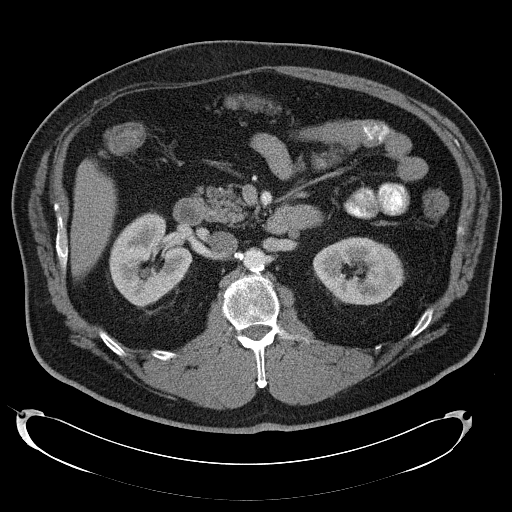
[im 73/140  soft-tissue]
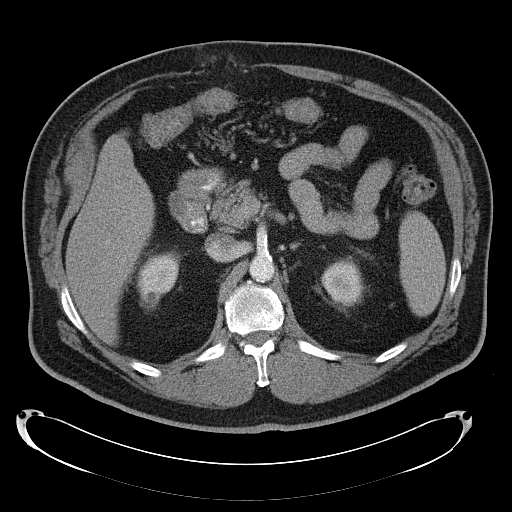
[im 84/140  soft-tissue]
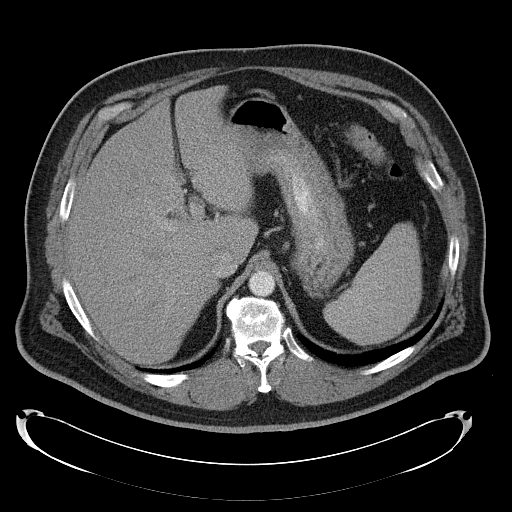
[im 84/140  bone]
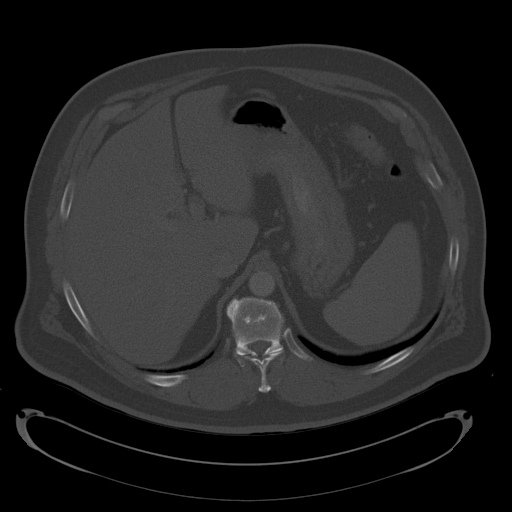
[im 95/140  soft-tissue]
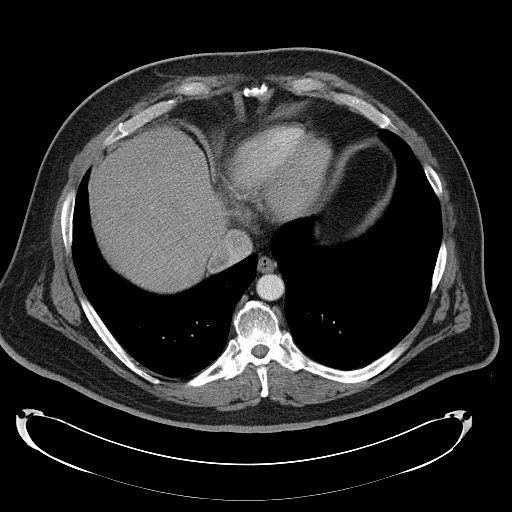
[im 106/140  soft-tissue]
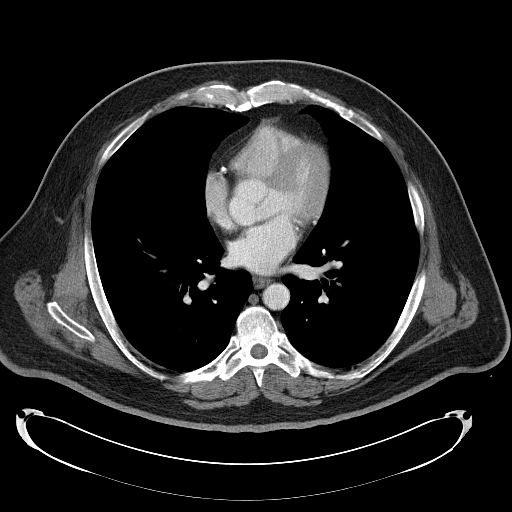
[im 112/140  soft-tissue]
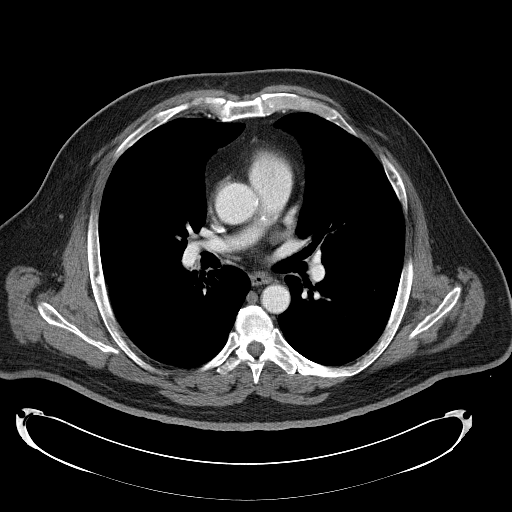
[im 123/140  soft-tissue]
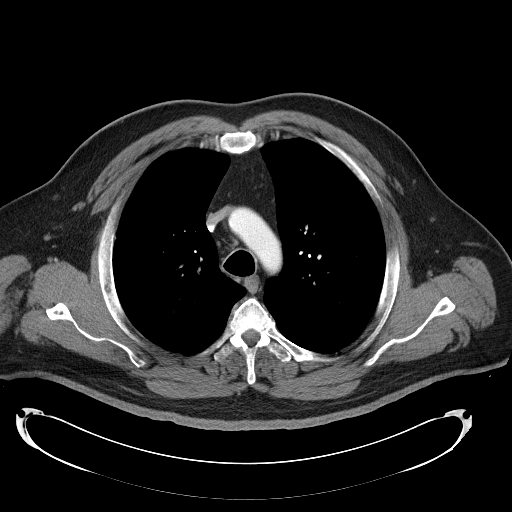
[im 134/140  soft-tissue]
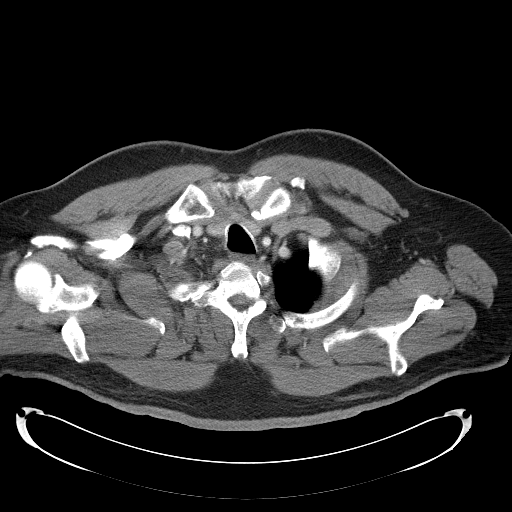

[Series 602: <mpr thick range> · coronal · 1.36mm/px · 3 of 124 slices shown]
[im 42/124  soft-tissue]
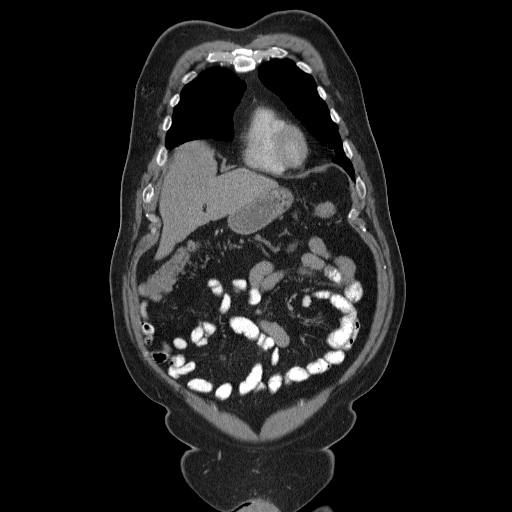
[im 55/124  soft-tissue]
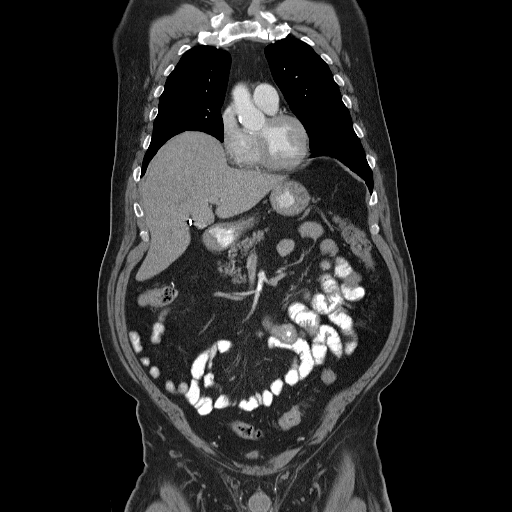
[im 69/124  soft-tissue]
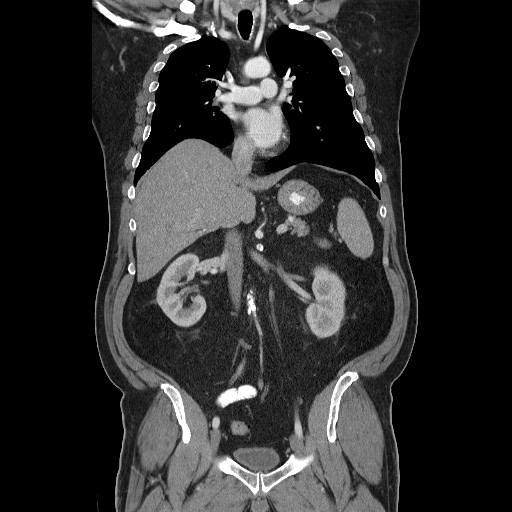

[17 of 46 positions shown; findings below may reference images not displayed]

FINDINGS: CT CHEST FINDINGS

No pathologically enlarged mediastinal, hilar or axillary lymph
nodes. Extensive 3 vessel coronary artery calcification. Heart size
normal. No pericardial effusion.

Mild scarring in the inferior lingula. Lungs are otherwise clear. No
pleural fluid. Airway is unremarkable.

CT ABDOMEN AND PELVIS FINDINGS

Liver is unremarkable. Cholecystectomy. Adrenal glands are
unremarkable. Low-attenuation lesions in the right kidney measure up
to 2.2 cm, unchanged and likely cysts. Left renal cortical scarring.
Spleen, pancreas, stomach and bowel are unremarkable. Prostate is
mildly enlarged and contains calcification. Question mild bladder
wall thickening. Atherosclerotic calcification of the arterial
vasculature without abdominal aortic aneurysm. No pathologically
enlarged lymph nodes. No free fluid. Upper abdominal ventral hernia
contains fat. No worrisome lytic or sclerotic lesions.
IMPRESSION: 1. No evidence of recurrent lymphoma.
2. Extensive 3 vessel coronary artery calcifications.
3. Mildly enlarged prostate. Question mild bladder wall thickening,
suggesting the possibility of an element of bladder outlet
obstruction.
4. Upper abdominal ventral hernia contains fat.

## 2014-07-17 ENCOUNTER — Other Ambulatory Visit (HOSPITAL_BASED_OUTPATIENT_CLINIC_OR_DEPARTMENT_OTHER): Payer: BC Managed Care – PPO

## 2014-07-17 ENCOUNTER — Ambulatory Visit (HOSPITAL_COMMUNITY)
Admission: RE | Admit: 2014-07-17 | Discharge: 2014-07-17 | Disposition: A | Discharge status: Home / Self Care | Payer: BC Managed Care – PPO | Source: Ambulatory Visit | Attending: Oncology | Admitting: Oncology

## 2014-07-17 DIAGNOSIS — C8590 Non-Hodgkin lymphoma, unspecified, unspecified site: Secondary | ICD-10-CM | POA: Diagnosis present

## 2014-07-17 DIAGNOSIS — Z9221 Personal history of antineoplastic chemotherapy: Secondary | ICD-10-CM | POA: Diagnosis not present

## 2014-07-17 DIAGNOSIS — N400 Enlarged prostate without lower urinary tract symptoms: Secondary | ICD-10-CM | POA: Insufficient documentation

## 2014-07-17 DIAGNOSIS — C8319 Mantle cell lymphoma, extranodal and solid organ sites: Secondary | ICD-10-CM

## 2014-07-17 DIAGNOSIS — I2510 Atherosclerotic heart disease of native coronary artery without angina pectoris: Secondary | ICD-10-CM | POA: Insufficient documentation

## 2014-07-17 LAB — CBC WITH DIFFERENTIAL/PLATELET
BASO%: 0.4 % (ref 0.0–2.0)
Basophils Absolute: 0 10*3/uL (ref 0.0–0.1)
EOS ABS: 0.3 10*3/uL (ref 0.0–0.5)
EOS%: 3.5 % (ref 0.0–7.0)
HCT: 47.5 % (ref 38.4–49.9)
HEMOGLOBIN: 16 g/dL (ref 13.0–17.1)
LYMPH#: 0.6 10*3/uL — AB (ref 0.9–3.3)
LYMPH%: 7.2 % — ABNORMAL LOW (ref 14.0–49.0)
MCH: 30.5 pg (ref 27.2–33.4)
MCHC: 33.7 g/dL (ref 32.0–36.0)
MCV: 90.6 fL (ref 79.3–98.0)
MONO#: 0.7 10*3/uL (ref 0.1–0.9)
MONO%: 8.3 % (ref 0.0–14.0)
NEUT%: 80.6 % — AB (ref 39.0–75.0)
NEUTROS ABS: 6.5 10*3/uL (ref 1.5–6.5)
Platelets: 178 10*3/uL (ref 140–400)
RBC: 5.24 10*6/uL (ref 4.20–5.82)
RDW: 14 % (ref 11.0–14.6)
WBC: 8 10*3/uL (ref 4.0–10.3)

## 2014-07-17 LAB — COMPREHENSIVE METABOLIC PANEL (CC13)
ALT: 22 U/L (ref 0–55)
ANION GAP: 13 meq/L — AB (ref 3–11)
AST: 24 U/L (ref 5–34)
Albumin: 4.2 g/dL (ref 3.5–5.0)
Alkaline Phosphatase: 123 U/L (ref 40–150)
BUN: 28.2 mg/dL — AB (ref 7.0–26.0)
CALCIUM: 9.5 mg/dL (ref 8.4–10.4)
CHLORIDE: 105 meq/L (ref 98–109)
CO2: 20 meq/L — AB (ref 22–29)
Creatinine: 1.2 mg/dL (ref 0.7–1.3)
EGFR: 66 mL/min/{1.73_m2} — ABNORMAL LOW (ref >90)
Glucose: 275 mg/dl — ABNORMAL HIGH (ref 70–140)
Potassium: 5 mEq/L (ref 3.5–5.1)
Sodium: 138 mEq/L (ref 136–145)
Total Bilirubin: 0.83 mg/dL (ref 0.20–1.20)
Total Protein: 7.1 g/dL (ref 6.4–8.3)

## 2014-07-17 MED ORDER — IOHEXOL 300 MG/ML  SOLN
100.0000 mL | Freq: Once | INTRAMUSCULAR | Status: AC | PRN
  Administered 2014-07-17: 100 mL via INTRAVENOUS

## 2014-07-19 ENCOUNTER — Ambulatory Visit: Payer: BC Managed Care – PPO | Admitting: Oncology

## 2015-01-29 ENCOUNTER — Telehealth: Payer: Self-pay | Admitting: Oncology

## 2015-01-29 ENCOUNTER — Other Ambulatory Visit: Payer: Self-pay | Admitting: Oncology

## 2015-01-29 DIAGNOSIS — C8590 Non-Hodgkin lymphoma, unspecified, unspecified site: Secondary | ICD-10-CM

## 2015-01-29 NOTE — Telephone Encounter (Signed)
s.w. pt and advised on JUNE appt.Marland KitchenMarland KitchenMarland KitchenMarland Kitchenpt ok adn aware

## 2015-02-01 ENCOUNTER — Ambulatory Visit (HOSPITAL_BASED_OUTPATIENT_CLINIC_OR_DEPARTMENT_OTHER): Payer: 59 | Admitting: Oncology

## 2015-02-01 ENCOUNTER — Telehealth: Payer: Self-pay | Admitting: Oncology

## 2015-02-01 ENCOUNTER — Other Ambulatory Visit (HOSPITAL_BASED_OUTPATIENT_CLINIC_OR_DEPARTMENT_OTHER): Payer: 59

## 2015-02-01 VITALS — BP 122/55 | HR 59 | Temp 97.7°F | Resp 18 | Ht 73.0 in | Wt 268.0 lb

## 2015-02-01 DIAGNOSIS — C8590 Non-Hodgkin lymphoma, unspecified, unspecified site: Secondary | ICD-10-CM | POA: Diagnosis not present

## 2015-02-01 DIAGNOSIS — D4989 Neoplasm of unspecified behavior of other specified sites: Secondary | ICD-10-CM

## 2015-02-01 LAB — CBC WITH DIFFERENTIAL/PLATELET
BASO%: 1.1 % (ref 0.0–2.0)
BASOS ABS: 0.1 10*3/uL (ref 0.0–0.1)
EOS%: 3.4 % (ref 0.0–7.0)
Eosinophils Absolute: 0.2 10*3/uL (ref 0.0–0.5)
HCT: 44.2 % (ref 38.4–49.9)
HGB: 14.6 g/dL (ref 13.0–17.1)
LYMPH%: 10.5 % — AB (ref 14.0–49.0)
MCH: 30.1 pg (ref 27.2–33.4)
MCHC: 33.1 g/dL (ref 32.0–36.0)
MCV: 91 fL (ref 79.3–98.0)
MONO#: 0.8 10*3/uL (ref 0.1–0.9)
MONO%: 11.5 % (ref 0.0–14.0)
NEUT#: 5.1 10*3/uL (ref 1.5–6.5)
NEUT%: 73.5 % (ref 39.0–75.0)
Platelets: 169 10*3/uL (ref 140–400)
RBC: 4.86 10*6/uL (ref 4.20–5.82)
RDW: 14 % (ref 11.0–14.6)
WBC: 6.9 10*3/uL (ref 4.0–10.3)
lymph#: 0.7 10*3/uL — ABNORMAL LOW (ref 0.9–3.3)

## 2015-02-01 LAB — COMPREHENSIVE METABOLIC PANEL (CC13)
ALBUMIN: 3.9 g/dL (ref 3.5–5.0)
ALK PHOS: 128 U/L (ref 40–150)
ALT: 21 U/L (ref 0–55)
AST: 22 U/L (ref 5–34)
Anion Gap: 7 mEq/L (ref 3–11)
BUN: 33.7 mg/dL — ABNORMAL HIGH (ref 7.0–26.0)
CALCIUM: 8.9 mg/dL (ref 8.4–10.4)
CHLORIDE: 106 meq/L (ref 98–109)
CO2: 22 mEq/L (ref 22–29)
Creatinine: 1.2 mg/dL (ref 0.7–1.3)
EGFR: 65 mL/min/{1.73_m2} — ABNORMAL LOW (ref >90)
Glucose: 305 mg/dl — ABNORMAL HIGH (ref 70–140)
POTASSIUM: 5.7 meq/L — AB (ref 3.5–5.1)
Sodium: 135 mEq/L — ABNORMAL LOW (ref 136–145)
Total Bilirubin: 0.66 mg/dL (ref 0.20–1.20)
Total Protein: 6.6 g/dL (ref 6.4–8.3)

## 2015-02-01 NOTE — Telephone Encounter (Signed)
Gave and printed appt sched and avs for pt Aug  °

## 2015-02-01 NOTE — Progress Notes (Signed)
Hematology and Oncology Follow Up Visit  Micheal Thompson [id number] [birth year] 64 y.o. 02/01/2015 10:02 AM   Principle Diagnosis: 64 year old gentleman  presented with splenomegaly and lymphadenopathy. His workup showed mantle cell lymphoma diagnosed in July of 2014.  Current therapy: Chemotherapy with  bendamustine and rituximab started on 02/28/2013. He is S/P 5 cycles completed in 07/2013.   Interim History: Micheal Thompson presents today for a followup visit. Since his visit, he continues to do very well. He did notice a growth on the anterior aspect of his left knee that was evaluated by an MRI. The results of this MRI is not available to me at this time but have noticed within the last few months. He did not report any growth or pain associated with that. Has not reported any constitutional symptoms of fevers or chills or sweats. His breathing has improved with decrease in the amount of productive cough.    He does not report any headaches or blurry vision or double vision. Does not report any syncope. Does not report any chest pain does report some occasional exertional dyspnea. Does not report any cough or hemoptysis. Report any nausea or vomiting or hematochezia. Does report some abdominal distention as mentioned. Does not report any frequency urgency or hesitancy. Does not report any lymphadenopathy or petechiae. His rest of review of systems unremarkable.  Medications: I have reviewed the patient's current medications.  Current Outpatient Prescriptions  Medication Sig Dispense Refill  . aspirin 81 MG tablet Take 81 mg by mouth daily.    Marland Kitchen atorvastatin (LIPITOR) 80 MG tablet Take 80 mg by mouth daily.    . B Complex-C (B-COMPLEX WITH VITAMIN C) tablet Take 1 tablet by mouth daily.    . ferrous sulfate 325 (65 FE) MG EC tablet Take 325 mg by mouth daily with breakfast.    . insulin regular human CONCENTRATED (HUMULIN R) 500 UNIT/ML SOLN injection Inject into the skin 3 (three) times daily with  meals. SSI    . lisinopril (PRINIVIL,ZESTRIL) 10 MG tablet Take 10 mg by mouth daily.    . Multiple Vitamins-Minerals (CENTRUM PO) Take by mouth daily.    . Omega-3 Fatty Acids (FISH OIL) 1000 MG CAPS Take by mouth daily.    . sertraline (ZOLOFT) 50 MG tablet Take 50 mg by mouth daily at 12 noon.     . traZODone (DESYREL) 100 MG tablet Take 100 mg by mouth at bedtime.     No current facility-administered medications for this visit.     Allergies:  Allergies  Allergen Reactions  . Penicillins Other (See Comments)    unknown    Past Medical History, Surgical history, Social history, and Family History were reviewed and updated.    Physical Exam: Blood pressure 122/55, pulse 59, temperature 97.7 F (36.5 C), temperature source Oral, resp. rate 18, height 6\' 1"  (1.854 m), weight 268 lb (121.564 kg). ECOG:1 General appearance: alert in any distress. Head: Normocephalic, without obvious abnormality, atraumatic Neck: no adenopathy Lymph nodes: Cervical, supraclavicular, and axillary nodes normal. Heart:regular rate and rhythm, S1, S2 normal, no murmur, click, rub or gallop Lung: Scattered expiratory wheezes and rhonchi noted on his examination. Abdomin: Soft, obese could not appreciate any splenomegaly. Good bowel sounds noted EXT:no erythema, induration. Slight protrusion noted on the anterior aspect of his left knee. He has full range of motion otherwise.   Impression and Plan:  64 year old gentleman with the following issues:  1. Non-Hodgkin's lymphoma presented with massive splenomegaly and extensive upper abdominal  lymphadenopathy with a diagnosis confirmed in July of 2014 is likely mantle cell lymphoma. He is status post 5 cycles of chemotherapy. His CT scan on December 2015 did not show any evidence to suggest recurrent disease. He does not have any clinical signs or symptoms of recurrence but certainly at risk of developing so. I plan on repeating imaging studies including a  PET scan in the next few weeks for staging purposes.  2. Growth on the medial aspect of his left knee: Unclear etiology but lymphoma recurrence is a possibility. He did have an MRI done which I will review with radiology and determine the next course of action. That could potentially include biopsy versus consideration of radiation therapy. As will be determined after we review these imaging studies.   3. Followup: In 2 months after his PET scan results.   [facility], MD 6/24/201610:02 AM

## 2015-03-05 ENCOUNTER — Encounter (HOSPITAL_COMMUNITY)
Admission: RE | Admit: 2015-03-05 | Discharge: 2015-03-05 | Disposition: A | Discharge status: Home / Self Care | Payer: 59 | Source: Ambulatory Visit | Attending: Oncology | Admitting: Oncology

## 2015-03-05 ENCOUNTER — Encounter (HOSPITAL_COMMUNITY): Payer: 59

## 2015-03-05 DIAGNOSIS — C8590 Non-Hodgkin lymphoma, unspecified, unspecified site: Secondary | ICD-10-CM | POA: Diagnosis not present

## 2015-03-05 LAB — GLUCOSE, CAPILLARY: Glucose-Capillary: 315 mg/dL — ABNORMAL HIGH (ref 65–99)

## 2015-03-07 ENCOUNTER — Encounter (HOSPITAL_COMMUNITY)
Admission: RE | Admit: 2015-03-07 | Discharge: 2015-03-07 | Disposition: A | Discharge status: Home / Self Care | Payer: 59 | Source: Ambulatory Visit | Attending: Oncology | Admitting: Oncology

## 2015-03-07 DIAGNOSIS — C8590 Non-Hodgkin lymphoma, unspecified, unspecified site: Secondary | ICD-10-CM | POA: Insufficient documentation

## 2015-03-07 LAB — GLUCOSE, CAPILLARY: Glucose-Capillary: 130 mg/dL — ABNORMAL HIGH (ref 65–99)

## 2015-03-07 MED ORDER — FLUDEOXYGLUCOSE F - 18 (FDG) INJECTION
12.3000 | Freq: Once | INTRAVENOUS | Status: AC | PRN
  Administered 2015-03-07: 12.3 via INTRAVENOUS

## 2015-03-14 ENCOUNTER — Other Ambulatory Visit (HOSPITAL_COMMUNITY): Payer: 59

## 2015-04-02 ENCOUNTER — Ambulatory Visit (HOSPITAL_BASED_OUTPATIENT_CLINIC_OR_DEPARTMENT_OTHER): Payer: 59 | Admitting: Oncology

## 2015-04-02 ENCOUNTER — Telehealth: Payer: Self-pay | Admitting: Oncology

## 2015-04-02 VITALS — BP 128/66 | HR 97 | Temp 97.7°F | Resp 18 | Ht 72.0 in | Wt 270.2 lb

## 2015-04-02 DIAGNOSIS — D4989 Neoplasm of unspecified behavior of other specified sites: Secondary | ICD-10-CM | POA: Diagnosis not present

## 2015-04-02 DIAGNOSIS — R140 Abdominal distension (gaseous): Secondary | ICD-10-CM | POA: Diagnosis not present

## 2015-04-02 DIAGNOSIS — C8590 Non-Hodgkin lymphoma, unspecified, unspecified site: Secondary | ICD-10-CM | POA: Diagnosis not present

## 2015-04-02 NOTE — Progress Notes (Signed)
Hematology and Oncology Follow Up Visit  Micheal Thompson [id number] 10/26/1950 63 y.o. 04/02/2015 10:19 AM   Principle Diagnosis: 64 year old gentleman  presented with splenomegaly and lymphadenopathy. His workup showed mantle cell lymphoma diagnosed in July of 2014.  Current therapy: Chemotherapy with  bendamustine and rituximab started on 02/28/2013. He is S/P 5 cycles completed in 07/2013.   Interim History: Micheal Thompson presents today for a followup visit. Since his visit, he reports no complaints. He does not report any lymphadenopathy or satiety. Has not reported any constitutional symptoms of fevers or chills or sweats. His breathing has improved with decrease in the amount of productive cough. He continues to perform activities of daily living without any decline.   He does not report any headaches or blurry vision or double vision. Does not report any syncope. Does not report any chest pain does report some occasional exertional dyspnea. Does not report any cough or hemoptysis. Report any nausea or vomiting or hematochezia. Does report some abdominal distention as mentioned. Does not report any frequency urgency or hesitancy. Does not report any lymphadenopathy or petechiae. His rest of review of systems unremarkable.  Medications: I have reviewed the patient's current medications.  Current Outpatient Prescriptions  Medication Sig Dispense Refill  . aspirin 81 MG tablet Take 81 mg by mouth daily.    Marland Kitchen atorvastatin (LIPITOR) 80 MG tablet Take 80 mg by mouth daily.    . B Complex-C (B-COMPLEX WITH VITAMIN C) tablet Take 1 tablet by mouth daily.    . ferrous sulfate 325 (65 FE) MG EC tablet Take 325 mg by mouth daily with breakfast.    . insulin regular human CONCENTRATED (HUMULIN R) 500 UNIT/ML SOLN injection Inject into the skin 3 (three) times daily with meals. SSI    . lisinopril (PRINIVIL,ZESTRIL) 10 MG tablet Take 10 mg by mouth daily.    . Multiple Vitamins-Minerals (CENTRUM PO) Take  by mouth daily.    . Omega-3 Fatty Acids (FISH OIL) 1000 MG CAPS Take by mouth daily.    . sertraline (ZOLOFT) 50 MG tablet Take 50 mg by mouth daily at 12 noon.     . traZODone (DESYREL) 100 MG tablet Take 100 mg by mouth at bedtime.     No current facility-administered medications for this visit.     Allergies:  Allergies  Allergen Reactions  . Penicillins Other (See Comments)    unknown    Past Medical History, Surgical history, Social history, and Family History were reviewed and updated.    Physical Exam: Blood pressure 128/66, pulse 97, temperature 97.7 F (36.5 C), temperature source Oral, resp. rate 18, height 6' (1.829 m), weight 270 lb 3.2 oz (122.562 kg), SpO2 98 %. ECOG:1 General appearance: alert in any distress. Head: Normocephalic, without obvious abnormality Neck: no adenopathy Lymph nodes: Cervical, supraclavicular, and axillary nodes normal. Heart:regular rate and rhythm, S1, S2 normal, no murmur, click, rub or gallop Lung: Scattered expiratory wheezes and rhonchi noted on his examination. Abdomin: Soft, obese could not appreciate any splenomegaly. Good bowel sounds noted EXT:no erythema, induration. Slight protrusion noted on the anterior aspect of his left knee.   CBC    Component Value Date/Time   WBC 6.9 02/01/2015 0934   WBC 16.2* 07/25/2013 0955   RBC 4.86 02/01/2015 0934   RBC 4.44 07/25/2013 0955   HGB 14.6 02/01/2015 0934   HGB 13.6 07/25/2013 0955   HCT 44.2 02/01/2015 0934   HCT 38.2* 07/25/2013 0955   PLT 169 02/01/2015 0934  PLT 218 07/25/2013 0955   MCV 91.0 02/01/2015 0934   MCV 86.0 07/25/2013 0955   MCH 30.1 02/01/2015 0934   MCH 30.6 07/25/2013 0955   MCHC 33.1 02/01/2015 0934   MCHC 35.6 07/25/2013 0955   RDW 14.0 02/01/2015 0934   RDW 13.5 07/25/2013 0955   LYMPHSABS 0.7* 02/01/2015 0934   LYMPHSABS 0.6* 07/25/2013 0955   MONOABS 0.8 02/01/2015 0934   MONOABS 0.7 07/25/2013 0955   EOSABS 0.2 02/01/2015 0934   EOSABS 0.2  07/25/2013 0955   BASOSABS 0.1 02/01/2015 0934   BASOSABS 0.0 07/25/2013 0955      Chemistry      Component Value Date/Time   NA 135* 02/01/2015 0934   NA 129* 07/25/2013 0955   K 5.7* 02/01/2015 0934   K 4.6 07/25/2013 0955   CL 93* 07/25/2013 0955   CO2 22 02/01/2015 0934   CO2 19 07/25/2013 0955   BUN 33.7* 02/01/2015 0934   BUN 23 07/25/2013 0955   CREATININE 1.2 02/01/2015 0934   CREATININE 0.95 07/25/2013 0955      Component Value Date/Time   CALCIUM 8.9 02/01/2015 0934   CALCIUM 8.9 07/25/2013 0955   ALKPHOS 128 02/01/2015 0934   ALKPHOS 182* 07/25/2013 0955   AST 22 02/01/2015 0934   AST 18 07/25/2013 0955   ALT 21 02/01/2015 0934   ALT 14 07/25/2013 0955   BILITOT 0.66 02/01/2015 0934   BILITOT 0.6 07/25/2013 0955     EXAM: NUCLEAR MEDICINE PET SKULL BASE TO THIGH  TECHNIQUE: 12.3 mCi F-18 FDG was injected intravenously. Full-ring PET imaging was performed from the skull base to thigh after the radiotracer. CT data was obtained and used for attenuation correction and anatomic localization.  FASTING BLOOD GLUCOSE: Value: 130 mg/dl  COMPARISON: CT of the chest, abdomen and pelvis 07/17/2014.  FINDINGS: NECK  No hypermetabolic lymph nodes in the neck.  CHEST  No hypermetabolic mediastinal or hilar nodes. No suspicious pulmonary nodules on the CT scan. Heart size is borderline enlarged. There is no significant pericardial fluid, thickening or pericardial calcification. There is atherosclerosis of the thoracic aorta, the great vessels of the mediastinum and the coronary arteries, including calcified atherosclerotic plaque in the left main, left anterior descending, left circumflex and right coronary arteries. Scarring in the inferior segment of the lingula. No acute consolidative airspace disease. No pleural effusions.  ABDOMEN/PELVIS  No abnormal hypermetabolic activity within the liver, pancreas, adrenal glands, or spleen. No  hypermetabolic lymph nodes in the abdomen or pelvis. Status post cholecystectomy. Exophytic low-attenuation lesion measuring 2.5 cm in the upper pole of the right kidney is incompletely characterized, but demonstrates no internal metabolic activity and was previously characterized as a simple cyst. No significant volume of ascites. No pneumoperitoneum. No pathologic distention of small bowel or colon. Several colonic diverticulae are noted in the descending colon and sigmoid colon, without surrounding inflammatory changes to suggest acute diverticulitis at this time. Small epigastric ventral hernia containing only omental fat incidentally noted. Status post appendectomy. Atherosclerosis throughout the abdominal and pelvic vasculature.  SKELETON  No focal hypermetabolic activity to suggest skeletal metastasis.  IMPRESSION: 1. No hypermetabolic lymphadenopathy in the neck, chest, abdomen or pelvis to suggest recurrent disease. 2. Spleen is normal in size and demonstrates no hypermetabolism. 3. Atherosclerosis, including left main and 3 vessel coronary artery disease. Please note that although the presence of coronary artery calcium documents the presence of coronary artery disease, the severity of this disease and any potential stenosis cannot be  assessed on this non-gated CT examination. Assessment for potential risk factor modification, dietary therapy or pharmacologic therapy may be warranted, if clinically indicated. 4. Small ventral hernia in the epigastric region containing only omental fat. No signs of associated bowel incarceration or obstruction at this time. 5. Mild colonic diverticulosis without evidence of acute diverticulitis at this time. 6. Additional incidental findings, as above.    Impression and Plan:  64 year old gentleman with the following issues:  1. Non-Hodgkin's lymphoma presented with massive splenomegaly and extensive upper abdominal  lymphadenopathy with a diagnosis confirmed in July of 2014 is likely mantle cell lymphoma. He is status post 5 cycles of chemotherapy. His CT scan on December 2015 did not show any evidence to suggest recurrent disease.   He is currently on active surveillance without any evidence of recurrent disease. PET/CT scan on 03/07/2015 was reviewed today and showed no evidence of metastasis. He continues to be in complete remission. The plan is to continue with the active surveillance with a clinical visit every 6 months and repeat imaging studies in 12 months.  2. Growth on the medial aspect of his left knee: Unclear etiology and unlikely related to lymphoma.  3. Followup: In 5 to 6  Months.   [facility], MD 8/23/201610:19 AM

## 2015-04-02 NOTE — Telephone Encounter (Signed)
per pof to sch pt appt-gave pt copy of avs °

## 2015-05-16 ENCOUNTER — Other Ambulatory Visit: Payer: Self-pay | Admitting: Internal Medicine

## 2015-05-16 NOTE — Telephone Encounter (Signed)
Rx(s) sent to pharmacy electronically.  

## 2015-05-21 ENCOUNTER — Encounter: Payer: Self-pay | Admitting: *Deleted

## 2015-05-21 NOTE — Progress Notes (Signed)
Patient calliing to request we move his appt for lab,CT and dr visit from 09-03-15 to this month, d/t he will be turning 71 and will be on medicare. He doesn't think medicare will pay for the visits. Lm on vm for linda davis in managed care to inquire if this is true.

## 2015-08-13 ENCOUNTER — Other Ambulatory Visit: Payer: Self-pay | Admitting: Internal Medicine

## 2015-09-03 ENCOUNTER — Other Ambulatory Visit (HOSPITAL_BASED_OUTPATIENT_CLINIC_OR_DEPARTMENT_OTHER): Payer: Medicare Other

## 2015-09-03 ENCOUNTER — Ambulatory Visit (HOSPITAL_BASED_OUTPATIENT_CLINIC_OR_DEPARTMENT_OTHER): Payer: Medicare Other | Admitting: Oncology

## 2015-09-03 ENCOUNTER — Telehealth: Payer: Self-pay | Admitting: Oncology

## 2015-09-03 VITALS — BP 144/73 | HR 89 | Temp 98.1°F | Resp 19 | Wt 272.6 lb

## 2015-09-03 DIAGNOSIS — Z8572 Personal history of non-Hodgkin lymphomas: Secondary | ICD-10-CM

## 2015-09-03 DIAGNOSIS — E119 Type 2 diabetes mellitus without complications: Secondary | ICD-10-CM

## 2015-09-03 DIAGNOSIS — R0609 Other forms of dyspnea: Secondary | ICD-10-CM

## 2015-09-03 DIAGNOSIS — C8317 Mantle cell lymphoma, spleen: Secondary | ICD-10-CM

## 2015-09-03 DIAGNOSIS — C8590 Non-Hodgkin lymphoma, unspecified, unspecified site: Secondary | ICD-10-CM

## 2015-09-03 LAB — COMPREHENSIVE METABOLIC PANEL
ALBUMIN: 4 g/dL (ref 3.5–5.0)
ALT: 21 U/L (ref 0–55)
AST: 24 U/L (ref 5–34)
Alkaline Phosphatase: 112 U/L (ref 40–150)
Anion Gap: 11 mEq/L (ref 3–11)
BUN: 25.3 mg/dL (ref 7.0–26.0)
CALCIUM: 9.8 mg/dL (ref 8.4–10.4)
CO2: 23 mEq/L (ref 22–29)
CREATININE: 1.2 mg/dL (ref 0.7–1.3)
Chloride: 106 mEq/L (ref 98–109)
EGFR: 63 mL/min/{1.73_m2} — ABNORMAL LOW (ref >90)
GLUCOSE: 241 mg/dL — AB (ref 70–140)
Potassium: 4.7 mEq/L (ref 3.5–5.1)
SODIUM: 140 meq/L (ref 136–145)
Total Bilirubin: 0.69 mg/dL (ref 0.20–1.20)
Total Protein: 7.2 g/dL (ref 6.4–8.3)

## 2015-09-03 LAB — CBC WITH DIFFERENTIAL/PLATELET
BASO%: 0.7 % (ref 0.0–2.0)
Basophils Absolute: 0.1 10*3/uL (ref 0.0–0.1)
EOS%: 2.4 % (ref 0.0–7.0)
Eosinophils Absolute: 0.2 10*3/uL (ref 0.0–0.5)
HCT: 51.5 % — ABNORMAL HIGH (ref 38.4–49.9)
HEMOGLOBIN: 16.9 g/dL (ref 13.0–17.1)
LYMPH#: 0.7 10*3/uL — AB (ref 0.9–3.3)
LYMPH%: 7.6 % — ABNORMAL LOW (ref 14.0–49.0)
MCH: 29.5 pg (ref 27.2–33.4)
MCHC: 32.8 g/dL (ref 32.0–36.0)
MCV: 90.1 fL (ref 79.3–98.0)
MONO#: 0.8 10*3/uL (ref 0.1–0.9)
MONO%: 8.7 % (ref 0.0–14.0)
NEUT%: 80.6 % — ABNORMAL HIGH (ref 39.0–75.0)
NEUTROS ABS: 7.2 10*3/uL — AB (ref 1.5–6.5)
Platelets: 161 10*3/uL (ref 140–400)
RBC: 5.71 10*6/uL (ref 4.20–5.82)
RDW: 14.6 % (ref 11.0–14.6)
WBC: 9 10*3/uL (ref 4.0–10.3)

## 2015-09-03 NOTE — Telephone Encounter (Signed)
per pof to sch pt appt-gave pt copy of avs °

## 2015-09-03 NOTE — Progress Notes (Signed)
Hematology and Oncology Follow Up Visit  Micheal Thompson [id number] [birth year] 65 y.o. 09/03/2015 10:29 AM   Principle Diagnosis: 65 year old gentleman  presented with splenomegaly and lymphadenopathy. His workup showed mantle cell lymphoma diagnosed in July of 2014.  Current therapy: Chemotherapy with  bendamustine and rituximab started on 02/28/2013. He is S/P 5 cycles completed in 07/2013.   Interim History: [name] presents today for a followup visit. Since his visit, he continues to do well without any major changes. He reports that his blood sugar have been under better control and currently on concentrated insulin preparation which have dropped his hemoglobin A1c down to 8. Otherwise he been feeling relatively healthy without any major complaints.   He does not report any lymphadenopathy or satiety. Has not reported any constitutional symptoms of fevers or chills or sweats. His breathing has improved with decrease in the amount of productive cough.   He does not report any headaches or blurry vision or double vision. Does not report any syncope. Does not report any chest pain does report some occasional exertional dyspnea. Does not report any cough or hemoptysis. Report any nausea or vomiting or hematochezia. Does report some abdominal distention as mentioned. Does not report any frequency urgency or hesitancy. Does not report any lymphadenopathy or petechiae. His rest of review of systems unremarkable.  Medications: I have reviewed the patient's current medications.  Current Outpatient Prescriptions  Medication Sig Dispense Refill  . aspirin 81 MG tablet Take 81 mg by mouth daily.    Marland Kitchen atorvastatin (LIPITOR) 80 MG tablet Take 80 mg by mouth daily.    . B Complex-C (B-COMPLEX WITH VITAMIN C) tablet Take 1 tablet by mouth daily.    . ferrous sulfate 325 (65 FE) MG EC tablet Take 325 mg by mouth daily with breakfast.    . insulin regular human CONCENTRATED (HUMULIN R) 500 UNIT/ML SOLN  injection Inject into the skin 3 (three) times daily with meals. SSI    . lisinopril (PRINIVIL,ZESTRIL) 10 MG tablet Take 10 mg by mouth daily.    . Multiple Vitamins-Minerals (CENTRUM PO) Take by mouth daily.    . Omega-3 Fatty Acids (FISH OIL) 1000 MG CAPS Take by mouth daily.    . sertraline (ZOLOFT) 50 MG tablet Take 50 mg by mouth daily at 12 noon.     . traZODone (DESYREL) 100 MG tablet Take 100 mg by mouth at bedtime.     No current facility-administered medications for this visit.     Allergies:  Allergies  Allergen Reactions  . Penicillins Other (See Comments)    unknown    Past Medical History, Surgical history, Social history, and Family History were reviewed and updated.    Physical Exam: Blood pressure 144/73, pulse 89, temperature 98.1 F (36.7 C), temperature source Oral, resp. rate 19, weight 272 lb 9.6 oz (123.651 kg), SpO2 96 %. ECOG:1 General appearance: alert for a gentleman without distress. Head: Normocephalic, without obvious abnormality shifting dullness or ascites. Neck: no adenopathy Lymph nodes: Cervical, supraclavicular, and axillary nodes normal. Heart:regular rate and rhythm, S1, S2 normal, no murmur, click, rub or gallop Lung: Scattered expiratory wheezes and rhonchi noted on his examination. Abdomin: Soft, obese could not appreciate any splenomegaly. Good bowel sounds noted EXT:no erythema, induration.   CBC    Component Value Date/Time   WBC 9.0 09/03/2015 1000   WBC 16.2* 07/25/2013 0955   RBC 5.71 09/03/2015 1000   RBC 4.44 07/25/2013 0955   HGB 16.9 09/03/2015 1000  HGB 13.6 07/25/2013 0955   HCT 51.5* 09/03/2015 1000   HCT 38.2* 07/25/2013 0955   PLT 161 09/03/2015 1000   PLT 218 07/25/2013 0955   MCV 90.1 09/03/2015 1000   MCV 86.0 07/25/2013 0955   MCH 29.5 09/03/2015 1000   MCH 30.6 07/25/2013 0955   MCHC 32.8 09/03/2015 1000   MCHC 35.6 07/25/2013 0955   RDW 14.6 09/03/2015 1000   RDW 13.5 07/25/2013 0955   LYMPHSABS  0.7* 09/03/2015 1000   LYMPHSABS 0.6* 07/25/2013 0955   MONOABS 0.8 09/03/2015 1000   MONOABS 0.7 07/25/2013 0955   EOSABS 0.2 09/03/2015 1000   EOSABS 0.2 07/25/2013 0955   BASOSABS 0.1 09/03/2015 1000   BASOSABS 0.0 07/25/2013 0955      Chemistry      Component Value Date/Time   NA 135* 02/01/2015 0934   NA 129* 07/25/2013 0955   K 5.7* 02/01/2015 0934   K 4.6 07/25/2013 0955   CL 93* 07/25/2013 0955   CO2 22 02/01/2015 0934   CO2 19 07/25/2013 0955   BUN 33.7* 02/01/2015 0934   BUN 23 07/25/2013 0955   CREATININE 1.2 02/01/2015 0934   CREATININE 0.95 07/25/2013 0955      Component Value Date/Time   CALCIUM 8.9 02/01/2015 0934   CALCIUM 8.9 07/25/2013 0955   ALKPHOS 128 02/01/2015 0934   ALKPHOS 182* 07/25/2013 0955   AST 22 02/01/2015 0934   AST 18 07/25/2013 0955   ALT 21 02/01/2015 0934   ALT 14 07/25/2013 0955   BILITOT 0.66 02/01/2015 0934   BILITOT 0.6 07/25/2013 0955     EXAM: NUCLEAR MEDICINE PET SKULL BASE TO THIGH  TECHNIQUE: 12.3 mCi F-18 FDG was injected intravenously. Full-ring PET imaging was performed from the skull base to thigh after the radiotracer. CT data was obtained and used for attenuation correction and anatomic localization.  FASTING BLOOD GLUCOSE: Value: 130 mg/dl  COMPARISON: CT of the chest, abdomen and pelvis 07/17/2014.  FINDINGS: NECK  No hypermetabolic lymph nodes in the neck.  CHEST  No hypermetabolic mediastinal or hilar nodes. No suspicious pulmonary nodules on the CT scan. Heart size is borderline enlarged. There is no significant pericardial fluid, thickening or pericardial calcification. There is atherosclerosis of the thoracic aorta, the great vessels of the mediastinum and the coronary arteries, including calcified atherosclerotic plaque in the left main, left anterior descending, left circumflex and right coronary arteries. Scarring in the inferior segment of the lingula. No acute consolidative  airspace disease. No pleural effusions.  ABDOMEN/PELVIS  No abnormal hypermetabolic activity within the liver, pancreas, adrenal glands, or spleen. No hypermetabolic lymph nodes in the abdomen or pelvis. Status post cholecystectomy. Exophytic low-attenuation lesion measuring 2.5 cm in the upper pole of the right kidney is incompletely characterized, but demonstrates no internal metabolic activity and was previously characterized as a simple cyst. No significant volume of ascites. No pneumoperitoneum. No pathologic distention of small bowel or colon. Several colonic diverticulae are noted in the descending colon and sigmoid colon, without surrounding inflammatory changes to suggest acute diverticulitis at this time. Small epigastric ventral hernia containing only omental fat incidentally noted. Status post appendectomy. Atherosclerosis throughout the abdominal and pelvic vasculature.  SKELETON  No focal hypermetabolic activity to suggest skeletal metastasis.  IMPRESSION: 1. No hypermetabolic lymphadenopathy in the neck, chest, abdomen or pelvis to suggest recurrent disease. 2. Spleen is normal in size and demonstrates no hypermetabolism. 3. Atherosclerosis, including left main and 3 vessel coronary artery disease. Please note that although  the presence of coronary artery calcium documents the presence of coronary artery disease, the severity of this disease and any potential stenosis cannot be assessed on this non-gated CT examination. Assessment for potential risk factor modification, dietary therapy or pharmacologic therapy may be warranted, if clinically indicated. 4. Small ventral hernia in the epigastric region containing only omental fat. No signs of associated bowel incarceration or obstruction at this time. 5. Mild colonic diverticulosis without evidence of acute diverticulitis at this time. 6. Additional incidental findings, as above.    Impression and  Plan:  65 year old gentleman with the following issues:  1. Non-Hodgkin's lymphoma presented with massive splenomegaly and extensive upper abdominal lymphadenopathy with a diagnosis confirmed in July of 2014 is likely mantle cell lymphoma. He is status post 5 cycles of chemotherapy. His CT scan on December 2015 did not show any evidence to suggest recurrent disease.   PET/CT scan on 03/07/2015 was reviewed again today today and showed no evidence of metastasis. The plan is to continue with active surveillance given the fact that he continues to be in remission. We will repeat imaging studies in 12-24 months or as needed.  2. Diabetes mellitus: Followed by Dr. Joylene Draft and seems to be under better control.  3. Followup: In 6  Months.   [facility], MD 1/24/201710:29 AM

## 2016-03-03 ENCOUNTER — Ambulatory Visit (HOSPITAL_BASED_OUTPATIENT_CLINIC_OR_DEPARTMENT_OTHER): Payer: Medicare Other | Admitting: Oncology

## 2016-03-03 ENCOUNTER — Other Ambulatory Visit (HOSPITAL_BASED_OUTPATIENT_CLINIC_OR_DEPARTMENT_OTHER): Payer: Medicare Other

## 2016-03-03 ENCOUNTER — Telehealth: Payer: Self-pay | Admitting: Oncology

## 2016-03-03 VITALS — BP 144/61 | HR 75 | Temp 97.8°F | Resp 18 | Ht 72.0 in | Wt 272.3 lb

## 2016-03-03 DIAGNOSIS — E119 Type 2 diabetes mellitus without complications: Secondary | ICD-10-CM | POA: Diagnosis not present

## 2016-03-03 DIAGNOSIS — C8317 Mantle cell lymphoma, spleen: Secondary | ICD-10-CM

## 2016-03-03 DIAGNOSIS — Z8572 Personal history of non-Hodgkin lymphomas: Secondary | ICD-10-CM

## 2016-03-03 LAB — COMPREHENSIVE METABOLIC PANEL
ALT: 18 U/L (ref 0–55)
ANION GAP: 10 meq/L (ref 3–11)
AST: 19 U/L (ref 5–34)
Albumin: 3.7 g/dL (ref 3.5–5.0)
Alkaline Phosphatase: 101 U/L (ref 40–150)
BUN: 20.6 mg/dL (ref 7.0–26.0)
CALCIUM: 8.4 mg/dL (ref 8.4–10.4)
CHLORIDE: 108 meq/L (ref 98–109)
CO2: 21 mEq/L — ABNORMAL LOW (ref 22–29)
Creatinine: 1.2 mg/dL (ref 0.7–1.3)
EGFR: 65 mL/min/{1.73_m2} — ABNORMAL LOW (ref >90)
Glucose: 285 mg/dl — ABNORMAL HIGH (ref 70–140)
POTASSIUM: 4.3 meq/L (ref 3.5–5.1)
Sodium: 139 mEq/L (ref 136–145)
Total Bilirubin: 0.6 mg/dL (ref 0.20–1.20)
Total Protein: 6.6 g/dL (ref 6.4–8.3)

## 2016-03-03 LAB — CBC WITH DIFFERENTIAL/PLATELET
BASO%: 0.7 % (ref 0.0–2.0)
BASOS ABS: 0.1 10*3/uL (ref 0.0–0.1)
EOS%: 1.9 % (ref 0.0–7.0)
Eosinophils Absolute: 0.1 10*3/uL (ref 0.0–0.5)
HEMATOCRIT: 48.8 % (ref 38.4–49.9)
HGB: 15.8 g/dL (ref 13.0–17.1)
LYMPH#: 0.6 10*3/uL — AB (ref 0.9–3.3)
LYMPH%: 7.2 % — ABNORMAL LOW (ref 14.0–49.0)
MCH: 29.4 pg (ref 27.2–33.4)
MCHC: 32.4 g/dL (ref 32.0–36.0)
MCV: 90.8 fL (ref 79.3–98.0)
MONO#: 0.7 10*3/uL (ref 0.1–0.9)
MONO%: 9.2 % (ref 0.0–14.0)
NEUT#: 6.4 10*3/uL (ref 1.5–6.5)
NEUT%: 81 % — AB (ref 39.0–75.0)
PLATELETS: 153 10*3/uL (ref 140–400)
RBC: 5.38 10*6/uL (ref 4.20–5.82)
RDW: 14 % (ref 11.0–14.6)
WBC: 7.9 10*3/uL (ref 4.0–10.3)

## 2016-03-03 NOTE — Telephone Encounter (Signed)
Gave pt cal & avs °

## 2016-03-03 NOTE — Progress Notes (Signed)
Hematology and Oncology Follow Up Visit  Micheal Thompson [id number] Feb 22, 1951 65 y.o. 03/03/2016 10:50 AM   Principle Diagnosis: 65 year old gentleman  presented with splenomegaly and lymphadenopathy. His workup showed mantle cell lymphoma diagnosed in July of 2014.  Current therapy: Chemotherapy with  bendamustine and rituximab started on 02/28/2013. He is S/P 5 cycles completed in 07/2013.   Interim History: Micheal Thompson presents today for a followup visit. Since his visit, he reports no major illnesses or complaints.He does not report any lymphadenopathy or satiety. Has not reported any constitutional symptoms of fevers or chills or sweats. His breathing has improved with decrease in the amount of productive cough. He has lost 3 pounds intentionally by cutting down on certain foods. He does report some occasional nausea but no vomiting. He continues to perform activities of daily living without any decline. He has a reasonable quality of life without any decline.   He does not report any headaches or blurry vision or double vision. Does not report any syncope. Does not report any chest pain does report some occasional exertional dyspnea. Does not report any cough or hemoptysis. Report any nausea or vomiting or hematochezia. Does report some abdominal distention as mentioned. Does not report any frequency urgency or hesitancy. Does not report any lymphadenopathy or petechiae. His rest of review of systems unremarkable.  Medications: I have reviewed the patient's current medications.  Current Outpatient Prescriptions  Medication Sig Dispense Refill  . aspirin 81 MG tablet Take 81 mg by mouth daily.    Marland Kitchen atorvastatin (LIPITOR) 80 MG tablet Take 80 mg by mouth daily.    . B Complex-C (B-COMPLEX WITH VITAMIN C) tablet Take 1 tablet by mouth daily.    . ferrous sulfate 325 (65 FE) MG EC tablet Take 325 mg by mouth daily with breakfast.    . insulin regular human CONCENTRATED (HUMULIN R) 500 UNIT/ML  SOLN injection Inject into the skin 3 (three) times daily with meals. SSI    . lisinopril (PRINIVIL,ZESTRIL) 10 MG tablet Take 10 mg by mouth daily.    . Multiple Vitamins-Minerals (CENTRUM PO) Take by mouth daily.    . Omega-3 Fatty Acids (FISH OIL) 1000 MG CAPS Take by mouth daily.    . sertraline (ZOLOFT) 50 MG tablet Take 50 mg by mouth daily at 12 noon.     . traZODone (DESYREL) 100 MG tablet Take 100 mg by mouth at bedtime.     No current facility-administered medications for this visit.      Allergies:  Allergies  Allergen Reactions  . Penicillins Other (See Comments)    unknown    Past Medical History, Surgical history, Social history, and Family History were reviewed and updated.    Physical Exam: Blood pressure (!) 144/61, pulse 75, temperature 97.8 F (36.6 C), temperature source Oral, resp. rate 18, height 6' (1.829 m), weight 272 lb 4.8 oz (123.5 kg), SpO2 97 %. ECOG:1 General appearance: Pleasant-appearing gentleman without distress. Head: Normocephalic, without obvious abnormality no scleral icterus. Neck: no adenopathy Lymph nodes: Cervical, supraclavicular, and axillary nodes normal. Heart:regular rate and rhythm, S1, S2 normal, no murmur, click, rub or gallop Lung: Scattered expiratory wheezes and rhonchi noted on his examination. Abdomin: Soft, obese could not appreciate any splenomegaly. No shifting dullness or ascites. EXT:no erythema, induration.   CBC    Component Value Date/Time   WBC 7.9 03/03/2016 1017   WBC 16.2 (H) 07/25/2013 0955   RBC 5.38 03/03/2016 1017   RBC 4.44 07/25/2013 0955  HGB 15.8 03/03/2016 1017   HCT 48.8 03/03/2016 1017   PLT 153 03/03/2016 1017   MCV 90.8 03/03/2016 1017   MCH 29.4 03/03/2016 1017   MCH 30.6 07/25/2013 0955   MCHC 32.4 03/03/2016 1017   MCHC 35.6 07/25/2013 0955   RDW 14.0 03/03/2016 1017   LYMPHSABS 0.6 (L) 03/03/2016 1017   MONOABS 0.7 03/03/2016 1017   EOSABS 0.1 03/03/2016 1017   BASOSABS 0.1  03/03/2016 1017      Chemistry      Component Value Date/Time   NA 140 09/03/2015 1000   K 4.7 09/03/2015 1000   CL 93 (L) 07/25/2013 0955   CO2 23 09/03/2015 1000   BUN 25.3 09/03/2015 1000   CREATININE 1.2 09/03/2015 1000      Component Value Date/Time   CALCIUM 9.8 09/03/2015 1000   ALKPHOS 112 09/03/2015 1000   AST 24 09/03/2015 1000   ALT 21 09/03/2015 1000   BILITOT 0.69 09/03/2015 1000      Impression and Plan:  65 year old gentleman with the following issues:  1. Non-Hodgkin's lymphoma presented with massive splenomegaly and extensive upper abdominal lymphadenopathy with a diagnosis confirmed in July of 2014 is likely mantle cell lymphoma. He is status post 5 cycles of chemotherapy.   PET/CT scan on 03/07/2015 showed no evidence of metastasis. His laboratory data and physical examination do not suggest any recurrent disease. The plan is to continue follow him clinically and repeat imaging studies he develops any specific symptoms. I anticipate routine imaging studies in July 2018 and sooner if needed to.  2. Diabetes mellitus: Followed by Dr. Joylene Draft and seems to be under better control.  3. Followup: In 6  Months.   [id number], MD 7/25/201710:50 AM

## 2016-07-20 ENCOUNTER — Ambulatory Visit: Payer: Self-pay | Admitting: Surgery

## 2016-07-20 NOTE — H&P (Signed)
History of Present Illness Micheal Thompson. Micheal Howes MD; 07/20/2016 11:41 AM) Patient words: EST Pt New prob.  The patient is a 65 year old male who presents with an incisional hernia. Referred by Dr. Joylene Draft for ventral incisional hernia Hem/Onc - [name]  This is a 65 year old male who was originally seen in June 2014 with massive splenomegaly as well as multiple enlarged intra-abdominal lymph nodes. He was diagnosed with mantle cell lymphoma and was treated with chemotherapy. He has been doing well. He had a PET scan in 2016 that showed no sign of recurrent or metastatic disease. He is having some difficulty with his blood sugars and his medications are being adjusted by his PCP. At the initial presentation in 2014, he was noted to have a small hernia and a right subcostal incision. He is status post open cholecystectomy in the mid 1980s. This hernia has enlarged and has become more uncomfortable. He denies any obstructive symptoms. He has not had any imaging since last year.   CLINICAL DATA: Subsequent treatment strategy for non-Hodgkin's lymphoma. Staging examination.  EXAM: NUCLEAR MEDICINE PET SKULL BASE TO THIGH  TECHNIQUE: 12.3 mCi F-18 FDG was injected intravenously. Full-ring PET imaging was performed from the skull base to thigh after the radiotracer. CT data was obtained and used for attenuation correction and anatomic localization.  FASTING BLOOD GLUCOSE: Value: 130 mg/dl  COMPARISON: CT of the chest, abdomen and pelvis 07/17/2014.  FINDINGS: NECK  No hypermetabolic lymph nodes in the neck.  CHEST  No hypermetabolic mediastinal or hilar nodes. No suspicious pulmonary nodules on the CT scan. Heart size is borderline enlarged. There is no significant pericardial fluid, thickening or pericardial calcification. There is atherosclerosis of the thoracic aorta, the great vessels of the mediastinum and the coronary arteries, including calcified atherosclerotic plaque in  the left main, left anterior descending, left circumflex and right coronary arteries. Scarring in the inferior segment of the lingula. No acute consolidative airspace disease. No pleural effusions.  ABDOMEN/PELVIS  No abnormal hypermetabolic activity within the liver, pancreas, adrenal glands, or spleen. No hypermetabolic lymph nodes in the abdomen or pelvis. Status post cholecystectomy. Exophytic low-attenuation lesion measuring 2.5 cm in the upper pole of the right kidney is incompletely characterized, but demonstrates no internal metabolic activity and was previously characterized as a simple cyst. No significant volume of ascites. No pneumoperitoneum. No pathologic distention of small bowel or colon. Several colonic diverticulae are noted in the descending colon and sigmoid colon, without surrounding inflammatory changes to suggest acute diverticulitis at this time. Small epigastric ventral hernia containing only omental fat incidentally noted. Status post appendectomy. Atherosclerosis throughout the abdominal and pelvic vasculature.  SKELETON  No focal hypermetabolic activity to suggest skeletal metastasis.  IMPRESSION: 1. No hypermetabolic lymphadenopathy in the neck, chest, abdomen or pelvis to suggest recurrent disease. 2. Spleen is normal in size and demonstrates no hypermetabolism. 3. Atherosclerosis, including left main and 3 vessel coronary artery disease. Please note that although the presence of coronary artery calcium documents the presence of coronary artery disease, the severity of this disease and any potential stenosis cannot be assessed on this non-gated CT examination. Assessment for potential risk factor modification, dietary therapy or pharmacologic therapy may be warranted, if clinically indicated. 4. Small ventral hernia in the epigastric region containing only omental fat. No signs of associated bowel incarceration or obstruction at this time. 5. Mild  colonic diverticulosis without evidence of acute diverticulitis at this time. 6. Additional incidental findings, as above.   Electronically Signed By:  Vinnie Langton M.D. On: 03/07/2015 10:28   Past Surgical History Yehuda Mao, RMA; 07/20/2016 11:12 AM) Appendectomy Colon Polyp Removal - Open Gallbladder Surgery - Open Open Inguinal Hernia Surgery Left. Tonsillectomy  Diagnostic Studies History Yehuda Mao, RMA; 07/20/2016 11:12 AM) Colonoscopy 5-10 years ago  Allergies Shirlean Mylar Gwynn, RMA; 07/20/2016 11:13 AM) Penicillin G Potassium *PENICILLINS*  Medication History Shirlean Mylar Gwynn, RMA; 07/20/2016 11:16 AM) Atorvastatin Calcium (80MG  Tablet, Oral) Active. HumuLIN R U-500 (CONCENTRATED) (500UNIT/ML Solution, Subcutaneous) Active. Jardiance (25MG  Tablet, Oral) Active. Lisinopril (10MG  Tablet, Oral) Active. Sertraline HCl (100MG  Tablet, Oral) Active. TraZODone HCl (100MG  Tablet, Oral) Active. Multi Vitamin Mens (Oral) Active. Aspirin (81MG  Tablet Chewable, Oral) Active. B Complex (Oral) Active. Ferrous Sulfate (325 (65 Fe)MG Tablet, Oral) Active. Fish Oil (1000MG  Capsule DR, Oral) Active. Medications Reconciled  Social History Yehuda Mao, RMA; 07/20/2016 11:12 AM) No alcohol use No caffeine use No drug use Tobacco use Former smoker.  Family History Yehuda Mao, RMA; 07/20/2016 11:12 AM) Alcohol Abuse Father. Diabetes Mellitus Mother.  Other Problems Yehuda Mao, RMA; 07/20/2016 11:12 AM) Anxiety Disorder Cholelithiasis Chronic Obstructive Lung Disease Diabetes Mellitus Gastroesophageal Reflux Disease High blood pressure Hypercholesterolemia Inguinal Hernia Pancreatitis     Review of Systems (Robin Gwynn RMA; 07/20/2016 11:12 AM) General Not Present- Appetite Loss, Chills, Fatigue, Fever, Night Sweats, Weight Gain and Weight Loss. Skin Present- Dryness and Non-Healing Wounds. Not Present- Change in Wart/Mole, Hives,  Jaundice, New Lesions, Rash and Ulcer. HEENT Present- Earache, Seasonal Allergies, Sinus Pain and Wears glasses/contact lenses. Not Present- Hearing Loss, Hoarseness, Nose Bleed, Oral Ulcers, Ringing in the Ears, Sore Throat, Visual Disturbances and Yellow Eyes. Respiratory Present- Chronic Cough. Not Present- Bloody sputum, Difficulty Breathing, Snoring and Wheezing. Breast Not Present- Breast Mass, Breast Pain, Nipple Discharge and Skin Changes. Cardiovascular Present- Leg Cramps. Not Present- Chest Pain, Difficulty Breathing Lying Down, Palpitations, Rapid Heart Rate, Shortness of Breath and Swelling of Extremities. Gastrointestinal Not Present- Abdominal Pain, Bloating, Bloody Stool, Change in Bowel Habits, Chronic diarrhea, Constipation, Difficulty Swallowing, Excessive gas, Gets full quickly at meals, Hemorrhoids, Indigestion, Nausea, Rectal Pain and Vomiting. Male Genitourinary Present- Change in Urinary Stream, Frequency, Nocturia, Urgency and Urine Leakage. Not Present- Blood in Urine, Impotence and Painful Urination.  Vitals (Robin Gwynn RMA; 07/20/2016 11:16 AM) 07/20/2016 11:16 AM Weight: 259 lb Height: 73in Body Surface Area: 2.4 m Body Mass Index: 34.17 kg/m  Pulse: 91 (Regular)  BP: 130/86 (Sitting, Left Arm, Standard)      Physical Exam Rodman Key K. Remberto Lienhard MD; 07/20/2016 11:42 AM)  The physical exam findings are as follows: Note:WDWN in NAD Eyes: Pupils equal, round; sclera anicteric HENT: Oral mucosa moist; good dentition Neck: No masses palpated, no thyromegaly Lungs: CTA bilaterally; normal respiratory effort CV: Regular rate and rhythm; no murmurs; extremities well-perfused with no edema Abd: +bowel sounds, soft, non-tender, no palpable organomegaly; healed Right subcostal incision with a hernia protruding along the medial 6 cm of the incision. Reducible when supine. It feels as if there are two adjacent hernias along his subcostal incision. Skin: Warm, dry;  no sign of jaundice Psychiatric - alert and oriented x 4; calm mood and affect    Assessment & Plan Rodman Key K. Viaan Knippenberg MD; 07/20/2016 11:43 AM)  VENTRAL INCISIONAL HERNIA WITHOUT OBSTRUCTION OR GANGRENE (K43.2)  Current Plans Schedule for Surgery - Laparoscopic ventral hernia repair with mesh. The surgical procedure has been discussed with the patient. Potential risks, benefits, alternative treatments, and expected outcomes have been explained. All of the patient's questions at this time  have been answered. The likelihood of reaching the patient's treatment goal is good. The patient understand the proposed surgical procedure and wishes to proceed. Note:The patient wants to try to get this scheduled before the end of the year, which may be difficult. We will see what we can do to get this scheduled within the next week.  Micheal Thompson. [name], MD, [facility] Surgery  General/ Trauma Surgery  07/20/2016 11:43 AM

## 2016-07-22 ENCOUNTER — Encounter (HOSPITAL_COMMUNITY): Payer: Self-pay | Admitting: *Deleted

## 2016-07-22 NOTE — Progress Notes (Signed)
Spoke with pt for pre-op call. Pt is a diabetic. States last A1C was 9.0 approx 2 months. Pt states his fasting blood sugar is usually between 77-129. Pt is on U500 insulin. He has just changed to a new PCP, had his medical records sent from his old PCP to his new PCP yesterday. Pt has not seen his new PCP yet. So he says he can't call his PCP to find out to adjust his insulin prior to surgery. He states that he does a sliding scale at night and will do that tonight and then I told him not to take his insulin in the AM but check his blood sugar when he gets up and every 2 hours until he leaves for the hospital. If blood sugar is 70 or below, treat with 1/2 cup of clear juice (apple or cranberry) and recheck blood sugar 15 minutes after drinking juice. If blood sugar continues to be 70 or below, call the Short Stay department and ask to speak to a nurse. Pt voiced understanding.

## 2016-07-23 ENCOUNTER — Encounter (HOSPITAL_COMMUNITY): Payer: Self-pay | Admitting: Urology

## 2016-07-23 ENCOUNTER — Ambulatory Visit (HOSPITAL_COMMUNITY): Payer: Medicare Other | Admitting: Anesthesiology

## 2016-07-23 ENCOUNTER — Inpatient Hospital Stay (HOSPITAL_COMMUNITY)
Admission: AD | Admit: 2016-07-23 | Discharge: 2016-07-24 | DRG: 337 | Disposition: A | Discharge status: Home / Self Care | Payer: Medicare Other | Source: Ambulatory Visit | Attending: Surgery | Admitting: Surgery

## 2016-07-23 ENCOUNTER — Encounter (HOSPITAL_COMMUNITY)
Admission: AD | Disposition: A | Discharge status: Home / Self Care | Payer: Self-pay | Source: Ambulatory Visit | Attending: Surgery

## 2016-07-23 DIAGNOSIS — Z9049 Acquired absence of other specified parts of digestive tract: Secondary | ICD-10-CM | POA: Diagnosis not present

## 2016-07-23 DIAGNOSIS — J449 Chronic obstructive pulmonary disease, unspecified: Secondary | ICD-10-CM | POA: Diagnosis present

## 2016-07-23 DIAGNOSIS — Z87891 Personal history of nicotine dependence: Secondary | ICD-10-CM

## 2016-07-23 DIAGNOSIS — E119 Type 2 diabetes mellitus without complications: Secondary | ICD-10-CM | POA: Diagnosis present

## 2016-07-23 DIAGNOSIS — K219 Gastro-esophageal reflux disease without esophagitis: Secondary | ICD-10-CM | POA: Diagnosis not present

## 2016-07-23 DIAGNOSIS — I10 Essential (primary) hypertension: Secondary | ICD-10-CM | POA: Diagnosis not present

## 2016-07-23 DIAGNOSIS — Z86010 Personal history of colonic polyps: Secondary | ICD-10-CM

## 2016-07-23 DIAGNOSIS — Z8572 Personal history of non-Hodgkin lymphomas: Secondary | ICD-10-CM

## 2016-07-23 DIAGNOSIS — K432 Incisional hernia without obstruction or gangrene: Principal | ICD-10-CM | POA: Diagnosis present

## 2016-07-23 DIAGNOSIS — Z9221 Personal history of antineoplastic chemotherapy: Secondary | ICD-10-CM

## 2016-07-23 DIAGNOSIS — K660 Peritoneal adhesions (postprocedural) (postinfection): Secondary | ICD-10-CM | POA: Diagnosis not present

## 2016-07-23 HISTORY — PX: INCISIONAL HERNIA REPAIR: SHX193

## 2016-07-23 HISTORY — DX: Pneumonia, unspecified organism: J18.9

## 2016-07-23 HISTORY — DX: Other specified cardiac arrhythmias: I49.8

## 2016-07-23 HISTORY — DX: Anemia, unspecified: D64.9

## 2016-07-23 LAB — BASIC METABOLIC PANEL
Anion gap: 10 (ref 5–15)
BUN: 14 mg/dL (ref 6–20)
CALCIUM: 8.4 mg/dL — AB (ref 8.9–10.3)
CHLORIDE: 110 mmol/L (ref 101–111)
CO2: 21 mmol/L — AB (ref 22–32)
CREATININE: 0.96 mg/dL (ref 0.61–1.24)
GFR calc non Af Amer: >60 mL/min (ref >60)
GLUCOSE: 156 mg/dL — AB (ref 65–99)
Potassium: 3.7 mmol/L (ref 3.5–5.1)
Sodium: 141 mmol/L (ref 135–145)

## 2016-07-23 LAB — CBC
HCT: 45.1 % (ref 39.0–52.0)
HCT: 44 % (ref 39.0–52.0)
HEMOGLOBIN: 14.7 g/dL (ref 13.0–17.0)
Hemoglobin: 15.1 g/dL (ref 13.0–17.0)
MCH: 29.3 pg (ref 26.0–34.0)
MCH: 29.7 pg (ref 26.0–34.0)
MCHC: 33.5 g/dL (ref 30.0–36.0)
MCHC: 33.4 g/dL (ref 30.0–36.0)
MCV: 87.6 fL (ref 78.0–100.0)
MCV: 88.9 fL (ref 78.0–100.0)
PLATELETS: 131 10*3/uL — AB (ref 150–400)
Platelets: 110 10*3/uL — ABNORMAL LOW (ref 150–400)
RBC: 5.15 MIL/uL (ref 4.22–5.81)
RBC: 4.95 MIL/uL (ref 4.22–5.81)
RDW: 14 % (ref 11.5–15.5)
RDW: 14.1 % (ref 11.5–15.5)
WBC: 6.9 10*3/uL (ref 4.0–10.5)
WBC: 8.2 10*3/uL (ref 4.0–10.5)

## 2016-07-23 LAB — GLUCOSE, CAPILLARY
GLUCOSE-CAPILLARY: 277 mg/dL — AB (ref 65–99)
GLUCOSE-CAPILLARY: 172 mg/dL — AB (ref 65–99)
GLUCOSE-CAPILLARY: 199 mg/dL — AB (ref 65–99)
Glucose-Capillary: 174 mg/dL — ABNORMAL HIGH (ref 65–99)
Glucose-Capillary: 136 mg/dL — ABNORMAL HIGH (ref 65–99)

## 2016-07-23 LAB — CREATININE, SERUM
CREATININE: 1.04 mg/dL (ref 0.61–1.24)
GFR calc Af Amer: >60 mL/min (ref >60)
GFR calc non Af Amer: >60 mL/min (ref >60)

## 2016-07-23 SURGERY — REPAIR, HERNIA, INCISIONAL, LAPAROSCOPIC
Anesthesia: General | Site: Abdomen

## 2016-07-23 MED ORDER — PROPOFOL 10 MG/ML IV BOLUS
INTRAVENOUS | Status: DC | PRN
  Administered 2016-07-23: 200 mg via INTRAVENOUS

## 2016-07-23 MED ORDER — TRAZODONE HCL 100 MG PO TABS
100.0000 mg | ORAL_TABLET | Freq: Every day | ORAL | Status: DC
  Administered 2016-07-23: 100 mg via ORAL
  Filled 2016-07-23: qty 1

## 2016-07-23 MED ORDER — ENOXAPARIN SODIUM 40 MG/0.4ML ~~LOC~~ SOLN
40.0000 mg | SUBCUTANEOUS | Status: DC
  Administered 2016-07-24: 40 mg via SUBCUTANEOUS
  Filled 2016-07-23: qty 0.4

## 2016-07-23 MED ORDER — GLYCOPYRROLATE 0.2 MG/ML IJ SOLN
INTRAMUSCULAR | Status: DC | PRN
  Administered 2016-07-23: .2 mg via INTRAVENOUS

## 2016-07-23 MED ORDER — METHOCARBAMOL 500 MG PO TABS
500.0000 mg | ORAL_TABLET | Freq: Four times a day (QID) | ORAL | Status: DC | PRN
  Filled 2016-07-23: qty 1

## 2016-07-23 MED ORDER — ROCURONIUM BROMIDE 50 MG/5ML IV SOSY
PREFILLED_SYRINGE | INTRAVENOUS | Status: AC
  Filled 2016-07-23: qty 10

## 2016-07-23 MED ORDER — INSULIN ASPART 100 UNIT/ML ~~LOC~~ SOLN: 0.0000 [IU] | Freq: Every day | SUBCUTANEOUS | Status: DC

## 2016-07-23 MED ORDER — ROCURONIUM BROMIDE 100 MG/10ML IV SOLN
INTRAVENOUS | Status: DC | PRN
  Administered 2016-07-23: 20 mg via INTRAVENOUS
  Administered 2016-07-23: 50 mg via INTRAVENOUS

## 2016-07-23 MED ORDER — ONDANSETRON HCL 4 MG/2ML IJ SOLN: 4.0000 mg | Freq: Four times a day (QID) | INTRAMUSCULAR | Status: DC | PRN

## 2016-07-23 MED ORDER — HYDROMORPHONE HCL 1 MG/ML IJ SOLN
INTRAMUSCULAR | Status: AC
  Filled 2016-07-23: qty 1

## 2016-07-23 MED ORDER — MORPHINE SULFATE (PF) 2 MG/ML IV SOLN: 2.0000 mg | INTRAVENOUS | Status: DC | PRN

## 2016-07-23 MED ORDER — HYDROMORPHONE HCL 1 MG/ML IJ SOLN
0.2500 mg | INTRAMUSCULAR | Status: DC | PRN
  Administered 2016-07-23 (×4): 0.5 mg via INTRAVENOUS

## 2016-07-23 MED ORDER — CHLORHEXIDINE GLUCONATE CLOTH 2 % EX PADS: 6.0000 | MEDICATED_PAD | Freq: Once | CUTANEOUS | Status: DC

## 2016-07-23 MED ORDER — ACETAMINOPHEN 650 MG RE SUPP: 650.0000 mg | Freq: Four times a day (QID) | RECTAL | Status: DC | PRN

## 2016-07-23 MED ORDER — INSULIN ASPART 100 UNIT/ML ~~LOC~~ SOLN
SUBCUTANEOUS | Status: AC
  Filled 2016-07-23: qty 1

## 2016-07-23 MED ORDER — SENNOSIDES-DOCUSATE SODIUM 8.6-50 MG PO TABS: 1.0000 | ORAL_TABLET | Freq: Every evening | ORAL | Status: DC | PRN

## 2016-07-23 MED ORDER — PROPOFOL 10 MG/ML IV BOLUS
INTRAVENOUS | Status: AC
  Filled 2016-07-23: qty 40

## 2016-07-23 MED ORDER — ONDANSETRON HCL 4 MG/2ML IJ SOLN
INTRAMUSCULAR | Status: DC | PRN
  Administered 2016-07-23: 4 mg via INTRAVENOUS

## 2016-07-23 MED ORDER — CANAGLIFLOZIN 100 MG PO TABS
100.0000 mg | ORAL_TABLET | Freq: Every day | ORAL | Status: DC
  Administered 2016-07-24: 100 mg via ORAL
  Filled 2016-07-23: qty 1

## 2016-07-23 MED ORDER — SUGAMMADEX SODIUM 500 MG/5ML IV SOLN
INTRAVENOUS | Status: AC
  Filled 2016-07-23: qty 5

## 2016-07-23 MED ORDER — OXYCODONE HCL 5 MG PO TABS
5.0000 mg | ORAL_TABLET | ORAL | Status: DC | PRN
  Administered 2016-07-23 – 2016-07-24 (×3): 10 mg via ORAL
  Filled 2016-07-23 (×3): qty 2

## 2016-07-23 MED ORDER — 0.9 % SODIUM CHLORIDE (POUR BTL) OPTIME
TOPICAL | Status: DC | PRN
  Administered 2016-07-23: 1000 mL

## 2016-07-23 MED ORDER — INSULIN ASPART 100 UNIT/ML ~~LOC~~ SOLN
0.0000 [IU] | Freq: Three times a day (TID) | SUBCUTANEOUS | Status: DC
  Administered 2016-07-23: 2 [IU] via SUBCUTANEOUS
  Administered 2016-07-24: 3 [IU] via SUBCUTANEOUS
  Administered 2016-07-24: 5 [IU] via SUBCUTANEOUS
  Filled 2016-07-23 (×25): qty 0.15

## 2016-07-23 MED ORDER — BUPIVACAINE-EPINEPHRINE 0.25% -1:200000 IJ SOLN
INTRAMUSCULAR | Status: DC | PRN
  Administered 2016-07-23: 30 mL

## 2016-07-23 MED ORDER — FENTANYL CITRATE (PF) 100 MCG/2ML IJ SOLN
INTRAMUSCULAR | Status: DC | PRN
  Administered 2016-07-23 (×2): 100 ug via INTRAVENOUS
  Administered 2016-07-23 (×2): 50 ug via INTRAVENOUS

## 2016-07-23 MED ORDER — KETOROLAC TROMETHAMINE 30 MG/ML IJ SOLN
30.0000 mg | Freq: Four times a day (QID) | INTRAMUSCULAR | Status: DC
  Administered 2016-07-23 – 2016-07-24 (×5): 30 mg via INTRAVENOUS
  Filled 2016-07-23 (×5): qty 1

## 2016-07-23 MED ORDER — PHENYLEPHRINE 40 MCG/ML (10ML) SYRINGE FOR IV PUSH (FOR BLOOD PRESSURE SUPPORT)
PREFILLED_SYRINGE | INTRAVENOUS | Status: AC
  Filled 2016-07-23: qty 10

## 2016-07-23 MED ORDER — OXYCODONE HCL 5 MG PO TABS
5.0000 mg | ORAL_TABLET | Freq: Once | ORAL | Status: AC | PRN
  Administered 2016-07-23: 5 mg via ORAL

## 2016-07-23 MED ORDER — KETOROLAC TROMETHAMINE 30 MG/ML IJ SOLN
INTRAMUSCULAR | Status: AC
  Filled 2016-07-23: qty 1

## 2016-07-23 MED ORDER — ONDANSETRON HCL 4 MG/2ML IJ SOLN
INTRAMUSCULAR | Status: AC
  Filled 2016-07-23: qty 2

## 2016-07-23 MED ORDER — INSULIN ASPART 100 UNIT/ML ~~LOC~~ SOLN
8.0000 [IU] | Freq: Once | SUBCUTANEOUS | Status: AC
  Administered 2016-07-23: 8 [IU] via SUBCUTANEOUS

## 2016-07-23 MED ORDER — SUCCINYLCHOLINE CHLORIDE 200 MG/10ML IV SOSY
PREFILLED_SYRINGE | INTRAVENOUS | Status: AC
  Filled 2016-07-23: qty 10

## 2016-07-23 MED ORDER — OXYCODONE HCL 5 MG PO TABS
ORAL_TABLET | ORAL | Status: AC
  Filled 2016-07-23: qty 1

## 2016-07-23 MED ORDER — LACTATED RINGERS IV SOLN
INTRAVENOUS | Status: DC
  Administered 2016-07-23: 08:00:00 via INTRAVENOUS

## 2016-07-23 MED ORDER — DIPHENHYDRAMINE HCL 12.5 MG/5ML PO ELIX: 12.5000 mg | ORAL_SOLUTION | Freq: Four times a day (QID) | ORAL | Status: DC | PRN

## 2016-07-23 MED ORDER — SODIUM CHLORIDE 0.9 % IV SOLN
INTRAVENOUS | Status: DC
  Administered 2016-07-23: 12:00:00 via INTRAVENOUS

## 2016-07-23 MED ORDER — INSULIN ASPART 100 UNIT/ML ~~LOC~~ SOLN
4.0000 [IU] | Freq: Three times a day (TID) | SUBCUTANEOUS | Status: DC
  Administered 2016-07-23 – 2016-07-24 (×3): 4 [IU] via SUBCUTANEOUS

## 2016-07-23 MED ORDER — LIDOCAINE 2% (20 MG/ML) 5 ML SYRINGE
INTRAMUSCULAR | Status: AC
  Filled 2016-07-23: qty 5

## 2016-07-23 MED ORDER — ACETAMINOPHEN 325 MG PO TABS
650.0000 mg | ORAL_TABLET | Freq: Four times a day (QID) | ORAL | Status: DC | PRN
Start: 2016-07-23 — End: 2016-07-24

## 2016-07-23 MED ORDER — SERTRALINE HCL 100 MG PO TABS
100.0000 mg | ORAL_TABLET | Freq: Every day | ORAL | Status: DC
  Administered 2016-07-23 – 2016-07-24 (×2): 100 mg via ORAL
  Filled 2016-07-23 (×2): qty 1

## 2016-07-23 MED ORDER — EPHEDRINE SULFATE 50 MG/ML IJ SOLN
INTRAMUSCULAR | Status: DC | PRN
  Administered 2016-07-23: 10 mg via INTRAVENOUS

## 2016-07-23 MED ORDER — FENTANYL CITRATE (PF) 100 MCG/2ML IJ SOLN
INTRAMUSCULAR | Status: AC
  Filled 2016-07-23: qty 4

## 2016-07-23 MED ORDER — CEFAZOLIN SODIUM 10 G IJ SOLR
3.0000 g | INTRAMUSCULAR | Status: AC
  Administered 2016-07-23: 3 g via INTRAVENOUS
  Filled 2016-07-23: qty 3000

## 2016-07-23 MED ORDER — SUGAMMADEX SODIUM 500 MG/5ML IV SOLN
INTRAVENOUS | Status: DC | PRN
  Administered 2016-07-23: 234 mg via INTRAVENOUS

## 2016-07-23 MED ORDER — EPHEDRINE 5 MG/ML INJ
INTRAVENOUS | Status: AC
Start: 2016-07-23 — End: 2016-07-23
  Filled 2016-07-23: qty 10

## 2016-07-23 MED ORDER — FENTANYL CITRATE (PF) 100 MCG/2ML IJ SOLN
INTRAMUSCULAR | Status: AC
  Filled 2016-07-23: qty 2

## 2016-07-23 MED ORDER — ONDANSETRON 4 MG PO TBDP: 4.0000 mg | ORAL_TABLET | Freq: Four times a day (QID) | ORAL | Status: DC | PRN

## 2016-07-23 MED ORDER — OXYCODONE HCL 5 MG/5ML PO SOLN: 5.0000 mg | Freq: Once | ORAL | Status: AC | PRN

## 2016-07-23 MED ORDER — MIDAZOLAM HCL 2 MG/2ML IJ SOLN
INTRAMUSCULAR | Status: AC
  Filled 2016-07-23: qty 2

## 2016-07-23 MED ORDER — ZOLPIDEM TARTRATE 5 MG PO TABS: 5.0000 mg | ORAL_TABLET | Freq: Every evening | ORAL | Status: DC | PRN

## 2016-07-23 MED ORDER — LIDOCAINE HCL (CARDIAC) 20 MG/ML IV SOLN
INTRAVENOUS | Status: DC | PRN
  Administered 2016-07-23: 50 mg via INTRAVENOUS

## 2016-07-23 MED ORDER — HYDRALAZINE HCL 20 MG/ML IJ SOLN: 10.0000 mg | INTRAMUSCULAR | Status: DC | PRN

## 2016-07-23 MED ORDER — CEFAZOLIN SODIUM-DEXTROSE 2-4 GM/100ML-% IV SOLN
2.0000 g | Freq: Three times a day (TID) | INTRAVENOUS | Status: AC
  Administered 2016-07-23: 2 g via INTRAVENOUS
  Filled 2016-07-23: qty 100

## 2016-07-23 MED ORDER — DIPHENHYDRAMINE HCL 50 MG/ML IJ SOLN: 12.5000 mg | Freq: Four times a day (QID) | INTRAMUSCULAR | Status: DC | PRN

## 2016-07-23 MED ORDER — ATORVASTATIN CALCIUM 80 MG PO TABS
80.0000 mg | ORAL_TABLET | Freq: Every evening | ORAL | Status: DC
  Administered 2016-07-23: 80 mg via ORAL
  Filled 2016-07-23: qty 1

## 2016-07-23 MED ORDER — LISINOPRIL 10 MG PO TABS
10.0000 mg | ORAL_TABLET | Freq: Every day | ORAL | Status: DC
  Administered 2016-07-23: 10 mg via ORAL
  Filled 2016-07-23: qty 1

## 2016-07-23 MED ORDER — BUPIVACAINE-EPINEPHRINE (PF) 0.25% -1:200000 IJ SOLN
INTRAMUSCULAR | Status: AC
  Filled 2016-07-23: qty 30

## 2016-07-23 MED ORDER — LACTATED RINGERS IV SOLN
INTRAVENOUS | Status: DC | PRN
  Administered 2016-07-23 (×3): via INTRAVENOUS

## 2016-07-23 SURGICAL SUPPLY — 57 items
ADH SKN CLS APL DERMABOND .7 (GAUZE/BANDAGES/DRESSINGS) ×2
APL SKNCLS STERI-STRIP NONHPOA (GAUZE/BANDAGES/DRESSINGS)
APPLIER CLIP 5 13 M/L LIGAMAX5 (MISCELLANEOUS)
APR CLP MED LRG 5 ANG JAW (MISCELLANEOUS)
BAG SPEC RTRVL LRG 6X4 10 (ENDOMECHANICALS) ×1
BENZOIN TINCTURE PRP APPL 2/3 (GAUZE/BANDAGES/DRESSINGS) ×1 IMPLANT
BINDER ABDOMINAL 12 ML 46-62 (SOFTGOODS) IMPLANT
BLADE SURG ROTATE 9660 (MISCELLANEOUS) ×1 IMPLANT
CANISTER SUCTION 2500CC (MISCELLANEOUS) IMPLANT
CHLORAPREP W/TINT 26ML (MISCELLANEOUS) ×2 IMPLANT
CLIP APPLIE 5 13 M/L LIGAMAX5 (MISCELLANEOUS) IMPLANT
COVER SURGICAL LIGHT HANDLE (MISCELLANEOUS) ×2 IMPLANT
DERMABOND ADVANCED (GAUZE/BANDAGES/DRESSINGS) ×2
DERMABOND ADVANCED .7 DNX12 (GAUZE/BANDAGES/DRESSINGS) IMPLANT
DEVICE SECURE STRAP 25 ABSORB (INSTRUMENTS) ×3 IMPLANT
DEVICE TROCAR PUNCTURE CLOSURE (ENDOMECHANICALS) ×2 IMPLANT
DRSG TEGADERM 2-3/8X2-3/4 SM (GAUZE/BANDAGES/DRESSINGS) IMPLANT
ELECT REM PT RETURN 9FT ADLT (ELECTROSURGICAL) ×2
ELECTRODE REM PT RTRN 9FT ADLT (ELECTROSURGICAL) ×1 IMPLANT
FILTER SMOKE EVAC LAPAROSHD (FILTER) IMPLANT
GAUZE SPONGE 2X2 8PLY STRL LF (GAUZE/BANDAGES/DRESSINGS) ×1 IMPLANT
GLOVE BIO SURGEON STRL SZ7 (GLOVE) ×2 IMPLANT
GLOVE BIOGEL PI IND STRL 6.5 (GLOVE) IMPLANT
GLOVE BIOGEL PI IND STRL 7.5 (GLOVE) ×3 IMPLANT
GLOVE BIOGEL PI INDICATOR 6.5 (GLOVE) ×2
GLOVE BIOGEL PI INDICATOR 7.5 (GLOVE) ×3
GLOVE ECLIPSE 6.5 STRL STRAW (GLOVE) ×2 IMPLANT
GLOVE SURG SS PI 6.0 STRL IVOR (GLOVE) ×2 IMPLANT
GLOVE SURG SS PI 7.5 STRL IVOR (GLOVE) ×4 IMPLANT
GOWN STRL REUS W/ TWL LRG LVL3 (GOWN DISPOSABLE) ×3 IMPLANT
GOWN STRL REUS W/TWL LRG LVL3 (GOWN DISPOSABLE) ×8
KIT BASIN OR (CUSTOM PROCEDURE TRAY) ×2 IMPLANT
KIT ROOM TURNOVER OR (KITS) ×2 IMPLANT
MARKER SKIN DUAL TIP RULER LAB (MISCELLANEOUS) ×2 IMPLANT
MESH VENTRALIGHT ST 6X8 (Mesh Specialty) ×2 IMPLANT
MESH VENTRLGHT ELLIPSE 8X6XMFL (Mesh Specialty) ×1 IMPLANT
NDL SPNL 22GX3.5 QUINCKE BK (NEEDLE) ×1 IMPLANT
NEEDLE SPNL 22GX3.5 QUINCKE BK (NEEDLE) ×2 IMPLANT
NS IRRIG 1000ML POUR BTL (IV SOLUTION) ×2 IMPLANT
PAD ARMBOARD 7.5X6 YLW CONV (MISCELLANEOUS) ×4 IMPLANT
POUCH SPECIMEN RETRIEVAL 10MM (ENDOMECHANICALS) ×1 IMPLANT
SCALPEL HARMONIC ACE (MISCELLANEOUS) ×1 IMPLANT
SCISSORS LAP 5X35 DISP (ENDOMECHANICALS) IMPLANT
SET IRRIG TUBING LAPAROSCOPIC (IRRIGATION / IRRIGATOR) IMPLANT
SLEEVE ENDOPATH XCEL 5M (ENDOMECHANICALS) ×2 IMPLANT
SPONGE GAUZE 2X2 STER 10/PKG (GAUZE/BANDAGES/DRESSINGS) ×1
STRIP CLOSURE SKIN 1/2X4 (GAUZE/BANDAGES/DRESSINGS) ×2 IMPLANT
SUT MNCRL AB 4-0 PS2 18 (SUTURE) ×2 IMPLANT
SUT NOVA NAB GS-21 0 18 T12 DT (SUTURE) ×3 IMPLANT
TOWEL OR 17X24 6PK STRL BLUE (TOWEL DISPOSABLE) ×2 IMPLANT
TOWEL OR 17X26 10 PK STRL BLUE (TOWEL DISPOSABLE) ×1 IMPLANT
TRAY FOLEY CATH 14FRSI W/METER (CATHETERS) IMPLANT
TRAY LAPAROSCOPIC MC (CUSTOM PROCEDURE TRAY) ×2 IMPLANT
TROCAR XCEL BLUNT TIP 100MML (ENDOMECHANICALS) IMPLANT
TROCAR XCEL NON-BLD 11X100MML (ENDOMECHANICALS) ×2 IMPLANT
TROCAR XCEL NON-BLD 5MMX100MML (ENDOMECHANICALS) ×2 IMPLANT
TUBING INSUFFLATION (TUBING) ×2 IMPLANT

## 2016-07-23 NOTE — Transfer of Care (Signed)
Immediate Anesthesia Transfer of Care Note  Patient: Micheal Thompson  Procedure(s) Performed: Procedure(s): LAPAROSCOPIC VENTRAL INCISIONAL HERNIA REPAIR WITH MESH (N/A)  Patient Location: PACU  Anesthesia Type:General  Level of Consciousness: awake, alert  and oriented  Airway & Oxygen Therapy: Patient Spontanous Breathing and Patient connected to nasal cannula oxygen  Post-op Assessment: Report given to RN and Post -op Vital signs reviewed and stable  Post vital signs: Reviewed and stable  Last Vitals:  Vitals:   07/23/16 0819  BP: 138/69  Pulse: 69  Resp: 20  Temp: 37.1 C    Last Pain:  Vitals:   07/23/16 0819  TempSrc: Oral         Complications: No apparent anesthesia complications

## 2016-07-23 NOTE — Anesthesia Preprocedure Evaluation (Signed)
Anesthesia Evaluation  Patient identified by MRN, date of birth, ID band Patient awake    Reviewed: Allergy & Precautions, H&P , NPO status , Patient's Chart, lab work & pertinent test results  Airway Mallampati: II   Neck ROM: full    Dental   Pulmonary COPD, former smoker,    breath sounds clear to auscultation       Cardiovascular hypertension,  Rhythm:regular Rate:Normal     Neuro/Psych Depression    GI/Hepatic GERD  ,  Endo/Other  diabetes, Type 2  Renal/GU      Musculoskeletal   Abdominal   Peds  Hematology   Anesthesia Other Findings   Reproductive/Obstetrics                             Anesthesia Physical Anesthesia Plan  ASA: II  Anesthesia Plan: General   Post-op Pain Management:    Induction: Intravenous  Airway Management Planned: Oral ETT  Additional Equipment:   Intra-op Plan:   Post-operative Plan: Extubation in OR  Informed Consent: I have reviewed the patients History and Physical, chart, labs and discussed the procedure including the risks, benefits and alternatives for the proposed anesthesia with the patient or authorized representative who has indicated his/her understanding and acceptance.     Plan Discussed with: CRNA, Anesthesiologist and Surgeon  Anesthesia Plan Comments:         Anesthesia Quick Evaluation

## 2016-07-23 NOTE — Anesthesia Procedure Notes (Signed)
Procedure Name: Intubation Date/Time: 07/23/2016 9:38 AM Performed by: Eligha Bridegroom Pre-anesthesia Checklist: Patient identified, Emergency Drugs available, Suction available, Patient being monitored and Timeout performed Patient Re-evaluated:Patient Re-evaluated prior to inductionOxygen Delivery Method: Circle system utilized Preoxygenation: Pre-oxygenation with 100% oxygen Intubation Type: IV induction and Rapid sequence Laryngoscope Size: Mac and 4 Grade View: Grade III Tube type: Oral Tube size: 8.0 mm Number of attempts: 1 Airway Equipment and Method: Bougie stylet Placement Confirmation: ETT inserted through vocal cords under direct vision,  positive ETCO2 and breath sounds checked- equal and bilateral Secured at: 23 cm Tube secured with: Tape Dental Injury: Teeth and Oropharynx as per pre-operative assessment  Difficulty Due To: Difficulty was anticipated and Difficult Airway- due to anterior larynx

## 2016-07-23 NOTE — Op Note (Signed)
Laparoscopic Ventral Hernia Repair Procedure Note  Indications: Symptomatic ventral hernia - right subcostal incision  Pre-operative Diagnosis: ventral incisional hernia  Post-operative Diagnosis: ventral incisional hernia  Surgeon: Shelitha Magley K.   Assistants: none  Anesthesia: General endotracheal anesthesia  ASA Class: 2  Procedure Details  The patient was seen in the Holding Room. The risks, benefits, complications, treatment options, and expected outcomes were discussed with the patient. The possibilities of reaction to medication, pulmonary aspiration, perforation of viscus, bleeding, recurrent infection, the need for additional procedures, failure to diagnose a condition, and creating a complication requiring transfusion or operation were discussed with the patient. The patient concurred with the proposed plan, giving informed consent.  The site of surgery properly noted/marked. The patient was taken to the operating room, identified as Micheal Thompson and the procedure verified as laparoscopic ventral hernia repair with mesh. A Time Out was held and the above information confirmed.    The patient was placed supine.  After establishing general anesthesia, a Foley catheter was placed.  The abdomen was prepped with Chloraprep and draped in standard fashion.  A 5 mm Optiview was used the cannulate the peritoneal cavity in the left upper quadrant below the costal margin.  Pneumoperitoneum was obtained by insufflating CO2, maintaining a maximum pressure of 15 mmHg.  The 5 mm 30-degree laparoscope was inserted.  There were significant omental adhesions to the anterior abdominal wall in the RUQ above the subcostal incision.  An 11-mm port was placed in the left anterior axillary line at the level of the umbilicus.  Another 5-mm port was placed in the left lower quadrant.  Harmonic scalpel and gentle traction were used to dissect the omental adhesions away from the anterior abdominal wall in the  RUQ.  We cleared the entire abdominal wall and were able to visualize two fascial defects. We used a spinal needle to identify the extent of the hernia defects.  This covered an area of about 7 x 10 cms.  We selected a 15 x 20 cm piece of Bard Ventralight mesh.  We placed eight stay sutures of 0 Novofil around the edges of the mesh.  The mesh was then rolled up and inserted through the 11 mm port site.  The mesh was then unrolled.  The stay sutures were then pulled up through small stab incisions using the Endo-close device.  This deployed the mesh widely over the fascial defects.  The stay sutures were then tied down.  The Secure Strap device was then used to tack down the edges of the mesh at 1 cm intervals circumferentially. We placed an additional 5 mm port site on the right to allow proper placement of the tacks. We placed a few tacks inside the outer ring of tacks.  We inspected for hemostasis.  The fascial defect at the 11 mm port site was closed with a 0 Vicryl using the Endoclose device.  Pneumoperitoneum was then released as we removed the remainder of the trocars.  The port sites were closed with 4-0 Monocryl.  All of the incisions and stay suture sites were then sealed with Dermabond.  An abdominal binder was placed around the patient's abdomen.  The patient was extubated and brought to the recovery room in stable condition.  All sponge, instrument, and needle counts were correct prior to closure and at the conclusion of the case.   Findings: Multiple defects in RUQ at subcostal incision  Estimated Blood Loss:  less than 50 mL  Complications:  None; patient tolerated the procedure well.         Disposition: PACU - hemodynamically stable.         Condition: stable  Micheal Thompson. Micheal Dover, MD, [facility] Surgery  General/ Trauma Surgery  07/23/2016 11:37 AM

## 2016-07-23 NOTE — H&P (View-Only) (Signed)
History of Present Illness Micheal Thompson. Micheal Lechuga MD; 07/20/2016 11:41 AM) Patient words: EST Pt New prob.  The patient is a 65 year old male who presents with an incisional hernia. Referred by Dr. Joylene Thompson for ventral incisional hernia Hem/Onc - Micheal Thompson  This is a 65 year old male who was originally seen in June 2014 with massive splenomegaly as well as multiple enlarged intra-abdominal lymph nodes. He was diagnosed with mantle cell lymphoma and was treated with chemotherapy. He has been doing well. He had a PET scan in 2016 that showed no sign of recurrent or metastatic disease. He is having some difficulty with his blood sugars and his medications are being adjusted by his PCP. At the initial presentation in 2014, he was noted to have a small hernia and a right subcostal incision. He is status post open cholecystectomy in the mid 1980s. This hernia has enlarged and has become more uncomfortable. He denies any obstructive symptoms. He has not had any imaging since last year.   CLINICAL DATA: Subsequent treatment strategy for non-Hodgkin's lymphoma. Staging examination.  EXAM: NUCLEAR MEDICINE PET SKULL BASE TO THIGH  TECHNIQUE: 12.3 mCi F-18 FDG was injected intravenously. Full-ring PET imaging was performed from the skull base to thigh after the radiotracer. CT data was obtained and used for attenuation correction and anatomic localization.  FASTING BLOOD GLUCOSE: Value: 130 mg/dl  COMPARISON: CT of the chest, abdomen and pelvis 07/17/2014.  FINDINGS: NECK  No hypermetabolic lymph nodes in the neck.  CHEST  No hypermetabolic mediastinal or hilar nodes. No suspicious pulmonary nodules on the CT scan. Heart size is borderline enlarged. There is no significant pericardial fluid, thickening or pericardial calcification. There is atherosclerosis of the thoracic aorta, the great vessels of the mediastinum and the coronary arteries, including calcified atherosclerotic plaque in  the left main, left anterior descending, left circumflex and right coronary arteries. Scarring in the inferior segment of the lingula. No acute consolidative airspace disease. No pleural effusions.  ABDOMEN/PELVIS  No abnormal hypermetabolic activity within the liver, pancreas, adrenal glands, or spleen. No hypermetabolic lymph nodes in the abdomen or pelvis. Status post cholecystectomy. Exophytic low-attenuation lesion measuring 2.5 cm in the upper pole of the right kidney is incompletely characterized, but demonstrates no internal metabolic activity and was previously characterized as a simple cyst. No significant volume of ascites. No pneumoperitoneum. No pathologic distention of small bowel or colon. Several colonic diverticulae are noted in the descending colon and sigmoid colon, without surrounding inflammatory changes to suggest acute diverticulitis at this time. Small epigastric ventral hernia containing only omental fat incidentally noted. Status post appendectomy. Atherosclerosis throughout the abdominal and pelvic vasculature.  SKELETON  No focal hypermetabolic activity to suggest skeletal metastasis.  IMPRESSION: 1. No hypermetabolic lymphadenopathy in the neck, chest, abdomen or pelvis to suggest recurrent disease. 2. Spleen is normal in size and demonstrates no hypermetabolism. 3. Atherosclerosis, including left main and 3 vessel coronary artery disease. Please note that although the presence of coronary artery calcium documents the presence of coronary artery disease, the severity of this disease and any potential stenosis cannot be assessed on this non-gated CT examination. Assessment for potential risk factor modification, dietary therapy or pharmacologic therapy may be warranted, if clinically indicated. 4. Small ventral hernia in the epigastric region containing only omental fat. No signs of associated bowel incarceration or obstruction at this time. 5. Mild  colonic diverticulosis without evidence of acute diverticulitis at this time. 6. Additional incidental findings, as above.   Electronically Signed By:  Micheal Thompson M.D. On: 03/07/2015 10:28   Past Surgical History Yehuda Mao, RMA; 07/20/2016 11:12 AM) Appendectomy Colon Polyp Removal - Open Gallbladder Surgery - Open Open Inguinal Hernia Surgery Left. Tonsillectomy  Diagnostic Studies History Yehuda Mao, RMA; 07/20/2016 11:12 AM) Colonoscopy 5-10 years ago  Allergies Shirlean Mylar Gwynn, RMA; 07/20/2016 11:13 AM) Penicillin G Potassium *PENICILLINS*  Medication History Shirlean Mylar Gwynn, RMA; 07/20/2016 11:16 AM) Atorvastatin Calcium (80MG  Tablet, Oral) Active. HumuLIN R U-500 (CONCENTRATED) (500UNIT/ML Solution, Subcutaneous) Active. Jardiance (25MG  Tablet, Oral) Active. Lisinopril (10MG  Tablet, Oral) Active. Sertraline HCl (100MG  Tablet, Oral) Active. TraZODone HCl (100MG  Tablet, Oral) Active. Multi Vitamin Mens (Oral) Active. Aspirin (81MG  Tablet Chewable, Oral) Active. B Complex (Oral) Active. Ferrous Sulfate (325 (65 Fe)MG Tablet, Oral) Active. Fish Oil (1000MG  Capsule DR, Oral) Active. Medications Reconciled  Social History Yehuda Mao, RMA; 07/20/2016 11:12 AM) No alcohol use No caffeine use No drug use Tobacco use Former smoker.  Family History Yehuda Mao, RMA; 07/20/2016 11:12 AM) Alcohol Abuse Father. Diabetes Mellitus Mother.  Other Problems Yehuda Mao, RMA; 07/20/2016 11:12 AM) Anxiety Disorder Cholelithiasis Chronic Obstructive Lung Disease Diabetes Mellitus Gastroesophageal Reflux Disease High blood pressure Hypercholesterolemia Inguinal Hernia Pancreatitis     Review of Systems (Robin Gwynn RMA; 07/20/2016 11:12 AM) General Not Present- Appetite Loss, Chills, Fatigue, Fever, Night Sweats, Weight Gain and Weight Loss. Skin Present- Dryness and Non-Healing Wounds. Not Present- Change in Wart/Mole, Hives,  Jaundice, New Lesions, Rash and Ulcer. HEENT Present- Earache, Seasonal Allergies, Sinus Pain and Wears glasses/contact lenses. Not Present- Hearing Loss, Hoarseness, Nose Bleed, Oral Ulcers, Ringing in the Ears, Sore Throat, Visual Disturbances and Yellow Eyes. Respiratory Present- Chronic Cough. Not Present- Bloody sputum, Difficulty Breathing, Snoring and Wheezing. Breast Not Present- Breast Mass, Breast Pain, Nipple Discharge and Skin Changes. Cardiovascular Present- Leg Cramps. Not Present- Chest Pain, Difficulty Breathing Lying Down, Palpitations, Rapid Heart Rate, Shortness of Breath and Swelling of Extremities. Gastrointestinal Not Present- Abdominal Pain, Bloating, Bloody Stool, Change in Bowel Habits, Chronic diarrhea, Constipation, Difficulty Swallowing, Excessive gas, Gets full quickly at meals, Hemorrhoids, Indigestion, Nausea, Rectal Pain and Vomiting. Male Genitourinary Present- Change in Urinary Stream, Frequency, Nocturia, Urgency and Urine Leakage. Not Present- Blood in Urine, Impotence and Painful Urination.  Vitals (Robin Gwynn RMA; 07/20/2016 11:16 AM) 07/20/2016 11:16 AM Weight: 259 lb Height: 73in Body Surface Area: 2.4 m Body Mass Index: 34.17 kg/m  Pulse: 91 (Regular)  BP: 130/86 (Sitting, Left Arm, Standard)      Physical Exam Rodman Key K. Mannie Ohlin MD; 07/20/2016 11:42 AM)  The physical exam findings are as follows: Note:WDWN in NAD Eyes: Pupils equal, round; sclera anicteric HENT: Oral mucosa moist; good dentition Neck: No masses palpated, no thyromegaly Lungs: CTA bilaterally; normal respiratory effort CV: Regular rate and rhythm; no murmurs; extremities well-perfused with no edema Abd: +bowel sounds, soft, non-tender, no palpable organomegaly; healed Right subcostal incision with a hernia protruding along the medial 6 cm of the incision. Reducible when supine. It feels as if there are two adjacent hernias along his subcostal incision. Skin: Warm, dry;  no sign of jaundice Psychiatric - alert and oriented x 4; calm mood and affect    Assessment & Plan Rodman Key K. Dorlis Judice MD; 07/20/2016 11:43 AM)  VENTRAL INCISIONAL HERNIA WITHOUT OBSTRUCTION OR GANGRENE (K43.2)  Current Plans Schedule for Surgery - Laparoscopic ventral hernia repair with mesh. The surgical procedure has been discussed with the patient. Potential risks, benefits, alternative treatments, and expected outcomes have been explained. All of the patient's questions at this time  have been answered. The likelihood of reaching the patient's treatment goal is good. The patient understand the proposed surgical procedure and wishes to proceed. Note:The patient wants to try to get this scheduled before the end of the year, which may be difficult. We will see what we can do to get this scheduled within the next week.  Micheal Thompson. [name], MD, [facility]  General/ Trauma Surgery  07/20/2016 11:43 AM

## 2016-07-23 NOTE — Interval H&P Note (Signed)
History and Physical Interval Note:  07/23/2016 7:26 AM  Micheal Thompson  has presented today for surgery, with the diagnosis of VENTRAL INCISIONAL HERNIA  The various methods of treatment have been discussed with the patient and family. After consideration of risks, benefits and other options for treatment, the patient has consented to  Procedure(s): [place] (N/A) as a surgical intervention .  The patient's history has been reviewed, patient examined, no change in status, stable for surgery.  I have reviewed the patient's chart and labs.  Questions were answered to the patient's satisfaction.     Rylon Poitra K.

## 2016-07-23 NOTE — Anesthesia Postprocedure Evaluation (Signed)
Anesthesia Post Note  Patient: Micheal Thompson  Procedure(s) Performed: Procedure(s) (LRB): LAPAROSCOPIC VENTRAL INCISIONAL HERNIA REPAIR WITH MESH (N/A)  Patient location during evaluation: PACU Anesthesia Type: General Level of consciousness: awake and alert and patient cooperative Pain management: pain level controlled Vital Signs Assessment: post-procedure vital signs reviewed and stable Respiratory status: spontaneous breathing and respiratory function stable Cardiovascular status: stable Anesthetic complications: no    Last Vitals:  Vitals:   07/23/16 1444 07/23/16 1547  BP: 125/70 113/64  Pulse: 75 75  Resp: 12 16  Temp: 36.5 C 37 C    Last Pain:  Vitals:   07/23/16 1547  TempSrc: Oral  PainSc:                  Parchment S

## 2016-07-24 ENCOUNTER — Encounter (HOSPITAL_COMMUNITY): Payer: Self-pay | Admitting: Surgery

## 2016-07-24 LAB — BASIC METABOLIC PANEL
Anion gap: 6 (ref 5–15)
BUN: 14 mg/dL (ref 6–20)
CHLORIDE: 107 mmol/L (ref 101–111)
CO2: 26 mmol/L (ref 22–32)
CREATININE: 1.12 mg/dL (ref 0.61–1.24)
Calcium: 8.2 mg/dL — ABNORMAL LOW (ref 8.9–10.3)
GFR calc Af Amer: >60 mL/min (ref >60)
GFR calc non Af Amer: >60 mL/min (ref >60)
Glucose, Bld: 162 mg/dL — ABNORMAL HIGH (ref 65–99)
POTASSIUM: 4.2 mmol/L (ref 3.5–5.1)
Sodium: 139 mmol/L (ref 135–145)

## 2016-07-24 LAB — CBC
HEMATOCRIT: 42.3 % (ref 39.0–52.0)
HEMOGLOBIN: 13.6 g/dL (ref 13.0–17.0)
MCH: 28.9 pg (ref 26.0–34.0)
MCHC: 32.2 g/dL (ref 30.0–36.0)
MCV: 89.8 fL (ref 78.0–100.0)
Platelets: 104 10*3/uL — ABNORMAL LOW (ref 150–400)
RBC: 4.71 MIL/uL (ref 4.22–5.81)
RDW: 14.1 % (ref 11.5–15.5)
WBC: 5.9 10*3/uL (ref 4.0–10.5)

## 2016-07-24 LAB — HEMOGLOBIN A1C
Hgb A1c MFr Bld: 10.7 % — ABNORMAL HIGH (ref 4.8–5.6)
Mean Plasma Glucose: 260 mg/dL

## 2016-07-24 LAB — GLUCOSE, CAPILLARY
Glucose-Capillary: 152 mg/dL — ABNORMAL HIGH (ref 65–99)
Glucose-Capillary: 248 mg/dL — ABNORMAL HIGH (ref 65–99)

## 2016-07-24 MED ORDER — OXYCODONE HCL 5 MG PO TABS: 5.0000 mg | ORAL_TABLET | ORAL | 0 refills | Status: AC | PRN

## 2016-07-24 NOTE — Discharge Summary (Signed)
Physician Discharge Summary  Patient ID: Micheal Thompson MRN: [id number] DOB/AGE: 1951-02-20 65 y.o.  Admit date: 07/23/2016 Discharge date: 07/24/2016  Admission Diagnoses:  Ventral incisional hernia  Discharge Diagnoses: same Active Problems:   Ventral incisional hernia   Discharged Condition: good  Hospital Course: Laparoscopic ventral incisional hernia repair with mesh on 07/23/16.  Did well overnight.  Ambulated on POD#1.  Voiding well.  Tolerating diet.  Pain controlled with PO pain meds  Consults: None  Treatments: lap ventral hernia repair with mesh.  Discharge Exam: Blood pressure (!) 115/57, pulse 60, temperature 97.9 F (36.6 C), temperature source Oral, resp. rate 18, height 6\' 1"  (1.854 m), weight 123.7 kg (272 lb 11.2 oz), SpO2 97 %. General appearance: alert, cooperative and no distress Resp: clear to auscultation bilaterally Cardio: regular rate and rhythm, S1, S2 normal, no murmur, click, rub or gallop GI: soft, tender along R subcostal incision Incisions c/d/i; no sign of hernia bulges  Disposition: 01-Home or Self Care   Allergies as of 07/24/2016      Reactions   No Known Allergies Other (See Comments)   Previously listed PCN Allergy not active.   Per Pt interview, "has taken a number of PCN derivatives" Taken Amoxicillin, Cephalosporins.      Medication List    TAKE these medications   aspirin 81 MG tablet Take 81 mg by mouth at bedtime.   atorvastatin 80 MG tablet Commonly known as:  LIPITOR Take 80 mg by mouth every evening.   B-complex with vitamin C tablet Take 1 tablet by mouth daily.   CENTRUM PO Take by mouth daily.   ferrous sulfate 325 (65 FE) MG EC tablet Take 325 mg by mouth daily with breakfast.   FISH OIL PO Take 1 tablet by mouth daily.   HUMULIN R 500 UNIT/ML injection Generic drug:  insulin regular human CONCENTRATED Inject 6-16 Units into the skin 3 (three) times daily with meals. 12/14:  Pt with vial & drawing up  in a U-100 syringe:  6 units (= 30 units)  in the morning, 16 units (= 80 units) lunch, 10 units (= 50 units) at night. May adjust the 10 unit (50 unit) night dose using sliding scale as needed.   JARDIANCE 25 MG Tabs tablet Generic drug:  empagliflozin Take 25 mg by mouth at bedtime.   lisinopril 10 MG tablet Commonly known as:  PRINIVIL,ZESTRIL Take 10 mg by mouth at bedtime.   oxyCODONE 5 MG immediate release tablet Commonly known as:  Oxy IR/ROXICODONE Take 1-2 tablets (5-10 mg total) by mouth every 4 (four) hours as needed for moderate pain.   sertraline 100 MG tablet Commonly known as:  ZOLOFT Take 100 mg by mouth daily.   traZODone 100 MG tablet Commonly known as:  DESYREL Take 100 mg by mouth at bedtime.      Follow-up Information    Micheal Petraglia K., MD Follow up in 3 week(s).   Specialty:  General Surgery Contact information: [address] [place] [address] [phone]           Signed: Maia Petties. 07/24/2016, 7:59 PM

## 2016-07-24 NOTE — Progress Notes (Signed)
1 Day Post-Op  Subjective: Sore, has not been out of bed yet No nausea or vomiting Using some IV dilaudid  Objective: Vital signs in last 24 hours: Temp:  [97.3 F (36.3 C)-98.7 F (37.1 C)] 97.5 F (36.4 C) (12/15 0635) Pulse Rate:  [62-96] 68 (12/15 0635) Resp:  [11-20] 16 (12/15 0635) BP: (106-143)/(53-98) 115/73 (12/15 0635) SpO2:  [92 %-98 %] 97 % (12/15 0635) Weight:  [117 kg (258 lb)-123.7 kg (272 lb 11.2 oz)] 123.7 kg (272 lb 11.2 oz) (12/14 1547) Last BM Date: 07/23/16  Intake/Output from previous day: 12/14 0701 - 12/15 0700 In: 5113.8 [P.O.:2280; I.V.:2833.8] Out: 1835 [Urine:1825; Blood:10] Intake/Output this shift: No intake/output data recorded.  General appearance: alert, cooperative and no distress Resp: clear to auscultation bilaterally Cardio: regular rate and rhythm, S1, S2 normal, no murmur, click, rub or gallop GI: soft, tender along R subcostal incision Incisions c/d/i; no sign of hernia bulges  Lab Results:   Recent Labs  07/23/16 1225 07/24/16 0617  WBC 8.2 5.9  HGB 14.7 13.6  HCT 44.0 42.3  PLT 110* 104*   BMET  Recent Labs  07/23/16 0754 07/23/16 1225 07/24/16 0617  NA 141  --  139  K 3.7  --  4.2  CL 110  --  107  CO2 21*  --  26  GLUCOSE 156*  --  162*  BUN 14  --  14  CREATININE 0.96 1.04 1.12  CALCIUM 8.4*  --  8.2*   PT/INR No results for input(s): LABPROT, INR in the last 72 hours. ABG No results for input(s): PHART, HCO3 in the last 72 hours.  Invalid input(s): PCO2, PO2  Studies/Results: No results found.  Anti-infectives: Anti-infectives    Start     Dose/Rate Route Frequency Ordered Stop   07/23/16 1500  ceFAZolin (ANCEF) IVPB 2g/100 mL premix     2 g 200 mL/hr over 30 Minutes Intravenous Every 8 hours 07/23/16 1459 07/23/16 1610   07/23/16 0828  ceFAZolin (ANCEF) 3 g in dextrose 5 % 50 mL IVPB     3 g 130 mL/hr over 30 Minutes Intravenous On call to O.R. 07/23/16 0828 07/23/16 0930       Assessment/Plan: s/p Procedure(s): LAPAROSCOPIC VENTRAL INCISIONAL HERNIA REPAIR WITH MESH (N/A) Advance diet Encourage ambulation  If patient able to tolerate pain with just PO pain meds, possible discharge later today.  LOS: 1 day    Tanvir Hipple K. 07/24/2016

## 2016-07-24 NOTE — Progress Notes (Signed)
Inpatient Diabetes Program Recommendations  AACE/ADA: New Consensus Statement on Inpatient Glycemic Control (2015)  Target Ranges:  Prepandial:   less than 140 mg/dL      Peak postprandial:   less than 180 mg/dL (1-2 hours)      Critically ill patients:  140 - 180 mg/dL   Lab Results  Component Value Date   GLUCAP 248 (H) 07/24/2016   HGBA1C 10.7 (H) 07/23/2016    Review of Glycemic Control  Results for TOM, FRIEDERS (MRN [id number]) as of 07/24/2016 12:47  Ref. Range 07/23/2016 14:19 07/23/2016 16:55 07/23/2016 21:46 07/24/2016 08:00 07/24/2016 11:45  Glucose-Capillary Latest Ref Range: 65 - 99 mg/dL 172 (H) 136 (H) 199 (H) 152 (H) 248 (H)    Diabetes history: Type 2 Outpatient Diabetes medications: U500 6 units in the am (equivalent to 30 units U100), 16 units at lunch (equivelent to 80 units U100) , 10 units (equivelent to 50 units U100) "at night"   Current orders for Inpatient glycemic control: Invokana 100mg , Novolog 0-15 units tid, Novolog 0-5 units, Novolog 4 units tid with meals.   Inpatient Diabetes Program Recommendations:  Consider adding Lantus 35 units qhs (~0.3units/kg) and increase his mealtime insulin to Novolog 6 units tid  Gentry Fitz, RN, [place], [place], CDE Diabetes Coordinator Inpatient Diabetes Program  [phone] (Team Pager) [phone] ([place]) 07/24/2016 1:01 PM

## 2016-07-24 NOTE — Progress Notes (Signed)
Patient discharged to home with instructions and prescription. 

## 2016-07-24 NOTE — Discharge Instructions (Signed)
[facility], [place] [id number]  ABDOMINAL SURGERY: POST OP INSTRUCTIONS  Always review your discharge instruction sheet given to you by the facility where your surgery was performed.  IF YOU HAVE DISABILITY OR FAMILY LEAVE FORMS, YOU MUST BRING THEM TO THE OFFICE FOR PROCESSING.  PLEASE DO NOT GIVE THEM TO YOUR DOCTOR.  1. A prescription for pain medication may be given to you upon discharge.  Take your pain medication as prescribed, if needed.  If narcotic pain medicine is not needed, then you may take acetaminophen (Tylenol) or ibuprofen (Advil) as needed. 2. Take your usually prescribed medications unless otherwise directed. 3. If you need a refill on your pain medication, please contact your pharmacy. They will contact our office to request authorization.  Prescriptions will not be filled after 5pm or on week-ends. 4. You should follow a light diet the first few days after arrival home, such as soup and crackers, pudding, etc.unless your doctor has advised otherwise. A high-fiber, low fat diet can be resumed as tolerated.   Be sure to include lots of fluids daily. Most patients will experience some swelling and bruising around the incisions.  Ice packs will help.  Swelling and bruising can take several days to resolve 5. It is common to experience some constipation if taking pain medication after surgery.  Increasing fluid intake and taking a stool softener will usually help or prevent this problem from occurring.  A mild laxative (Milk of Magnesia or Miralax) should be taken according to package directions if there are no bowel movements after 48 hours. 6.   If your surgeon used skin glue on the incision, you may shower in 24 hours.  The glue will flake off over the next 2-3 weeks.   7. ACTIVITIES:  You may resume regular (light) daily activities beginning the next day--such as daily self-care, walking, climbing stairs--gradually increasing activities as tolerated.  You may  have sexual intercourse when it is comfortable.  Refrain from any heavy lifting or straining until approved by your doctor. a. You may drive when you no longer are taking prescription pain medication, you can comfortably wear a seatbelt, and you can safely maneuver your car and apply brakes b. Return to Work: ___________________________________ 8. You should see your doctor in the office for a follow-up appointment approximately two weeks after your surgery.  Make sure that you call for this appointment within a day or two after you arrive home to insure a convenient appointment time. OTHER INSTRUCTIONS:  _____________________________________________________________ _____________________________________________________________  WHEN TO CALL YOUR DOCTOR: 1. Fever over 101.0 2. Inability to urinate 3. Nausea and/or vomiting 4. Extreme swelling or bruising 5. Continued bleeding from incision. 6. Increased pain, redness, or drainage from the incision. 7. Difficulty swallowing or breathing 8. Muscle cramping or spasms. 9. Numbness or tingling in hands or feet or around lips.  The clinic staff is available to answer your questions during regular business hours.  Please dont hesitate to call and ask to speak to one of the nurses if you have concerns.  For further questions, please visit [email or web address]

## 2016-08-26 ENCOUNTER — Other Ambulatory Visit: Payer: Self-pay | Admitting: Nurse Practitioner

## 2016-09-01 ENCOUNTER — Telehealth: Payer: Self-pay | Admitting: Oncology

## 2016-09-01 NOTE — Telephone Encounter (Signed)
Patient called and would like to change his Thursday appointment to another day.  Call back is [phone]

## 2016-09-01 NOTE — Telephone Encounter (Signed)
Pt called to r/s 1/25 appt to next week. Gave pt new appt date/time

## 2016-09-03 ENCOUNTER — Ambulatory Visit: Payer: Medicare Other | Admitting: Oncology

## 2016-09-03 ENCOUNTER — Other Ambulatory Visit: Payer: Medicare Other

## 2016-09-08 ENCOUNTER — Other Ambulatory Visit (HOSPITAL_BASED_OUTPATIENT_CLINIC_OR_DEPARTMENT_OTHER): Payer: Medicare HMO

## 2016-09-08 ENCOUNTER — Ambulatory Visit (HOSPITAL_BASED_OUTPATIENT_CLINIC_OR_DEPARTMENT_OTHER): Payer: Medicare HMO | Admitting: Oncology

## 2016-09-08 ENCOUNTER — Telehealth: Payer: Self-pay | Admitting: Oncology

## 2016-09-08 VITALS — BP 146/54 | HR 80 | Temp 97.9°F | Resp 18 | Ht 73.0 in | Wt 275.2 lb

## 2016-09-08 DIAGNOSIS — C8597 Non-Hodgkin lymphoma, unspecified, spleen: Secondary | ICD-10-CM

## 2016-09-08 DIAGNOSIS — E119 Type 2 diabetes mellitus without complications: Secondary | ICD-10-CM

## 2016-09-08 DIAGNOSIS — C8317 Mantle cell lymphoma, spleen: Secondary | ICD-10-CM

## 2016-09-08 LAB — COMPREHENSIVE METABOLIC PANEL
ALT: 19 U/L (ref 0–55)
ANION GAP: 8 meq/L (ref 3–11)
AST: 19 U/L (ref 5–34)
Albumin: 3.7 g/dL (ref 3.5–5.0)
Alkaline Phosphatase: 148 U/L (ref 40–150)
BUN: 17.4 mg/dL (ref 7.0–26.0)
CALCIUM: 8.6 mg/dL (ref 8.4–10.4)
CHLORIDE: 105 meq/L (ref 98–109)
CO2: 29 meq/L (ref 22–29)
Creatinine: 1.2 mg/dL (ref 0.7–1.3)
EGFR: 61 mL/min/{1.73_m2} — AB (ref >90)
Glucose: 333 mg/dl — ABNORMAL HIGH (ref 70–140)
Potassium: 4.9 mEq/L (ref 3.5–5.1)
Sodium: 142 mEq/L (ref 136–145)
Total Bilirubin: 0.63 mg/dL (ref 0.20–1.20)
Total Protein: 6.4 g/dL (ref 6.4–8.3)

## 2016-09-08 LAB — CBC WITH DIFFERENTIAL/PLATELET
BASO%: 0.4 % (ref 0.0–2.0)
BASOS ABS: 0 10*3/uL (ref 0.0–0.1)
EOS ABS: 0.1 10*3/uL (ref 0.0–0.5)
EOS%: 1.9 % (ref 0.0–7.0)
HCT: 43.4 % (ref 38.4–49.9)
HGB: 14.2 g/dL (ref 13.0–17.1)
LYMPH%: 25 % (ref 14.0–49.0)
MCH: 28.7 pg (ref 27.2–33.4)
MCHC: 32.6 g/dL (ref 32.0–36.0)
MCV: 87.8 fL (ref 79.3–98.0)
MONO#: 0.4 10*3/uL (ref 0.1–0.9)
MONO%: 5.6 % (ref 0.0–14.0)
NEUT#: 4.7 10*3/uL (ref 1.5–6.5)
NEUT%: 67.1 % (ref 39.0–75.0)
Platelets: 133 10*3/uL — ABNORMAL LOW (ref 140–400)
RBC: 4.94 10*6/uL (ref 4.20–5.82)
RDW: 14.7 % — ABNORMAL HIGH (ref 11.0–14.6)
WBC: 7 10*3/uL (ref 4.0–10.3)
lymph#: 1.8 10*3/uL (ref 0.9–3.3)

## 2016-09-08 LAB — LACTATE DEHYDROGENASE: LDH: 146 U/L (ref 125–245)

## 2016-09-08 NOTE — Telephone Encounter (Signed)
Gave patient avs report and appointments for August.  °

## 2016-09-08 NOTE — Progress Notes (Signed)
Hematology and Oncology Follow Up Visit  Micheal Thompson [id number] February 25, 1951 66 y.o. 09/08/2016 2:44 PM   Principle Diagnosis: 66 year old gentleman  presented with splenomegaly and lymphadenopathy. His workup showed mantle cell lymphoma diagnosed in July of 2014. He presented with lymphocytosis.  Prior therapy: Chemotherapy with  bendamustine and rituximab started on 02/28/2013. He is S/P 5 cycles completed in 07/2013. He achieved complete response at that time.  Current therapy: Observation and surveillance.  Interim History: Micheal Thompson presents today for a followup visit. Since his visit, he underwent a hernia repair which she tolerated very well in December 2017. He denied any residual complications related to that.He does not report any lymphadenopathy or satiety. Has not reported any constitutional symptoms of fevers or chills or sweats. He continues to perform activities of daily living without any decline. He has a reasonable quality of life without any decline. He does report very mild dyspnea on exertion which has not changed dramatically.   He does not report any headaches or blurry vision or double vision. Does not report any syncope. Does not report any chest pain does report some occasional exertional dyspnea. Does not report any cough or hemoptysis. Report any nausea or vomiting or hematochezia. Does report some abdominal distention as mentioned. Does not report any frequency urgency or hesitancy. Does not report any lymphadenopathy or petechiae. His rest of review of systems unremarkable.  Medications: I have reviewed the patient's current medications.  Current Outpatient Prescriptions  Medication Sig Dispense Refill  . aspirin 81 MG tablet Take 81 mg by mouth at bedtime.     Marland Kitchen atorvastatin (LIPITOR) 80 MG tablet Take 80 mg by mouth every evening.     . B Complex-C (B-COMPLEX WITH VITAMIN C) tablet Take 1 tablet by mouth daily.    . ferrous sulfate 325 (65 FE) MG EC tablet Take  325 mg by mouth daily with breakfast.    . insulin regular human CONCENTRATED (HUMULIN R) 500 UNIT/ML SOLN injection Inject 6-16 Units into the skin 3 (three) times daily with meals. 12/14:  Pt with vial & drawing up in a U-100 syringe:  6 units (= 30 units)  in the morning, 16 units (= 80 units) lunch, 10 units (= 50 units) at night. May adjust the 10 unit (50 unit) night dose using sliding scale as needed.    Marland Kitchen JARDIANCE 25 MG TABS tablet Take 25 mg by mouth at bedtime.     Marland Kitchen lisinopril (PRINIVIL,ZESTRIL) 10 MG tablet Take 10 mg by mouth at bedtime.     . Multiple Vitamins-Minerals (CENTRUM PO) Take by mouth daily.    . Omega-3 Fatty Acids (FISH OIL PO) Take 1 tablet by mouth daily.    Marland Kitchen oxyCODONE (OXY IR/ROXICODONE) 5 MG immediate release tablet Take 1-2 tablets (5-10 mg total) by mouth every 4 (four) hours as needed for moderate pain. 40 tablet 0  . sertraline (ZOLOFT) 100 MG tablet Take 100 mg by mouth daily.    . traZODone (DESYREL) 100 MG tablet Take 100 mg by mouth at bedtime.     No current facility-administered medications for this visit.      Allergies:  Allergies  Allergen Reactions  . No Known Allergies Other (See Comments)    Previously listed PCN Allergy not active.   Per Pt interview, "has taken a number of PCN derivatives" Taken Amoxicillin, Cephalosporins.    Past Medical History, Surgical history, Social history, and Family History were reviewed and updated.    Physical Exam:  Blood pressure (!) 146/54, pulse 80, temperature 97.9 F (36.6 C), temperature source Oral, resp. rate 18, height 6\' 1"  (1.854 m), weight 275 lb 3.2 oz (124.8 kg), SpO2 97 %. ECOG:1 General appearance: Alert, awake gentleman without distress. Head: Normocephalic, without obvious abnormality no oral ulcers or lesions. Neck: no adenopathy Lymph nodes: Cervical, supraclavicular, and axillary nodes normal. Heart:regular rate and rhythm, S1, S2 normal, no murmur, click, rub or gallop Lung:  Scattered expiratory wheezes and rhonchi noted on his examination. Abdomin: Soft, obese. No splenomegaly or rebound. No guarding. EXT:no erythema, induration.   CBC    Component Value Date/Time   WBC 7.0 09/08/2016 1423   WBC 5.9 07/24/2016 0617   RBC 4.94 09/08/2016 1423   RBC 4.71 07/24/2016 0617   HGB 14.2 09/08/2016 1423   HCT 43.4 09/08/2016 1423   PLT 133 (L) 09/08/2016 1423   MCV 87.8 09/08/2016 1423   MCH 28.7 09/08/2016 1423   MCH 28.9 07/24/2016 0617   MCHC 32.6 09/08/2016 1423   MCHC 32.2 07/24/2016 0617   RDW 14.7 (H) 09/08/2016 1423   LYMPHSABS 1.8 09/08/2016 1423   MONOABS 0.4 09/08/2016 1423   EOSABS 0.1 09/08/2016 1423   BASOSABS 0.0 09/08/2016 1423      Chemistry      Component Value Date/Time   NA 139 07/24/2016 0617   NA 139 03/03/2016 1018   K 4.2 07/24/2016 0617   K 4.3 03/03/2016 1018   CL 107 07/24/2016 0617   CO2 26 07/24/2016 0617   CO2 21 (L) 03/03/2016 1018   BUN 14 07/24/2016 0617   BUN 20.6 03/03/2016 1018   CREATININE 1.12 07/24/2016 0617   CREATININE 1.2 03/03/2016 1018      Component Value Date/Time   CALCIUM 8.2 (L) 07/24/2016 0617   CALCIUM 8.4 03/03/2016 1018   ALKPHOS 101 03/03/2016 1018   AST 19 03/03/2016 1018   ALT 18 03/03/2016 1018   BILITOT 0.60 03/03/2016 1018      Impression and Plan:  66 year old gentleman with the following issues:  1. Non-Hodgkin's lymphoma presented with massive splenomegaly and extensive upper abdominal lymphadenopathy with a diagnosis confirmed in July of 2014 is likely mantle cell lymphoma. He is status post 5 cycles of chemotherapy.   PET/CT scan on 03/07/2015 showed no evidence of disease relapse indicating complete response.   His laboratory data and physical examination do not suggest any recurrent disease. He has no lymphocytosis noted on his peripheral smear.   The plan is to continue follow him clinically and repeat imaging studies he develops any specific symptoms. I anticipate  routine imaging studies in next 6-12 months.  2. Diabetes mellitus: Followed by Micheal Thompson.   3. Followup: In 6  Months.   Micheal Button, MD 1/30/20182:44 PM

## 2016-10-06 ENCOUNTER — Encounter: Payer: Self-pay | Admitting: Internal Medicine

## 2016-10-06 DIAGNOSIS — Z794 Long term (current) use of insulin: Secondary | ICD-10-CM | POA: Diagnosis not present

## 2016-10-06 DIAGNOSIS — E1165 Type 2 diabetes mellitus with hyperglycemia: Secondary | ICD-10-CM | POA: Diagnosis not present

## 2016-10-06 DIAGNOSIS — E1142 Type 2 diabetes mellitus with diabetic polyneuropathy: Secondary | ICD-10-CM | POA: Diagnosis not present

## 2016-10-20 DIAGNOSIS — E1142 Type 2 diabetes mellitus with diabetic polyneuropathy: Secondary | ICD-10-CM | POA: Diagnosis not present

## 2016-10-20 DIAGNOSIS — Z7689 Persons encountering health services in other specified circumstances: Secondary | ICD-10-CM | POA: Diagnosis not present

## 2016-10-20 DIAGNOSIS — Z794 Long term (current) use of insulin: Secondary | ICD-10-CM | POA: Diagnosis not present

## 2016-10-20 DIAGNOSIS — E1165 Type 2 diabetes mellitus with hyperglycemia: Secondary | ICD-10-CM | POA: Diagnosis not present

## 2016-10-20 DIAGNOSIS — Z8572 Personal history of non-Hodgkin lymphomas: Secondary | ICD-10-CM | POA: Diagnosis not present

## 2016-10-20 DIAGNOSIS — F418 Other specified anxiety disorders: Secondary | ICD-10-CM | POA: Diagnosis not present

## 2016-10-20 DIAGNOSIS — R69 Illness, unspecified: Secondary | ICD-10-CM | POA: Diagnosis not present

## 2016-10-20 DIAGNOSIS — F5105 Insomnia due to other mental disorder: Secondary | ICD-10-CM | POA: Diagnosis not present

## 2016-10-22 DIAGNOSIS — R69 Illness, unspecified: Secondary | ICD-10-CM | POA: Diagnosis not present

## 2016-11-20 DIAGNOSIS — Z125 Encounter for screening for malignant neoplasm of prostate: Secondary | ICD-10-CM | POA: Diagnosis not present

## 2016-11-20 DIAGNOSIS — Z87891 Personal history of nicotine dependence: Secondary | ICD-10-CM | POA: Diagnosis not present

## 2016-11-20 DIAGNOSIS — E1142 Type 2 diabetes mellitus with diabetic polyneuropathy: Secondary | ICD-10-CM | POA: Diagnosis not present

## 2016-11-20 DIAGNOSIS — R69 Illness, unspecified: Secondary | ICD-10-CM | POA: Diagnosis not present

## 2016-11-20 DIAGNOSIS — R05 Cough: Secondary | ICD-10-CM | POA: Diagnosis not present

## 2016-11-20 DIAGNOSIS — Z794 Long term (current) use of insulin: Secondary | ICD-10-CM | POA: Diagnosis not present

## 2016-11-20 DIAGNOSIS — Z1211 Encounter for screening for malignant neoplasm of colon: Secondary | ICD-10-CM | POA: Diagnosis not present

## 2016-11-20 DIAGNOSIS — Z136 Encounter for screening for cardiovascular disorders: Secondary | ICD-10-CM | POA: Diagnosis not present

## 2016-11-20 DIAGNOSIS — Z0000 Encounter for general adult medical examination without abnormal findings: Secondary | ICD-10-CM | POA: Diagnosis not present

## 2016-11-25 DIAGNOSIS — Z136 Encounter for screening for cardiovascular disorders: Secondary | ICD-10-CM | POA: Diagnosis not present

## 2016-11-25 DIAGNOSIS — Z125 Encounter for screening for malignant neoplasm of prostate: Secondary | ICD-10-CM | POA: Diagnosis not present

## 2016-12-02 DIAGNOSIS — I10 Essential (primary) hypertension: Secondary | ICD-10-CM | POA: Diagnosis not present

## 2016-12-02 DIAGNOSIS — Z87891 Personal history of nicotine dependence: Secondary | ICD-10-CM | POA: Diagnosis not present

## 2016-12-02 DIAGNOSIS — Z136 Encounter for screening for cardiovascular disorders: Secondary | ICD-10-CM | POA: Diagnosis not present

## 2016-12-18 DIAGNOSIS — E1142 Type 2 diabetes mellitus with diabetic polyneuropathy: Secondary | ICD-10-CM | POA: Diagnosis not present

## 2016-12-18 DIAGNOSIS — E119 Type 2 diabetes mellitus without complications: Secondary | ICD-10-CM | POA: Diagnosis not present

## 2016-12-18 DIAGNOSIS — H2513 Age-related nuclear cataract, bilateral: Secondary | ICD-10-CM | POA: Diagnosis not present

## 2016-12-18 DIAGNOSIS — Z794 Long term (current) use of insulin: Secondary | ICD-10-CM | POA: Diagnosis not present

## 2016-12-18 DIAGNOSIS — H524 Presbyopia: Secondary | ICD-10-CM | POA: Diagnosis not present

## 2016-12-18 DIAGNOSIS — H5203 Hypermetropia, bilateral: Secondary | ICD-10-CM | POA: Diagnosis not present

## 2016-12-18 DIAGNOSIS — E1165 Type 2 diabetes mellitus with hyperglycemia: Secondary | ICD-10-CM | POA: Diagnosis not present

## 2016-12-21 DIAGNOSIS — I723 Aneurysm of iliac artery: Secondary | ICD-10-CM | POA: Diagnosis not present

## 2016-12-21 DIAGNOSIS — Z8572 Personal history of non-Hodgkin lymphomas: Secondary | ICD-10-CM | POA: Diagnosis not present

## 2017-01-05 DIAGNOSIS — Z8709 Personal history of other diseases of the respiratory system: Secondary | ICD-10-CM | POA: Diagnosis not present

## 2017-01-05 DIAGNOSIS — R69 Illness, unspecified: Secondary | ICD-10-CM | POA: Diagnosis not present

## 2017-01-06 DIAGNOSIS — K649 Unspecified hemorrhoids: Secondary | ICD-10-CM | POA: Diagnosis not present

## 2017-01-06 DIAGNOSIS — Z1211 Encounter for screening for malignant neoplasm of colon: Secondary | ICD-10-CM | POA: Diagnosis not present

## 2017-01-06 DIAGNOSIS — K648 Other hemorrhoids: Secondary | ICD-10-CM | POA: Diagnosis not present

## 2017-01-06 DIAGNOSIS — Z86010 Personal history of colonic polyps: Secondary | ICD-10-CM | POA: Diagnosis not present

## 2017-01-06 DIAGNOSIS — K5730 Diverticulosis of large intestine without perforation or abscess without bleeding: Secondary | ICD-10-CM | POA: Diagnosis not present

## 2017-01-07 DIAGNOSIS — Z794 Long term (current) use of insulin: Secondary | ICD-10-CM | POA: Diagnosis not present

## 2017-01-07 DIAGNOSIS — E1165 Type 2 diabetes mellitus with hyperglycemia: Secondary | ICD-10-CM | POA: Diagnosis not present

## 2017-01-07 DIAGNOSIS — E1142 Type 2 diabetes mellitus with diabetic polyneuropathy: Secondary | ICD-10-CM | POA: Diagnosis not present

## 2017-03-04 DIAGNOSIS — K056 Periodontal disease, unspecified: Secondary | ICD-10-CM | POA: Diagnosis not present

## 2017-03-04 DIAGNOSIS — H53143 Visual discomfort, bilateral: Secondary | ICD-10-CM | POA: Diagnosis not present

## 2017-03-04 DIAGNOSIS — R21 Rash and other nonspecific skin eruption: Secondary | ICD-10-CM | POA: Diagnosis not present

## 2017-03-09 DIAGNOSIS — R21 Rash and other nonspecific skin eruption: Secondary | ICD-10-CM | POA: Diagnosis not present

## 2017-03-10 ENCOUNTER — Ambulatory Visit (HOSPITAL_BASED_OUTPATIENT_CLINIC_OR_DEPARTMENT_OTHER): Payer: Medicare HMO | Admitting: Oncology

## 2017-03-10 ENCOUNTER — Telehealth: Payer: Self-pay | Admitting: Oncology

## 2017-03-10 ENCOUNTER — Other Ambulatory Visit (HOSPITAL_COMMUNITY)
Admission: RE | Admit: 2017-03-10 | Discharge: 2017-03-10 | Disposition: A | Discharge status: Home / Self Care | Payer: Medicare HMO | Source: Ambulatory Visit | Attending: Oncology | Admitting: Oncology

## 2017-03-10 ENCOUNTER — Other Ambulatory Visit (HOSPITAL_BASED_OUTPATIENT_CLINIC_OR_DEPARTMENT_OTHER): Payer: Medicare HMO

## 2017-03-10 VITALS — BP 121/58 | HR 71 | Temp 97.9°F | Resp 18 | Ht 73.0 in | Wt 248.6 lb

## 2017-03-10 DIAGNOSIS — C8317 Mantle cell lymphoma, spleen: Secondary | ICD-10-CM

## 2017-03-10 DIAGNOSIS — D72820 Lymphocytosis (symptomatic): Secondary | ICD-10-CM

## 2017-03-10 DIAGNOSIS — R0609 Other forms of dyspnea: Secondary | ICD-10-CM | POA: Diagnosis not present

## 2017-03-10 DIAGNOSIS — D7589 Other specified diseases of blood and blood-forming organs: Secondary | ICD-10-CM | POA: Diagnosis not present

## 2017-03-10 DIAGNOSIS — E119 Type 2 diabetes mellitus without complications: Secondary | ICD-10-CM

## 2017-03-10 DIAGNOSIS — R21 Rash and other nonspecific skin eruption: Secondary | ICD-10-CM | POA: Diagnosis not present

## 2017-03-10 DIAGNOSIS — R161 Splenomegaly, not elsewhere classified: Secondary | ICD-10-CM

## 2017-03-10 DIAGNOSIS — C8597 Non-Hodgkin lymphoma, unspecified, spleen: Secondary | ICD-10-CM | POA: Diagnosis not present

## 2017-03-10 DIAGNOSIS — R140 Abdominal distension (gaseous): Secondary | ICD-10-CM | POA: Diagnosis not present

## 2017-03-10 LAB — CBC WITH DIFFERENTIAL/PLATELET
BASO%: 0.2 % (ref 0.0–2.0)
Basophils Absolute: 0.1 10*3/uL (ref 0.0–0.1)
EOS%: 0.4 % (ref 0.0–7.0)
Eosinophils Absolute: 0.3 10*3/uL (ref 0.0–0.5)
HEMATOCRIT: 38.2 % — AB (ref 38.4–49.9)
HEMOGLOBIN: 11.9 g/dL — AB (ref 13.0–17.1)
LYMPH#: 59 10*3/uL — AB (ref 0.9–3.3)
LYMPH%: 89.4 % — ABNORMAL HIGH (ref 14.0–49.0)
MCH: 27.1 pg — AB (ref 27.2–33.4)
MCHC: 31.1 g/dL — ABNORMAL LOW (ref 32.0–36.0)
MCV: 87.2 fL (ref 79.3–98.0)
MONO#: 1.1 10*3/uL — AB (ref 0.1–0.9)
MONO%: 1.7 % (ref 0.0–14.0)
NEUT#: 5.5 10*3/uL (ref 1.5–6.5)
NEUT%: 8.3 % — ABNORMAL LOW (ref 39.0–75.0)
NRBC: 0 % (ref 0–0)
Platelets: 125 10*3/uL — ABNORMAL LOW (ref 140–400)
RBC: 4.38 10*6/uL (ref 4.20–5.82)
RDW: 17.6 % — AB (ref 11.0–14.6)
WBC: 65.9 10*3/uL (ref 4.0–10.3)

## 2017-03-10 LAB — COMPREHENSIVE METABOLIC PANEL
ALBUMIN: 3.6 g/dL (ref 3.5–5.0)
ALK PHOS: 158 U/L — AB (ref 40–150)
ALT: 15 U/L (ref 0–55)
ANION GAP: 7 meq/L (ref 3–11)
AST: 24 U/L (ref 5–34)
BILIRUBIN TOTAL: 0.48 mg/dL (ref 0.20–1.20)
BUN: 32.7 mg/dL — ABNORMAL HIGH (ref 7.0–26.0)
CO2: 19 mEq/L — ABNORMAL LOW (ref 22–29)
Calcium: 9 mg/dL (ref 8.4–10.4)
Chloride: 111 mEq/L — ABNORMAL HIGH (ref 98–109)
Creatinine: 1.7 mg/dL — ABNORMAL HIGH (ref 0.7–1.3)
EGFR: 41 mL/min/{1.73_m2} — AB (ref >90)
GLUCOSE: 235 mg/dL — AB (ref 70–140)
Potassium: 6.6 mEq/L (ref 3.5–5.1)
SODIUM: 137 meq/L (ref 136–145)
TOTAL PROTEIN: 6.4 g/dL (ref 6.4–8.3)

## 2017-03-10 LAB — TECHNOLOGIST REVIEW

## 2017-03-10 NOTE — Addendum Note (Signed)
Addended by: Wyatt Portela on: 03/10/2017 12:08 PM   Modules accepted: Orders

## 2017-03-10 NOTE — Progress Notes (Addendum)
Hematology and Oncology Follow Up Visit  Micheal Thompson [id number] 1950-10-07 66 y.o. 03/10/2017 10:42 AM   Principle Diagnosis: 66 year old gentleman  presented with splenomegaly and lymphadenopathy. His workup showed mantle cell lymphoma diagnosed in July of 2014. He presented with lymphocytosis.  Prior therapy: Chemotherapy with  bendamustine and rituximab started on 02/28/2013. He is S/P 5 cycles completed in 07/2013. He achieved complete response at that time.  Current therapy: Observation and surveillance.  Interim History: Micheal Thompson presents today for a followup visit. Since his visit, he reports no major changes in his health. He denied any lymphadenopathy or petechiae. Has not reported any constitutional symptoms of fevers or chills or sweats. He continues to perform activities of daily living without any decline. He has lost weight although some of it is intentional. He has a reasonable quality of life without any decline. He does report very mild dyspnea on exertion which has not changed dramatically. He did develop lower show any rash and is currently being treated with topical creams.   He does not report any headaches or blurry vision or double vision. Does not report any syncope. Does not report any chest pain does report some occasional exertional dyspnea. Does not report any cough or hemoptysis. Report any nausea or vomiting or hematochezia. Does report some abdominal distention as mentioned. Does not report any frequency urgency or hesitancy. Does not report any lymphadenopathy or petechiae. His rest of review of systems unremarkable.  Medications: I have reviewed the patient's current medications.  Current Outpatient Prescriptions  Medication Sig Dispense Refill  . aspirin 81 MG tablet Take 81 mg by mouth at bedtime.     Marland Kitchen atorvastatin (LIPITOR) 80 MG tablet Take 80 mg by mouth every evening.     . B Complex-C (B-COMPLEX WITH VITAMIN C) tablet Take 1 tablet by mouth daily.    .  ferrous sulfate 325 (65 FE) MG EC tablet Take 325 mg by mouth daily with breakfast.    . insulin regular human CONCENTRATED (HUMULIN R) 500 UNIT/ML SOLN injection Inject 6-16 Units into the skin 3 (three) times daily with meals. 12/14:  Pt with vial & drawing up in a U-100 syringe:  6 units (= 30 units)  in the morning, 16 units (= 80 units) lunch, 10 units (= 50 units) at night. May adjust the 10 unit (50 unit) night dose using sliding scale as needed.    Marland Kitchen JARDIANCE 25 MG TABS tablet Take 25 mg by mouth at bedtime.     Marland Kitchen lisinopril (PRINIVIL,ZESTRIL) 10 MG tablet Take 10 mg by mouth at bedtime.     . Multiple Vitamins-Minerals (CENTRUM PO) Take by mouth daily.    . Omega-3 Fatty Acids (FISH OIL PO) Take 1 tablet by mouth daily.    Marland Kitchen oxyCODONE (OXY IR/ROXICODONE) 5 MG immediate release tablet Take 1-2 tablets (5-10 mg total) by mouth every 4 (four) hours as needed for moderate pain. 40 tablet 0  . sertraline (ZOLOFT) 100 MG tablet Take 100 mg by mouth daily.    . traZODone (DESYREL) 100 MG tablet Take 100 mg by mouth at bedtime.     No current facility-administered medications for this visit.      Allergies:  Allergies  Allergen Reactions  . No Known Allergies Other (See Comments)    Previously listed PCN Allergy not active.   Per Pt interview, "has taken a number of PCN derivatives" Taken Amoxicillin, Cephalosporins.    Past Medical History, Surgical history, Social history, and  Family History were reviewed and updated.    Physical Exam: Blood pressure (!) 121/58, pulse 71, temperature 97.9 F (36.6 C), temperature source Oral, resp. rate 18, height 6\' 1"  (1.854 m), weight 248 lb 9.6 oz (112.8 kg), SpO2 98 %. ECOG:1 General appearance: Well-appearing gentleman without distress. Head: Normocephalic, without obvious abnormality no oral ulcers or lesions. Neck: no adenopathy Lymph nodes: Cervical, supraclavicular, and axillary nodes normal. Heart:regular rate and rhythm, S1, S2  normal, no murmur, click, rub or gallop Lung: Scattered expiratory wheezes and rhonchi noted on his examination. Abdomin: Soft, obese. I could not appreciate any splenomegaly. EXT: Mild erythema noted on his left leg.  CBC    Component Value Date/Time   WBC 65.9 (HH) 03/10/2017 1009   WBC 5.9 07/24/2016 0617   RBC 4.38 03/10/2017 1009   RBC 4.71 07/24/2016 0617   HGB 11.9 (L) 03/10/2017 1009   HCT 38.2 (L) 03/10/2017 1009   PLT 125 (L) 03/10/2017 1009   MCV 87.2 03/10/2017 1009   MCH 27.1 (L) 03/10/2017 1009   MCH 28.9 07/24/2016 0617   MCHC 31.1 (L) 03/10/2017 1009   MCHC 32.2 07/24/2016 0617   RDW 17.6 (H) 03/10/2017 1009   LYMPHSABS 59.0 (H) 03/10/2017 1009   MONOABS 1.1 (H) 03/10/2017 1009   EOSABS 0.3 03/10/2017 1009   BASOSABS 0.1 03/10/2017 1009      Chemistry      Component Value Date/Time   NA 142 09/08/2016 1423   K 4.9 09/08/2016 1423   CL 107 07/24/2016 0617   CO2 29 09/08/2016 1423   BUN 17.4 09/08/2016 1423   CREATININE 1.2 09/08/2016 1423      Component Value Date/Time   CALCIUM 8.6 09/08/2016 1423   ALKPHOS 148 09/08/2016 1423   AST 19 09/08/2016 1423   ALT 19 09/08/2016 1423   BILITOT 0.63 09/08/2016 1423      Impression and Plan:  66 year old gentleman with the following issues:  1. Non-Hodgkin's lymphoma presented with massive splenomegaly and extensive upper abdominal lymphadenopathy with a diagnosis confirmed in July of 2014 is likely mantle cell lymphoma. He is status post 5 cycles of chemotherapy.   PET/CT scan on 03/07/2015 showed no evidence of disease relapse indicating complete response.   His CBC did show increase in his white cell count and lymphocytosis suggestive of relapse disease. I will restage him at this time would imaging studies including CT scan chest abdomen and pelvis as well as repeat flow cytometry on his peripheral blood. Different salvage therapy might be needed at this time depending on his disease burden. He will  return in the next 1-2 weeks to discuss these options once his staging workup is completed. He is asymptomatic at this time but certainly will be  He is left untreated.  2. Diabetes mellitus: Followed by Dr. Joylene Draft.   3. Followup: In the next few weeks to discuss the results of his imaging studies and start treatment in the immediate future.  Renise Gillies, MD 8/1/201810:42 AM   His peripheral smear was personally reviewed and showed abnormal lymphocytes suggestive of a lymphoproliferative disorder.  His electrolytes were also reviewed today and discussed with him over the phone. He appears to have worsening renal function and increase in his potassium. He has been eating a lot of potassium rich diet and currently on lisinopril. I have asked him to discontinue lisinopril for the time being and eliminate any potassium-rich diet at this time. I will repeat his electrolytes on the day of his CT  scan. He verbalizes he understands these instructions.  [facility]  MD 03/10/17

## 2017-03-10 NOTE — Telephone Encounter (Signed)
Gave patient avs report and appointments for August. Central radiology will call re scan.  °

## 2017-03-16 ENCOUNTER — Other Ambulatory Visit: Payer: Medicare HMO

## 2017-03-16 LAB — FLOW CYTOMETRY

## 2017-03-19 ENCOUNTER — Ambulatory Visit (HOSPITAL_COMMUNITY)
Admission: RE | Admit: 2017-03-19 | Discharge: 2017-03-19 | Disposition: A | Discharge status: Home / Self Care | Payer: Medicare HMO | Source: Ambulatory Visit | Attending: Oncology | Admitting: Oncology

## 2017-03-19 ENCOUNTER — Encounter (HOSPITAL_COMMUNITY): Payer: Self-pay

## 2017-03-19 DIAGNOSIS — R591 Generalized enlarged lymph nodes: Secondary | ICD-10-CM | POA: Diagnosis not present

## 2017-03-19 DIAGNOSIS — R161 Splenomegaly, not elsewhere classified: Secondary | ICD-10-CM | POA: Insufficient documentation

## 2017-03-19 DIAGNOSIS — I2510 Atherosclerotic heart disease of native coronary artery without angina pectoris: Secondary | ICD-10-CM | POA: Diagnosis not present

## 2017-03-19 DIAGNOSIS — N281 Cyst of kidney, acquired: Secondary | ICD-10-CM | POA: Diagnosis not present

## 2017-03-19 DIAGNOSIS — C8317 Mantle cell lymphoma, spleen: Secondary | ICD-10-CM | POA: Diagnosis not present

## 2017-03-19 DIAGNOSIS — I700 Atherosclerosis of aorta: Secondary | ICD-10-CM | POA: Insufficient documentation

## 2017-03-22 ENCOUNTER — Telehealth: Payer: Self-pay | Admitting: Pharmacy Technician

## 2017-03-22 ENCOUNTER — Telehealth: Payer: Self-pay | Admitting: *Deleted

## 2017-03-22 ENCOUNTER — Ambulatory Visit (HOSPITAL_BASED_OUTPATIENT_CLINIC_OR_DEPARTMENT_OTHER): Payer: Medicare HMO

## 2017-03-22 ENCOUNTER — Ambulatory Visit (HOSPITAL_BASED_OUTPATIENT_CLINIC_OR_DEPARTMENT_OTHER): Payer: Medicare HMO | Admitting: Oncology

## 2017-03-22 ENCOUNTER — Telehealth: Payer: Self-pay | Admitting: Oncology

## 2017-03-22 VITALS — BP 120/52 | HR 73 | Temp 98.6°F | Resp 18 | Ht 73.0 in | Wt 244.9 lb

## 2017-03-22 DIAGNOSIS — C8597 Non-Hodgkin lymphoma, unspecified, spleen: Secondary | ICD-10-CM | POA: Diagnosis not present

## 2017-03-22 DIAGNOSIS — E119 Type 2 diabetes mellitus without complications: Secondary | ICD-10-CM | POA: Diagnosis not present

## 2017-03-22 DIAGNOSIS — N289 Disorder of kidney and ureter, unspecified: Secondary | ICD-10-CM | POA: Diagnosis not present

## 2017-03-22 DIAGNOSIS — E875 Hyperkalemia: Secondary | ICD-10-CM

## 2017-03-22 DIAGNOSIS — R0609 Other forms of dyspnea: Secondary | ICD-10-CM | POA: Diagnosis not present

## 2017-03-22 DIAGNOSIS — C8317 Mantle cell lymphoma, spleen: Secondary | ICD-10-CM

## 2017-03-22 LAB — BASIC METABOLIC PANEL
ANION GAP: 9 meq/L (ref 3–11)
BUN: 27.4 mg/dL — AB (ref 7.0–26.0)
CO2: 22 mEq/L (ref 22–29)
Calcium: 9.2 mg/dL (ref 8.4–10.4)
Chloride: 109 mEq/L (ref 98–109)
Creatinine: 1.6 mg/dL — ABNORMAL HIGH (ref 0.7–1.3)
EGFR: 44 mL/min/{1.73_m2} — ABNORMAL LOW (ref >90)
GLUCOSE: 204 mg/dL — AB (ref 70–140)
POTASSIUM: 5.1 meq/L (ref 3.5–5.1)
Sodium: 141 mEq/L (ref 136–145)

## 2017-03-22 MED ORDER — IBRUTINIB 560 MG PO TABS
560.0000 mg | ORAL_TABLET | Freq: Every day | ORAL | 0 refills | Status: DC
Start: 2017-03-22 — End: 2017-03-24

## 2017-03-22 NOTE — Telephone Encounter (Signed)
Spoke with patient, per dr [name], potassium is back to normal

## 2017-03-22 NOTE — Telephone Encounter (Signed)
Oral Oncology Patient Advocate Encounter  Received notification from [employer] that prior authorization for Imbruvica is required.  PA submitted on CoverMyMeds Key [id number] Status is pending  Oral Oncology Clinic will continue to follow.  Fabio Asa. Melynda Keller, [place] [facility] [place] [phone] 03/22/2017 1:38 PM

## 2017-03-22 NOTE — Telephone Encounter (Signed)
-----   Message from Wyatt Portela, MD sent at 03/22/2017 12:01 PM EDT ----- Please let him know his K is back to normal.  Thanks,  FS

## 2017-03-22 NOTE — Telephone Encounter (Signed)
Gave patient avs and calendar for upcoming appointments.  °

## 2017-03-22 NOTE — Progress Notes (Signed)
Hematology and Oncology Follow Up Visit  Micheal Thompson [id number] 02/18/1951 66 y.o. 03/22/2017 9:44 AM   Principle Diagnosis: 66 year old gentleman  presented with splenomegaly and lymphadenopathy. His workup showed mantle cell lymphoma diagnosed in July of 2014. He presented with lymphocytosis.  Prior therapy: Chemotherapy with  bendamustine and rituximab started on 02/28/2013. He is S/P 5 cycles completed in 07/2013. He achieved complete response at that time.  Current therapy: Ibrutinib 560 mg daily to start in the near future.  Interim History: Micheal Thompson presents today for a followup visit. Since his visit, he reports few complaints. He is reporting slight fatigue and periodic night sweats. Is not reporting any abdominal pain or early satiety. He is eating well although he lost some weight. He stopped his ACE inhibitor for the time being and have cut down on potassium rich diet. He denied any lymphadenopathy or petechiae. He continues to perform activities of daily living without any decline.    He does not report any headaches or blurry vision or double vision. Does not report any syncope. Does not report any chest pain does report some occasional exertional dyspnea. Does not report any cough or hemoptysis. Report any nausea or vomiting or hematochezia. Does report some abdominal distention as mentioned. Does not report any frequency urgency or hesitancy. Does not report any lymphadenopathy or petechiae. His rest of review of systems unremarkable.  Medications: I have reviewed the patient's current medications.  Current Outpatient Prescriptions  Medication Sig Dispense Refill  . aspirin 81 MG tablet Take 81 mg by mouth at bedtime.     Marland Kitchen atorvastatin (LIPITOR) 80 MG tablet Take 80 mg by mouth every evening.     . B Complex-C (B-COMPLEX WITH VITAMIN C) tablet Take 1 tablet by mouth daily.    . ferrous sulfate 325 (65 FE) MG EC tablet Take 325 mg by mouth daily with breakfast.    . insulin  regular human CONCENTRATED (HUMULIN R) 500 UNIT/ML SOLN injection Inject 6-16 Units into the skin 3 (three) times daily with meals. 12/14:  Pt with vial & drawing up in a U-100 syringe:  6 units (= 30 units)  in the morning, 16 units (= 80 units) lunch, 10 units (= 50 units) at night. May adjust the 10 unit (50 unit) night dose using sliding scale as needed.    Marland Kitchen JARDIANCE 25 MG TABS tablet Take 25 mg by mouth at bedtime.     . Multiple Vitamins-Minerals (CENTRUM PO) Take by mouth daily.    . Omega-3 Fatty Acids (FISH OIL PO) Take 1 tablet by mouth daily.    . sertraline (ZOLOFT) 100 MG tablet Take 100 mg by mouth daily.    . traZODone (DESYREL) 100 MG tablet Take 100 mg by mouth at bedtime.    . Ibrutinib 560 MG TABS Take 560 mg by mouth daily. 120 tablet 0  . lisinopril (PRINIVIL,ZESTRIL) 10 MG tablet Take 10 mg by mouth at bedtime.     Marland Kitchen oxyCODONE (OXY IR/ROXICODONE) 5 MG immediate release tablet Take 1-2 tablets (5-10 mg total) by mouth every 4 (four) hours as needed for moderate pain. (Patient not taking: Reported on 03/22/2017) 40 tablet 0   No current facility-administered medications for this visit.      Allergies:  Allergies  Allergen Reactions  . No Known Allergies Other (See Comments)    Previously listed PCN Allergy not active.   Per Pt interview, "has taken a number of PCN derivatives" Taken Amoxicillin, Cephalosporins.  Past Medical History, Surgical history, Social history, and Family History were reviewed and updated.    Physical Exam: Blood pressure (!) 120/52, pulse 73, temperature 98.6 F (37 C), temperature source Oral, resp. rate 18, height 6\' 1"  (1.854 m), weight 244 lb 14.4 oz (111.1 kg), SpO2 98 %. ECOG:1 General appearance: Alert, awake gentleman without distress. Head: Normocephalic, without obvious abnormality no oral thrush noted. Neck: no adenopathy Lymph nodes: Cervical, supraclavicular, and axillary nodes normal. Heart:regular rate and rhythm, S1, S2  normal, no murmur, click, rub or gallop Lung: Scattered expiratory wheezes and rhonchi noted on his examination. Abdomin: Soft, obese. Good bowel sounds with mild splenomegaly. EXT: Mild erythema noted on his left leg.  CBC    Component Value Date/Time   WBC 65.9 (HH) 03/10/2017 1009   WBC 5.9 07/24/2016 0617   RBC 4.38 03/10/2017 1009   RBC 4.71 07/24/2016 0617   HGB 11.9 (L) 03/10/2017 1009   HCT 38.2 (L) 03/10/2017 1009   PLT 125 (L) 03/10/2017 1009   MCV 87.2 03/10/2017 1009   MCH 27.1 (L) 03/10/2017 1009   MCH 28.9 07/24/2016 0617   MCHC 31.1 (L) 03/10/2017 1009   MCHC 32.2 07/24/2016 0617   RDW 17.6 (H) 03/10/2017 1009   LYMPHSABS 59.0 (H) 03/10/2017 1009   MONOABS 1.1 (H) 03/10/2017 1009   EOSABS 0.3 03/10/2017 1009   BASOSABS 0.1 03/10/2017 1009      Chemistry      Component Value Date/Time   NA 137 03/10/2017 1009   K 6.6 No visable hemolysis (HH) 03/10/2017 1009   CL 107 07/24/2016 0617   CO2 19 (L) 03/10/2017 1009   BUN 32.7 (H) 03/10/2017 1009   CREATININE 1.7 (H) 03/10/2017 1009      Component Value Date/Time   CALCIUM 9.0 03/10/2017 1009   ALKPHOS 158 (H) 03/10/2017 1009   AST 24 03/10/2017 1009   ALT 15 03/10/2017 1009   BILITOT 0.48 03/10/2017 1009     EXAM: CT CHEST, ABDOMEN AND PELVIS WITHOUT CONTRAST  TECHNIQUE: Multidetector CT imaging of the chest, abdomen and pelvis was performed following the standard protocol without IV contrast.  COMPARISON:  03/07/2015 PET-CT. 07/17/2014 CT chest, abdomen and pelvis.  FINDINGS: CT CHEST FINDINGS  Cardiovascular: Normal heart size. No significant pericardial fluid/thickening. Left main, left anterior descending, left circumflex and right coronary atherosclerosis. Great vessels are normal in course and caliber.  Mediastinum/Nodes: No discrete thyroid nodules. Unremarkable esophagus. No axillary adenopathy. New mildly enlarged 1.3 cm right internal mammary node (series 2/ image 24). Multiple  new enlarged right paratracheal nodes, largest 1.7 cm (series 2/ image 24) . New enlarged 1.3 cm left subcarinal node (series 2/image 30). No pathologically enlarged hilar nodes on this noncontrast scan.  Lungs/Pleura: No pneumothorax. No pleural effusion. Stable mild cylindrical bronchiectasis with parenchymal band and volume loss in the lingula compatible with postinfectious/postinflammatory scarring. No acute consolidative airspace disease, lung masses or significant pulmonary nodules.  Musculoskeletal: No aggressive appearing focal osseous lesions. Moderate thoracic spondylosis.  CT ABDOMEN PELVIS FINDINGS  Hepatobiliary: Normal liver with no liver mass. Cholecystectomy. No biliary ductal dilatation.  Pancreas: Normal, with no mass or duct dilation.  Spleen: Marked splenomegaly (craniocaudal splenic length 24.5 cm), new and markedly increased since 07/17/2014 where the craniocaudal splenic length was 10.1 cm. No discrete splenic masses.  Adrenals/Urinary Tract: Normal adrenals. No renal stones. No hydronephrosis. Simple small right renal cysts, largest 2.4 cm in the upper right kidney. No additional contour deforming renal lesions. Normal bladder.  Stomach/Bowel: Grossly normal stomach. Normal caliber small bowel with no small bowel wall thickening. Appendectomy. Mild scattered diverticulosis throughout the large bowel, with no large bowel wall thickening or pericolonic fat stranding.  Vascular/Lymphatic: Atherosclerotic nonaneurysmal abdominal aorta. Newly enlarged 1.5 cm porta hepatis node (series 2/ image 63). Newly enlarged 2.4 cm portacaval node (series 2/ image 69). New para celiac adenopathy measuring up to 1.1 cm (series 2/ image 63). No pathologically enlarged pelvic or mesenteric nodes.  Reproductive: Top-normal size prostate with nonspecific internal prostatic calcifications.  Other: No pneumoperitoneum, ascites or focal fluid  collection.  Musculoskeletal: No aggressive appearing focal osseous lesions. Mild lumbar spondylosis.  IMPRESSION: 1. Spectrum of findings compatible with recurrent lymphoma. New marked splenomegaly. New lymphadenopathy in the chest and abdomen involving the right internal mammary, right paratracheal, subcarinal and upper retroperitoneal nodal chains. 2. Aortic Atherosclerosis (ICD10-I70.0). Left main and 3 vessel coronary atherosclerosis.   Impression and Plan:  66 year old gentleman with the following issues:  1. Non-Hodgkin's lymphoma presented with massive splenomegaly and extensive upper abdominal lymphadenopathy with a diagnosis confirmed in July of 2014 is likely mantle cell lymphoma. He is status post 5 cycles of chemotherapy.   PET/CT scan on 03/07/2015 showed no evidence of disease relapse indicating complete response.   CT scan on 03/19/2017 was personally reviewed and discussed with the patient today. He clearly developed recurrent disease at this time. The natural course of this disease was discussed today with the patient. Immune options were reviewed which include multiagent systemic chemotherapy, stem cell transplant among other therapies. He had difficult time tolerating systemic chemotherapy previously and he is not a stem cell transplant candidate.  We discussed the risks and benefits and complications associated with Ibrutinib at 560 mg daily. Pertinent information was given to the patient. Complications associated with this medication include edema, fatigue, headaches, rash, cytopenias and rarely atrial fibrillation and other cardiovascular complications. After discussion today he is agreeable to proceed at this time.  2. Diabetes mellitus: Followed by his primary care physician.  3. Renal insufficiency: Related to diabetes. His creatinine showed slight increase since last visit and will be repeated today.  4. Hyperkalemia: He is not taking any potassium  supplements and have decreased potassium rich diet. His potassium will be repeated today.  3. Followup: In the next 4 weeks to follow his progress and assess his tolerance to this medication.  [facility], MD 8/13/20189:44 AM

## 2017-03-23 NOTE — Telephone Encounter (Signed)
Oral Oncology Patient Advocate Encounter  Prior Authorization for [name] has been approved.    PA# [id number]  Effective dates: 08/08/2016 through 08/09/2017.   Oral Oncology Clinic will continue to follow.   Fabio Asa. Melynda Keller, [facility] [place] [phone] 03/23/2017 8:51 AM

## 2017-03-24 ENCOUNTER — Telehealth: Payer: Self-pay | Admitting: *Deleted

## 2017-03-24 ENCOUNTER — Telehealth: Payer: Self-pay | Admitting: Pharmacist

## 2017-03-24 DIAGNOSIS — C8317 Mantle cell lymphoma, spleen: Secondary | ICD-10-CM

## 2017-03-24 MED ORDER — IBRUTINIB 560 MG PO TABS: 560.0000 mg | ORAL_TABLET | Freq: Every day | ORAL | 0 refills | Status: DC

## 2017-03-24 MED FILL — IMBRUVICA 560 MG TAB: 560 | 28 days supply | Qty: 28 | Fill #0

## 2017-03-24 NOTE — Telephone Encounter (Signed)
Oral Oncology Pharmacist Encounter  Received new prescription for Imbruvica for the treatment of recurrent mantle cell lymphoma, planned duration until disease progression or unacceptable toxicity.  Labs from 8/1 and 8/13 assessed assessed, OK for treatment, noted elevated SCr, no dose adjustments needed at this time.  Current medication list in Epic reviewed, DDIs with Imbruvica identified:  Imbruvica and aspirin: Category C interaction due to increased bleeding risk with use of 2 agents with anti-platelet effects.  Imbruvica and fish oil: Category C interaction due to increased bleeding risk with use of 2 agents with anti-platelet effects.  Aspirin and fish oil use will be discussed with patient  Prescription has been e-scribed to the [facility] for benefits analysis and approval. Prior authorization has been approved and copayment is $0 Imbruvic560mg  tablets will be ordered by the pharmacy and be ready for patient to pick-up tomorrow afternoon, 03/25/17.  I spoke with patient for overview of new oral chemotherapy medication: Imbruvica.  Planned start date: 03/26/17  Counseled patient on administration, dosing, side effects, safe handling, and monitoring. Patient will take Imbruvica 560mg  tablets, 1 tablet by mouth once daily with a full glass of water. Patient states he will take his Imbruvica 1st thing in the mornings. He knows to maintain adequate hydration and states he drinks 1-1.5 gallons of water daily due to his Jardiance.  Side effects include but not limited to: increased bruising and bleeding, rash, decreased blood counts, fatigue, and musculoskeletal pain. Patient states he will discontinue use of aspirin and fish oil and neither are being directed under the care of a physician and patient endorses bleeding and bruising easily at baseline.    Reviewed with patient importance of keeping a medication schedule and plan for any missed doses. He will pick up  his Imbruvica from the pharmacy tomorrow 03/25/17.  Micheal Thompson voiced understanding and appreciation.   All questions answered.  Patient knows to call the office with questions or concerns. Oral Oncology Clinic will continue to follow.  Thank you,  Micheal Thompson, PharmD, BCPS, BCOP 03/24/2017  11:51 AM Oral Oncology Clinic [phone]

## 2017-03-24 NOTE — Telephone Encounter (Signed)
ibrutinib approved, , but [facility] patient pharmacy needs to order drug, will be available tomorrow 03/25/17 and they will call patient, when ready.patient notified.

## 2017-03-25 DIAGNOSIS — Z794 Long term (current) use of insulin: Secondary | ICD-10-CM | POA: Diagnosis not present

## 2017-03-25 DIAGNOSIS — R21 Rash and other nonspecific skin eruption: Secondary | ICD-10-CM | POA: Diagnosis not present

## 2017-03-25 DIAGNOSIS — E1142 Type 2 diabetes mellitus with diabetic polyneuropathy: Secondary | ICD-10-CM | POA: Diagnosis not present

## 2017-03-25 DIAGNOSIS — E1165 Type 2 diabetes mellitus with hyperglycemia: Secondary | ICD-10-CM | POA: Diagnosis not present

## 2017-03-25 DIAGNOSIS — Z8572 Personal history of non-Hodgkin lymphomas: Secondary | ICD-10-CM | POA: Diagnosis not present

## 2017-04-16 ENCOUNTER — Other Ambulatory Visit: Payer: Self-pay | Admitting: Oncology

## 2017-04-16 DIAGNOSIS — C8317 Mantle cell lymphoma, spleen: Secondary | ICD-10-CM

## 2017-04-16 MED FILL — IMBRUVICA 560 MG TAB: 560 | 28 days supply | Qty: 28 | Fill #0

## 2017-04-20 ENCOUNTER — Other Ambulatory Visit (HOSPITAL_BASED_OUTPATIENT_CLINIC_OR_DEPARTMENT_OTHER): Payer: Medicare HMO

## 2017-04-20 ENCOUNTER — Telehealth: Payer: Self-pay

## 2017-04-20 ENCOUNTER — Ambulatory Visit (HOSPITAL_BASED_OUTPATIENT_CLINIC_OR_DEPARTMENT_OTHER): Payer: Medicare HMO | Admitting: Oncology

## 2017-04-20 VITALS — BP 127/61 | HR 60 | Temp 98.2°F | Resp 17 | Ht 73.0 in | Wt 237.1 lb

## 2017-04-20 DIAGNOSIS — C8317 Mantle cell lymphoma, spleen: Secondary | ICD-10-CM

## 2017-04-20 DIAGNOSIS — E119 Type 2 diabetes mellitus without complications: Secondary | ICD-10-CM

## 2017-04-20 DIAGNOSIS — C8597 Non-Hodgkin lymphoma, unspecified, spleen: Secondary | ICD-10-CM

## 2017-04-20 DIAGNOSIS — N289 Disorder of kidney and ureter, unspecified: Secondary | ICD-10-CM | POA: Diagnosis not present

## 2017-04-20 LAB — CBC WITH DIFFERENTIAL/PLATELET
BASO%: 0.1 % (ref 0.0–2.0)
Basophils Absolute: 0.1 10*3/uL (ref 0.0–0.1)
EOS ABS: 0.2 10*3/uL (ref 0.0–0.5)
EOS%: 0.1 % (ref 0.0–7.0)
HCT: 42 % (ref 38.4–49.9)
HGB: 13.4 g/dL (ref 13.0–17.1)
LYMPH#: 135.5 10*3/uL — AB (ref 0.9–3.3)
LYMPH%: 93.3 % — ABNORMAL HIGH (ref 14.0–49.0)
MCH: 28.5 pg (ref 27.2–33.4)
MCHC: 32 g/dL (ref 32.0–36.0)
MCV: 88.9 fL (ref 79.3–98.0)
MONO#: 1.1 10*3/uL — AB (ref 0.1–0.9)
MONO%: 0.7 % (ref 0.0–14.0)
NEUT%: 5.8 % — ABNORMAL LOW (ref 39.0–75.0)
NEUTROS ABS: 8.4 10*3/uL — AB (ref 1.5–6.5)
PLATELETS: 127 10*3/uL — AB (ref 140–400)
RBC: 4.72 10*6/uL (ref 4.20–5.82)
RDW: 17.5 % — AB (ref 11.0–14.6)
WBC: 145.3 10*3/uL — AB (ref 4.0–10.3)

## 2017-04-20 LAB — TECHNOLOGIST REVIEW

## 2017-04-20 LAB — COMPREHENSIVE METABOLIC PANEL
ALBUMIN: 4.2 g/dL (ref 3.5–5.0)
ALK PHOS: 108 U/L (ref 40–150)
ALT: 9 U/L (ref 0–55)
ANION GAP: 11 meq/L (ref 3–11)
AST: 17 U/L (ref 5–34)
BILIRUBIN TOTAL: 0.82 mg/dL (ref 0.20–1.20)
BUN: 31.6 mg/dL — ABNORMAL HIGH (ref 7.0–26.0)
CO2: 20 mEq/L — ABNORMAL LOW (ref 22–29)
CREATININE: 1.6 mg/dL — AB (ref 0.7–1.3)
Calcium: 9.4 mg/dL (ref 8.4–10.4)
Chloride: 106 mEq/L (ref 98–109)
EGFR: 46 mL/min/{1.73_m2} — AB (ref >90)
GLUCOSE: 200 mg/dL — AB (ref 70–140)
Potassium: 4.7 mEq/L (ref 3.5–5.1)
Sodium: 137 mEq/L (ref 136–145)
TOTAL PROTEIN: 7 g/dL (ref 6.4–8.3)

## 2017-04-20 NOTE — Telephone Encounter (Signed)
Printed avs and calender for patient per los

## 2017-04-20 NOTE — Progress Notes (Signed)
Hematology and Oncology Follow Up Visit  CONNY MOENING [id number] 1951/06/07 66 y.o. 04/20/2017 8:38 AM   Principle Diagnosis: 66 year old gentleman  presented with splenomegaly and lymphadenopathy. His workup showed mantle cell lymphoma diagnosed in July of 2014. He presented with lymphocytosis.  Prior therapy: Chemotherapy with  bendamustine and rituximab started on 02/28/2013. He is S/P 5 cycles completed in 07/2013. He achieved complete response at that time.  Current therapy: Ibrutinib 560 mg daily started in 03/2017.  Interim History: Mr. Boyajian presents today for a followup visit. Since his visit, he reports doing well without any recent complaints. He has been taking Ibrutinib and reports no complications. He does report easy bruising but no active bleeding. His appetite and performance status is back to baseline. He denied any fevers, chills, sweats. He did report weight loss that is intentional decline. He is trying to eat better and exercise regularly. He did have lower extremity edema that has resolved at this time.   He does not report any headaches or blurry vision or double vision. Does not report any syncope. Does not report any chest pain does report some occasional exertional dyspnea. Does not report any cough or hemoptysis. Report any nausea or vomiting or hematochezia. Does report some abdominal distention as mentioned. Does not report any frequency urgency or hesitancy. Does not report any lymphadenopathy or petechiae. His rest of review of systems unremarkable.  Medications: I have reviewed the patient's current medications.  Current Outpatient Prescriptions  Medication Sig Dispense Refill  . aspirin 81 MG tablet Take 81 mg by mouth at bedtime.     Marland Kitchen atorvastatin (LIPITOR) 80 MG tablet Take 80 mg by mouth every evening.     . B Complex-C (B-COMPLEX WITH VITAMIN C) tablet Take 1 tablet by mouth daily.    . ferrous sulfate 325 (65 FE) MG EC tablet Take 325 mg by mouth daily  with breakfast.    . IMBRUVICA 560 MG TABS TAKE 560 MG (1 TABLET) BY MOUTH DAILY. 28 tablet 0  . insulin regular human CONCENTRATED (HUMULIN R) 500 UNIT/ML SOLN injection Inject 6-16 Units into the skin 3 (three) times daily with meals. 12/14:  Pt with vial & drawing up in a U-100 syringe:  6 units (= 30 units)  in the morning, 16 units (= 80 units) lunch, 10 units (= 50 units) at night. May adjust the 10 unit (50 unit) night dose using sliding scale as needed.    Marland Kitchen JARDIANCE 25 MG TABS tablet Take 25 mg by mouth at bedtime.     Marland Kitchen lisinopril (PRINIVIL,ZESTRIL) 10 MG tablet Take 10 mg by mouth at bedtime.     . Multiple Vitamins-Minerals (CENTRUM PO) Take by mouth daily.    . Omega-3 Fatty Acids (FISH OIL PO) Take 1 tablet by mouth daily.    Marland Kitchen oxyCODONE (OXY IR/ROXICODONE) 5 MG immediate release tablet Take 1-2 tablets (5-10 mg total) by mouth every 4 (four) hours as needed for moderate pain. (Patient not taking: Reported on 03/22/2017) 40 tablet 0  . sertraline (ZOLOFT) 100 MG tablet Take 100 mg by mouth daily.    . traZODone (DESYREL) 100 MG tablet Take 100 mg by mouth at bedtime.     No current facility-administered medications for this visit.      Allergies:  Allergies  Allergen Reactions  . No Known Allergies Other (See Comments)    Previously listed PCN Allergy not active.   Per Pt interview, "has taken a number of PCN derivatives" Taken  Amoxicillin, Cephalosporins.    Past Medical History, Surgical history, Social history, and Family History were reviewed and updated.    Physical Exam: Blood pressure 127/61, pulse 60, temperature 98.2 F (36.8 C), temperature source Oral, resp. rate 17, height 6\' 1"  (1.854 m), weight 237 lb 1.6 oz (107.5 kg), SpO2 100 %. ECOG:1 General appearance: Hearing gentleman without distress. Head: Normocephalic, without obvious abnormality no oral ulcers or thrush. Neck: no adenopathy Lymph nodes: Cervical, supraclavicular, and axillary nodes  normal. Heart:regular rate and rhythm, S1, S2 normal, no murmur, click, rub or gallop Lung: Scattered expiratory wheezes and rhonchi noted on his examination. Abdomin: Soft, obese. Good bowel sounds. No shifting dullness or ascites. No enlarged spleen palpated. EXT: No edema noted.  CBC    Component Value Date/Time   WBC 145.3 (HH) 04/20/2017 0811   WBC 5.9 07/24/2016 0617   RBC 4.72 04/20/2017 0811   RBC 4.71 07/24/2016 0617   HGB 13.4 04/20/2017 0811   HCT 42.0 04/20/2017 0811   PLT 127 (L) 04/20/2017 0811   MCV 88.9 04/20/2017 0811   MCH 28.5 04/20/2017 0811   MCH 28.9 07/24/2016 0617   MCHC 32.0 04/20/2017 0811   MCHC 32.2 07/24/2016 0617   RDW 17.5 (H) 04/20/2017 0811   LYMPHSABS 135.5 (H) 04/20/2017 0811   MONOABS 1.1 (H) 04/20/2017 0811   EOSABS 0.2 04/20/2017 0811   BASOSABS 0.1 04/20/2017 0811      Chemistry      Component Value Date/Time   NA 141 03/22/2017 1004   K 5.1 03/22/2017 1004   CL 107 07/24/2016 0617   CO2 22 03/22/2017 1004   BUN 27.4 (H) 03/22/2017 1004   CREATININE 1.6 (H) 03/22/2017 1004      Component Value Date/Time   CALCIUM 9.2 03/22/2017 1004   ALKPHOS 158 (H) 03/10/2017 1009   AST 24 03/10/2017 1009   ALT 15 03/10/2017 1009   BILITOT 0.48 03/10/2017 1009      Impression and Plan:  66 year old gentleman with the following issues:  1. Non-Hodgkin's lymphoma presented with massive splenomegaly and extensive upper abdominal lymphadenopathy with a diagnosis confirmed in July of 2014 is likely mantle cell lymphoma. He is status post 5 cycles of chemotherapy.   PET/CT scan on 03/07/2015 showed no evidence of disease relapse indicating complete response.   CT scan on 03/19/2017 showed recurrent disease at this time.   He started Ibrutinib at 560 mg daily and taken for the last 3 weeks. He denies any complications related to this medication. He is able to get it and take it daily without issues.  His CBC was reviewed today and did show  increase in his white cell count and lymphocytes. His hemoglobin and platelets are improving. Given these findings, I will continue on the current dose and schedule and monitor him closely. It is likely the full effect of this medication might take longer but also the possibility of resistance exists. He understands all of his chemotherapy may be needed if this medication isn't effective.  2. Diabetes mellitus: Followed by his primary care physician.  3. Renal insufficiency: Related to diabetes. His lisinopril has been withheld labs will be monitored  4. Hyperkalemia: He is not taking any potassium supplements and have decreased potassium rich diet. His potassium was corrected and will be repeated periodically.  3. Followup: In the next 4 weeks.   Zola Button, MD 9/11/20188:38 AM

## 2017-04-20 NOTE — Telephone Encounter (Signed)
Per los mailed patient avs and calender for 10/11 appointments

## 2017-04-22 DIAGNOSIS — R69 Illness, unspecified: Secondary | ICD-10-CM | POA: Diagnosis not present

## 2017-04-22 DIAGNOSIS — Z8709 Personal history of other diseases of the respiratory system: Secondary | ICD-10-CM | POA: Diagnosis not present

## 2017-04-22 DIAGNOSIS — Z23 Encounter for immunization: Secondary | ICD-10-CM | POA: Diagnosis not present

## 2017-05-11 ENCOUNTER — Other Ambulatory Visit: Payer: Self-pay | Admitting: Oncology

## 2017-05-11 DIAGNOSIS — C8317 Mantle cell lymphoma, spleen: Secondary | ICD-10-CM

## 2017-05-11 MED FILL — IMBRUVICA 560 MG TAB: 560 | 28 days supply | Qty: 28 | Fill #0

## 2017-05-20 ENCOUNTER — Other Ambulatory Visit (HOSPITAL_BASED_OUTPATIENT_CLINIC_OR_DEPARTMENT_OTHER): Payer: Medicare HMO

## 2017-05-20 ENCOUNTER — Ambulatory Visit (HOSPITAL_BASED_OUTPATIENT_CLINIC_OR_DEPARTMENT_OTHER): Payer: Medicare HMO | Admitting: Oncology

## 2017-05-20 ENCOUNTER — Telehealth: Payer: Self-pay

## 2017-05-20 VITALS — BP 132/49 | HR 77 | Temp 98.4°F | Resp 20 | Ht 73.0 in | Wt 247.5 lb

## 2017-05-20 DIAGNOSIS — C8317 Mantle cell lymphoma, spleen: Secondary | ICD-10-CM

## 2017-05-20 DIAGNOSIS — C8597 Non-Hodgkin lymphoma, unspecified, spleen: Secondary | ICD-10-CM

## 2017-05-20 DIAGNOSIS — R0609 Other forms of dyspnea: Secondary | ICD-10-CM

## 2017-05-20 DIAGNOSIS — R140 Abdominal distension (gaseous): Secondary | ICD-10-CM

## 2017-05-20 DIAGNOSIS — N289 Disorder of kidney and ureter, unspecified: Secondary | ICD-10-CM

## 2017-05-20 DIAGNOSIS — E119 Type 2 diabetes mellitus without complications: Secondary | ICD-10-CM | POA: Diagnosis not present

## 2017-05-20 LAB — CBC WITH DIFFERENTIAL/PLATELET
BASO%: 0.1 % (ref 0.0–2.0)
BASOS ABS: 0.1 10*3/uL (ref 0.0–0.1)
EOS%: 0.4 % (ref 0.0–7.0)
Eosinophils Absolute: 0.3 10*3/uL (ref 0.0–0.5)
HEMATOCRIT: 44.3 % (ref 38.4–49.9)
HGB: 13.9 g/dL (ref 13.0–17.1)
LYMPH%: 85.9 % — AB (ref 14.0–49.0)
MCH: 28.4 pg (ref 27.2–33.4)
MCHC: 31.3 g/dL — AB (ref 32.0–36.0)
MCV: 90.6 fL (ref 79.3–98.0)
MONO#: 1.7 10*3/uL — AB (ref 0.1–0.9)
MONO%: 2.2 % (ref 0.0–14.0)
NEUT#: 8.8 10*3/uL — ABNORMAL HIGH (ref 1.5–6.5)
NEUT%: 11.4 % — AB (ref 39.0–75.0)
PLATELETS: 139 10*3/uL — AB (ref 140–400)
RBC: 4.89 10*6/uL (ref 4.20–5.82)
RDW: 17.1 % — ABNORMAL HIGH (ref 11.0–14.6)
WBC: 77 10*3/uL (ref 4.0–10.3)
lymph#: 66.1 10*3/uL — ABNORMAL HIGH (ref 0.9–3.3)

## 2017-05-20 LAB — COMPREHENSIVE METABOLIC PANEL
ALT: 20 U/L (ref 0–55)
ANION GAP: 9 meq/L (ref 3–11)
AST: 20 U/L (ref 5–34)
Albumin: 3.8 g/dL (ref 3.5–5.0)
Alkaline Phosphatase: 104 U/L (ref 40–150)
BUN: 32.2 mg/dL — ABNORMAL HIGH (ref 7.0–26.0)
CALCIUM: 8.7 mg/dL (ref 8.4–10.4)
CHLORIDE: 104 meq/L (ref 98–109)
CO2: 24 meq/L (ref 22–29)
CREATININE: 1.4 mg/dL — AB (ref 0.7–1.3)
EGFR: 52 mL/min/{1.73_m2} — AB (ref >60)
Glucose: 314 mg/dl — ABNORMAL HIGH (ref 70–140)
Sodium: 136 mEq/L (ref 136–145)
Total Bilirubin: 0.72 mg/dL (ref 0.20–1.20)
Total Protein: 6.5 g/dL (ref 6.4–8.3)

## 2017-05-20 LAB — TECHNOLOGIST REVIEW

## 2017-05-20 NOTE — Telephone Encounter (Signed)
Printed avs and calender for upcoming appointment. Per 10/11 los 

## 2017-05-20 NOTE — Progress Notes (Signed)
Hematology and Oncology Follow Up Visit  Micheal Thompson [id number] [birth year] 66 y.o. 05/20/2017 1:07 PM   Principle Diagnosis: 66 year old gentleman  presented with splenomegaly and lymphadenopathy. His workup showed mantle cell lymphoma diagnosed in July of 2014. He presented with lymphocytosis.  Prior therapy: Chemotherapy with  bendamustine and rituximab started on 02/28/2013. He is S/P 5 cycles completed in 07/2013. He achieved complete response at that time.  Current therapy: Ibrutinib 560 mg daily started in 03/2017.  Interim History: Micheal Thompson presents today for a followup visit. Since his visit, he reports doing well overall. He continues to take Ibrutinib and reports no complications. He did reports mild bleeding skin ulcers that are healing well at this time. He does report easy bruising. His appetite and performance status is back to baseline. He has gained weight since the last visit. He denied any fevers, chills, sweats. He denied any abdominal pain, early satiety or constitutional symptoms. He remains active and continues to attend to her activities of daily living.   He does not report any headaches or blurry vision or double vision. Does not report any syncope. Does not report any chest pain does report some occasional exertional dyspnea. Does not report any cough or hemoptysis. Report any nausea or vomiting or hematochezia. Does report some abdominal distention as mentioned. Does not report any frequency urgency or hesitancy. Does not report any lymphadenopathy or petechiae. His rest of review of systems unremarkable.  Medications: I have reviewed the patient's current medications.  Current Outpatient Prescriptions  Medication Sig Dispense Refill  . aspirin 81 MG tablet Take 81 mg by mouth at bedtime.     Marland Kitchen atorvastatin (LIPITOR) 80 MG tablet Take 80 mg by mouth every evening.     . B Complex-C (B-COMPLEX WITH VITAMIN C) tablet Take 1 tablet by mouth daily.    . ferrous sulfate  325 (65 FE) MG EC tablet Take 325 mg by mouth daily with breakfast.    . IMBRUVICA 560 MG TABS TAKE 1 TABLET BY MOUTH DAILY. 28 tablet 0  . insulin regular human CONCENTRATED (HUMULIN R) 500 UNIT/ML SOLN injection Inject 6-16 Units into the skin 3 (three) times daily with meals. 12/14:  Pt with vial & drawing up in a U-100 syringe:  6 units (= 30 units)  in the morning, 16 units (= 80 units) lunch, 10 units (= 50 units) at night. May adjust the 10 unit (50 unit) night dose using sliding scale as needed.    Marland Kitchen JARDIANCE 25 MG TABS tablet Take 25 mg by mouth at bedtime.     Marland Kitchen lisinopril (PRINIVIL,ZESTRIL) 10 MG tablet Take 10 mg by mouth at bedtime.     . Multiple Vitamins-Minerals (CENTRUM PO) Take by mouth daily.    . Omega-3 Fatty Acids (FISH OIL PO) Take 1 tablet by mouth daily.    Marland Kitchen oxyCODONE (OXY IR/ROXICODONE) 5 MG immediate release tablet Take 1-2 tablets (5-10 mg total) by mouth every 4 (four) hours as needed for moderate pain. (Patient not taking: Reported on 03/22/2017) 40 tablet 0  . sertraline (ZOLOFT) 100 MG tablet Take 100 mg by mouth daily.    . traZODone (DESYREL) 100 MG tablet Take 100 mg by mouth at bedtime.     No current facility-administered medications for this visit.      Allergies:  Allergies  Allergen Reactions  . No Known Allergies Other (See Comments)    Previously listed PCN Allergy not active.   Per Pt interview, "has taken  a number of PCN derivatives" Taken Amoxicillin, Cephalosporins.    Past Medical History, Surgical history, Social history, and Family History were reviewed and updated.    Physical Exam: Blood pressure (!) 132/49, pulse 77, temperature 98.4 F (36.9 C), temperature source Oral, resp. rate 20, height 6\' 1"  (1.854 m), weight 247 lb 8 oz (112.3 kg), SpO2 95 %. ECOG:1 General appearance: Alert, awake gentleman without distress. Head: Normocephalic, without obvious abnormality no oral rash or ulcers. Neck: no adenopathy or masses. Lymph  nodes: Cervical, supraclavicular, and axillary nodes normal. Heart:regular rate and rhythm, S1, S2 normal, no murmur, click, rub or gallop Lung: Scattered expiratory wheezes and rhonchi noted on his examination. Abdomin: Soft, obese. Good bowel sounds. No rebound or guarding. EXT: No edema noted.  CBC    Component Value Date/Time   WBC 77.0 (HH) 05/20/2017 1244   WBC 5.9 07/24/2016 0617   RBC 4.89 05/20/2017 1244   RBC 4.71 07/24/2016 0617   HGB 13.9 05/20/2017 1244   HCT 44.3 05/20/2017 1244   PLT 139 (L) 05/20/2017 1244   MCV 90.6 05/20/2017 1244   MCH 28.4 05/20/2017 1244   MCH 28.9 07/24/2016 0617   MCHC 31.3 (L) 05/20/2017 1244   MCHC 32.2 07/24/2016 0617   RDW 17.1 (H) 05/20/2017 1244   LYMPHSABS 66.1 (H) 05/20/2017 1244   MONOABS 1.7 (H) 05/20/2017 1244   EOSABS 0.3 05/20/2017 1244   BASOSABS 0.1 05/20/2017 1244      Chemistry      Component Value Date/Time   NA 137 04/20/2017 0811   K 4.7 04/20/2017 0811   CL 107 07/24/2016 0617   CO2 20 (L) 04/20/2017 0811   BUN 31.6 (H) 04/20/2017 0811   CREATININE 1.6 (H) 04/20/2017 0811      Component Value Date/Time   CALCIUM 9.4 04/20/2017 0811   ALKPHOS 108 04/20/2017 0811   AST 17 04/20/2017 0811   ALT 9 04/20/2017 0811   BILITOT 0.82 04/20/2017 0811      Impression and Plan:  66 year old gentleman with the following issues:  1. Non-Hodgkin's lymphoma presented with massive splenomegaly and extensive upper abdominal lymphadenopathy with a diagnosis confirmed in July of 2014 is likely mantle cell lymphoma. He is status post 5 cycles of chemotherapy.   PET/CT scan on 03/07/2015 showed no evidence of disease relapse indicating complete response.   CT scan on 03/19/2017 showed recurrent disease at this time.   He started Ibrutinib at 560 mg daily Without complications.  His CBC was reviewed today and and showed improved hematological parameters. His white cell count has decreased by 50% with normalization of his  hemoglobin and platelet count. His lymphocyte percentage is also declining.  Risks and benefits of continuing this medication was reviewed today and is agreeable to continue.  2. Diabetes mellitus: Followed by his primary care physician. Blood sugar appeared adequate.  3. Renal insufficiency: Related to diabetes. His lisinopril has been withheld labs will be monitored  4. Hyperkalemia: Potassium was normal at 4.7 on 04/20/2017. We will continue to monitor this periodically.  5. Cardiac implications: We have discussed potential complication related to this medication. These include arrhythmias and atrial fibrillation. He follows with cardiology periodically and he will continue to do so especially if he develops any signs and symptoms of atrial fibrillation.  6. Followup: In the next 7 weeks.   Zola Button, MD 10/11/20181:07 PM

## 2017-05-27 DIAGNOSIS — E1142 Type 2 diabetes mellitus with diabetic polyneuropathy: Secondary | ICD-10-CM | POA: Diagnosis not present

## 2017-05-27 DIAGNOSIS — E1165 Type 2 diabetes mellitus with hyperglycemia: Secondary | ICD-10-CM | POA: Diagnosis not present

## 2017-05-27 DIAGNOSIS — Z794 Long term (current) use of insulin: Secondary | ICD-10-CM | POA: Diagnosis not present

## 2017-05-27 DIAGNOSIS — L720 Epidermal cyst: Secondary | ICD-10-CM | POA: Diagnosis not present

## 2017-05-27 DIAGNOSIS — L03116 Cellulitis of left lower limb: Secondary | ICD-10-CM | POA: Diagnosis not present

## 2017-05-27 DIAGNOSIS — Z8572 Personal history of non-Hodgkin lymphomas: Secondary | ICD-10-CM | POA: Diagnosis not present

## 2017-05-31 DIAGNOSIS — J449 Chronic obstructive pulmonary disease, unspecified: Secondary | ICD-10-CM | POA: Diagnosis not present

## 2017-05-31 DIAGNOSIS — E119 Type 2 diabetes mellitus without complications: Secondary | ICD-10-CM | POA: Diagnosis not present

## 2017-05-31 DIAGNOSIS — C8590 Non-Hodgkin lymphoma, unspecified, unspecified site: Secondary | ICD-10-CM | POA: Diagnosis not present

## 2017-05-31 DIAGNOSIS — L720 Epidermal cyst: Secondary | ICD-10-CM | POA: Diagnosis not present

## 2017-05-31 DIAGNOSIS — I10 Essential (primary) hypertension: Secondary | ICD-10-CM | POA: Diagnosis not present

## 2017-06-04 ENCOUNTER — Other Ambulatory Visit: Payer: Self-pay | Admitting: Oncology

## 2017-06-04 DIAGNOSIS — C8317 Mantle cell lymphoma, spleen: Secondary | ICD-10-CM

## 2017-06-15 MED FILL — IMBRUVICA 560 MG TAB: 560 | 28 days supply | Qty: 28 | Fill #0

## 2017-06-22 ENCOUNTER — Telehealth: Payer: Self-pay | Admitting: Pharmacist

## 2017-06-22 DIAGNOSIS — C8317 Mantle cell lymphoma, spleen: Secondary | ICD-10-CM

## 2017-06-22 NOTE — Telephone Encounter (Signed)
Oral Chemotherapy Pharmacist Encounter  Follow-Up Form  Called patient today to follow up regarding patient's oral chemotherapy medication: Imbruvica (irbrutinib) for the treatment of recurrent mantle cell lymphoma, planned duration until disease progression or unacceptable toxicity.  Original Start date of oral chemotherapy: 03/26/17  Pt is doing well today  Pt reports 0 tablets/doses of Imbruvica 560mg  tablets, 1 tablet by mouth once daily with a full glass of water missed in the last month.  Patient takes his Imbruvica daily after breakfast  Pt reports the following side effects:   Bruising and blood blisters on his hands. Every cut he gets will bleed. He also experiences fatigue, which is improving. Patient states he is a former [employer] and will go to the ED with bleeding that does not cease after 10 minutes. He has stopped his aspirin and fish oil as directed back in August. These have been removed from his medication list.  Pertinent labs reviewed: OK for treatment, to be repeated at the end of the month.  Patient knows to call the office with questions or concerns. Oral Oncology Clinic will continue to follow.  Thank you,  Johny Drilling, PharmD, BCPS, BCOP 06/22/2017 3:47 PM Oral Oncology Clinic [phone]

## 2017-07-06 ENCOUNTER — Other Ambulatory Visit: Payer: Self-pay | Admitting: Oncology

## 2017-07-06 DIAGNOSIS — C8317 Mantle cell lymphoma, spleen: Secondary | ICD-10-CM

## 2017-07-08 MED FILL — IMBRUVICA 560 MG TAB: 560 | 28 days supply | Qty: 28 | Fill #0

## 2017-07-09 ENCOUNTER — Ambulatory Visit (HOSPITAL_BASED_OUTPATIENT_CLINIC_OR_DEPARTMENT_OTHER): Payer: Medicare HMO | Admitting: Oncology

## 2017-07-09 ENCOUNTER — Other Ambulatory Visit (HOSPITAL_BASED_OUTPATIENT_CLINIC_OR_DEPARTMENT_OTHER): Payer: Medicare HMO

## 2017-07-09 ENCOUNTER — Telehealth: Payer: Self-pay | Admitting: Oncology

## 2017-07-09 VITALS — BP 143/64 | HR 65 | Temp 98.2°F | Resp 17 | Ht 73.0 in | Wt 243.0 lb

## 2017-07-09 DIAGNOSIS — C8597 Non-Hodgkin lymphoma, unspecified, spleen: Secondary | ICD-10-CM | POA: Diagnosis not present

## 2017-07-09 DIAGNOSIS — D649 Anemia, unspecified: Secondary | ICD-10-CM | POA: Diagnosis not present

## 2017-07-09 DIAGNOSIS — N289 Disorder of kidney and ureter, unspecified: Secondary | ICD-10-CM | POA: Diagnosis not present

## 2017-07-09 DIAGNOSIS — E119 Type 2 diabetes mellitus without complications: Secondary | ICD-10-CM

## 2017-07-09 DIAGNOSIS — C8317 Mantle cell lymphoma, spleen: Secondary | ICD-10-CM

## 2017-07-09 LAB — COMPREHENSIVE METABOLIC PANEL
ALK PHOS: 123 U/L (ref 40–150)
ALT: 19 U/L (ref 0–55)
AST: 25 U/L (ref 5–34)
Albumin: 4.2 g/dL (ref 3.5–5.0)
Anion Gap: 10 mEq/L (ref 3–11)
BUN: 27 mg/dL — ABNORMAL HIGH (ref 7.0–26.0)
CO2: 24 meq/L (ref 22–29)
Calcium: 9 mg/dL (ref 8.4–10.4)
Chloride: 104 mEq/L (ref 98–109)
Creatinine: 1.4 mg/dL — ABNORMAL HIGH (ref 0.7–1.3)
EGFR: 51 mL/min/{1.73_m2} — AB (ref >60)
GLUCOSE: 298 mg/dL — AB (ref 70–140)
SODIUM: 137 meq/L (ref 136–145)
Total Bilirubin: 0.75 mg/dL (ref 0.20–1.20)
Total Protein: 7.2 g/dL (ref 6.4–8.3)

## 2017-07-09 LAB — CBC WITH DIFFERENTIAL/PLATELET
BASO%: 0.2 % (ref 0.0–2.0)
BASOS ABS: 0.1 10*3/uL (ref 0.0–0.1)
EOS%: 0.4 % (ref 0.0–7.0)
Eosinophils Absolute: 0.2 10*3/uL (ref 0.0–0.5)
HEMATOCRIT: 50.9 % — AB (ref 38.4–49.9)
HGB: 15.8 g/dL (ref 13.0–17.1)
LYMPH#: 44.4 10*3/uL — AB (ref 0.9–3.3)
LYMPH%: 78.8 % — ABNORMAL HIGH (ref 14.0–49.0)
MCH: 27.9 pg (ref 27.2–33.4)
MCHC: 31.1 g/dL — AB (ref 32.0–36.0)
MCV: 89.5 fL (ref 79.3–98.0)
MONO#: 1.4 10*3/uL — AB (ref 0.1–0.9)
MONO%: 2.5 % (ref 0.0–14.0)
NEUT#: 10.2 10*3/uL — ABNORMAL HIGH (ref 1.5–6.5)
NEUT%: 18.1 % — AB (ref 39.0–75.0)
PLATELETS: 151 10*3/uL (ref 140–400)
RBC: 5.68 10*6/uL (ref 4.20–5.82)
RDW: 15.6 % — ABNORMAL HIGH (ref 11.0–14.6)
WBC: 56.3 10*3/uL (ref 4.0–10.3)

## 2017-07-09 LAB — TECHNOLOGIST REVIEW

## 2017-07-09 NOTE — Progress Notes (Signed)
Hematology and Oncology Follow Up Visit  Micheal Thompson [id number] 1951-06-02 66 y.o. 07/09/2017 10:40 AM   Principle Diagnosis: 66 year old gentleman  presented with splenomegaly and lymphadenopathy. His workup showed mantle cell lymphoma diagnosed in July of 2014. He presented with lymphocytosis.  Prior therapy: Chemotherapy with  bendamustine and rituximab started on 02/28/2013. He is S/P 5 cycles completed in 07/2013. He achieved complete response at that time.  Current therapy: Ibrutinib 560 mg daily started in 03/2017.  Interim History: Micheal Thompson presents today for a followup visit. Since his visit, he reports no major changes in his health.  He was diagnosed with cellulitis of his lower extremity and was treated with doxycycline.  He reports cellulitis appears to have resolved.    He continues to take Ibrutinib and reports no complications. He did reports mild bleeding skin ulcers that are not changed. He does report easy bruising. His appetite and performance status is back to baseline. He has gained weight since the last visit. He denied any fevers, chills, sweats. He denied any abdominal pain, early satiety or constitutional symptoms.  He denies any palpitation or dyspnea on exertion.   He does not report any headaches or blurry vision or double vision. Does not report any syncope. Does not report any chest pain. Does not report any cough or hemoptysis. Report any nausea or vomiting or hematochezia. Does report some abdominal distention as mentioned. Does not report any frequency urgency or hesitancy. Does not report any lymphadenopathy or petechiae. His rest of review of systems unremarkable.  Medications: I have reviewed the patient's current medications.  Current Outpatient Medications  Medication Sig Dispense Refill  . atorvastatin (LIPITOR) 80 MG tablet Take 80 mg by mouth every evening.     . B Complex-C (B-COMPLEX WITH VITAMIN C) tablet Take 1 tablet by mouth daily.    .  ferrous sulfate 325 (65 FE) MG EC tablet Take 325 mg by mouth daily with breakfast.    . IMBRUVICA 560 MG TABS TAKE 1 TABLET BY MOUTH DAILY 28 tablet 0  . insulin regular human CONCENTRATED (HUMULIN R) 500 UNIT/ML SOLN injection Inject 6-16 Units into the skin 3 (three) times daily with meals. 12/14:  Pt with vial & drawing up in a U-100 syringe:  6 units (= 30 units)  in the morning, 16 units (= 80 units) lunch, 10 units (= 50 units) at night. May adjust the 10 unit (50 unit) night dose using sliding scale as needed.    Marland Kitchen JARDIANCE 25 MG TABS tablet Take 25 mg by mouth at bedtime.     Marland Kitchen lisinopril (PRINIVIL,ZESTRIL) 10 MG tablet Take 10 mg by mouth at bedtime.     . Multiple Vitamins-Minerals (CENTRUM PO) Take by mouth daily.    Marland Kitchen oxyCODONE (OXY IR/ROXICODONE) 5 MG immediate release tablet Take 1-2 tablets (5-10 mg total) by mouth every 4 (four) hours as needed for moderate pain. (Patient not taking: Reported on 03/22/2017) 40 tablet 0  . sertraline (ZOLOFT) 100 MG tablet Take 100 mg by mouth daily.    . traZODone (DESYREL) 100 MG tablet Take 100 mg by mouth at bedtime.     No current facility-administered medications for this visit.      Allergies:  Allergies  Allergen Reactions  . No Known Allergies Other (See Comments)    Previously listed PCN Allergy not active.   Per Pt interview, "has taken a number of PCN derivatives" Taken Amoxicillin, Cephalosporins.    Past Medical History, Surgical history,  Social history, and Family History were reviewed and updated.    Physical Exam: Blood pressure (!) 143/64, pulse 65, temperature 98.2 F (36.8 C), temperature source Oral, resp. rate 17, height 6\' 1"  (1.854 m), weight 243 lb (110.2 kg), SpO2 98 %. ECOG:1 General appearance: Well-appearing gentleman without distress. Head: Normocephalic, without obvious abnormality no oral ulcers or lesions. Neck: no adenopathy or masses. Lymph nodes: Cervical, supraclavicular, and axillary nodes  normal. Heart:regular rate and rhythm, S1, S2 normal, no murmur, click, rub or gallop Lung: Scattered expiratory wheezes and rhonchi noted on his examination. Abdomin: Soft, obese.  No splenomegaly palpated.  No rebound or guarding. EXT: No edema noted.  CBC    Component Value Date/Time   WBC 56.3 (HH) 07/09/2017 1014   WBC 5.9 07/24/2016 0617   RBC 5.68 07/09/2017 1014   RBC 4.71 07/24/2016 0617   HGB 15.8 07/09/2017 1014   HCT 50.9 (H) 07/09/2017 1014   PLT 151 07/09/2017 1014   MCV 89.5 07/09/2017 1014   MCH 27.9 07/09/2017 1014   MCH 28.9 07/24/2016 0617   MCHC 31.1 (L) 07/09/2017 1014   MCHC 32.2 07/24/2016 0617   RDW 15.6 (H) 07/09/2017 1014   LYMPHSABS 44.4 (H) 07/09/2017 1014   MONOABS 1.4 (H) 07/09/2017 1014   EOSABS 0.2 07/09/2017 1014   BASOSABS 0.1 07/09/2017 1014      Chemistry      Component Value Date/Time   NA 136 05/20/2017 1244   K 5.7 No visable hemolysis (H) 05/20/2017 1244   CL 107 07/24/2016 0617   CO2 24 05/20/2017 1244   BUN 32.2 (H) 05/20/2017 1244   CREATININE 1.4 (H) 05/20/2017 1244      Component Value Date/Time   CALCIUM 8.7 05/20/2017 1244   ALKPHOS 104 05/20/2017 1244   AST 20 05/20/2017 1244   ALT 20 05/20/2017 1244   BILITOT 0.72 05/20/2017 1244      Impression and Plan:  66 year old gentleman with the following issues:  1. Non-Hodgkin's lymphoma presented with massive splenomegaly and extensive upper abdominal lymphadenopathy with a diagnosis confirmed in July of 2014 is likely mantle cell lymphoma. He is status post 5 cycles of chemotherapy.   PET/CT scan on 03/07/2015 showed no evidence of disease relapse indicating complete response.   CT scan on 03/19/2017 showed recurrent disease at this time.   He started Ibrutinib at 560 mg daily Without complications.  His CBC was reviewed today and continues to show improvement in his white cell count.  His hemoglobin and platelets are normalized.  Risks and benefits of continuing  this medication was reviewed today and is agreeable to continue.  2. Diabetes mellitus: Followed by his primary care physician. Blood sugar appeared adequate.  3. Renal insufficiency: Related to diabetes. His lisinopril has been withheld labs will be monitored  4. Hyperkalemia: Potassium will be repeated today.  5. Cardiac implications: We have discussed potential complication related to this medication. These include arrhythmias and atrial fibrillation. He follows with cardiology periodically and he will continue to do so especially if he develops any signs and symptoms of atrial fibrillation.  6. Followup: In 12 weeks to follow his progress.  Zola Button, MD 11/30/201810:40 AM

## 2017-07-09 NOTE — Telephone Encounter (Signed)
Scheduled appt per 11/30 los - Gave patient AVS and calender per los.  

## 2017-08-05 ENCOUNTER — Other Ambulatory Visit: Payer: Self-pay | Admitting: Oncology

## 2017-08-05 DIAGNOSIS — C8317 Mantle cell lymphoma, spleen: Secondary | ICD-10-CM

## 2017-08-06 MED FILL — IMBRUVICA 560 MG TAB: 560 | 28 days supply | Qty: 28 | Fill #0

## 2017-08-13 ENCOUNTER — Other Ambulatory Visit (HOSPITAL_BASED_OUTPATIENT_CLINIC_OR_DEPARTMENT_OTHER): Payer: Medicare HMO

## 2017-08-13 ENCOUNTER — Ambulatory Visit: Payer: Medicare HMO | Admitting: Oncology

## 2017-08-13 ENCOUNTER — Telehealth: Payer: Self-pay | Admitting: Oncology

## 2017-08-13 VITALS — BP 155/71 | HR 80 | Temp 98.6°F | Resp 16 | Wt 245.3 lb

## 2017-08-13 DIAGNOSIS — N289 Disorder of kidney and ureter, unspecified: Secondary | ICD-10-CM | POA: Diagnosis not present

## 2017-08-13 DIAGNOSIS — E119 Type 2 diabetes mellitus without complications: Secondary | ICD-10-CM

## 2017-08-13 DIAGNOSIS — C8317 Mantle cell lymphoma, spleen: Secondary | ICD-10-CM

## 2017-08-13 DIAGNOSIS — E875 Hyperkalemia: Secondary | ICD-10-CM

## 2017-08-13 DIAGNOSIS — C8597 Non-Hodgkin lymphoma, unspecified, spleen: Secondary | ICD-10-CM

## 2017-08-13 DIAGNOSIS — R1110 Vomiting, unspecified: Secondary | ICD-10-CM | POA: Diagnosis not present

## 2017-08-13 LAB — COMPREHENSIVE METABOLIC PANEL WITH GFR
ALT: 21 U/L (ref 0–55)
AST: 21 U/L (ref 5–34)
Albumin: 4.1 g/dL (ref 3.5–5.0)
Alkaline Phosphatase: 109 U/L (ref 40–150)
Anion Gap: 9 meq/L (ref 3–11)
BUN: 28.7 mg/dL — ABNORMAL HIGH (ref 7.0–26.0)
CO2: 22 meq/L (ref 22–29)
Calcium: 8.8 mg/dL (ref 8.4–10.4)
Chloride: 104 meq/L (ref 98–109)
Creatinine: 1.5 mg/dL — ABNORMAL HIGH (ref 0.7–1.3)
EGFR: 48 ml/min/1.73 m2 — ABNORMAL LOW
Glucose: 334 mg/dL — ABNORMAL HIGH (ref 70–140)
Potassium: 5 meq/L (ref 3.5–5.1)
Sodium: 135 meq/L — ABNORMAL LOW (ref 136–145)
Total Bilirubin: 1.44 mg/dL — ABNORMAL HIGH (ref 0.20–1.20)
Total Protein: 7 g/dL (ref 6.4–8.3)

## 2017-08-13 LAB — CBC WITH DIFFERENTIAL/PLATELET
BASO%: 0.2 % (ref 0.0–2.0)
Basophils Absolute: 0.1 10e3/uL (ref 0.0–0.1)
EOS%: 0.3 % (ref 0.0–7.0)
Eosinophils Absolute: 0.1 10e3/uL (ref 0.0–0.5)
HCT: 50.8 % — ABNORMAL HIGH (ref 38.4–49.9)
HGB: 16.2 g/dL (ref 13.0–17.1)
LYMPH%: 71.2 % — ABNORMAL HIGH (ref 14.0–49.0)
MCH: 28.4 pg (ref 27.2–33.4)
MCHC: 31.9 g/dL — ABNORMAL LOW (ref 32.0–36.0)
MCV: 89 fL (ref 79.3–98.0)
MONO#: 1.8 10e3/uL — ABNORMAL HIGH (ref 0.1–0.9)
MONO%: 3.6 % (ref 0.0–14.0)
NEUT#: 12.2 10e3/uL — ABNORMAL HIGH (ref 1.5–6.5)
NEUT%: 24.7 % — ABNORMAL LOW (ref 39.0–75.0)
Platelets: 160 10e3/uL (ref 140–400)
RBC: 5.71 10e6/uL (ref 4.20–5.82)
RDW: 15.2 % — ABNORMAL HIGH (ref 11.0–14.6)
WBC: 49.3 10e3/uL — ABNORMAL HIGH (ref 4.0–10.3)
lymph#: 35.1 10e3/uL — ABNORMAL HIGH (ref 0.9–3.3)

## 2017-08-13 LAB — TECHNOLOGIST REVIEW

## 2017-08-13 NOTE — Telephone Encounter (Signed)
Gave avs and calendar for February  °

## 2017-08-13 NOTE — Progress Notes (Signed)
Hematology and Oncology Follow Up Visit  STOKES RATTIGAN [id number] September 29, 1950 67 y.o. 08/13/2017 1:06 PM   Principle Diagnosis: 67 year old gentleman  presented with splenomegaly and lymphadenopathy. His workup showed mantle cell lymphoma diagnosed in July of 2014. He presented with lymphocytosis.  Prior therapy: Chemotherapy with  bendamustine and rituximab started on 02/28/2013. He is S/P 5 cycles completed in 07/2013. He achieved complete response at that time.  Current therapy: Ibrutinib 560 mg daily started in 03/2017.  Interim History: Mr. Micheal Thompson presents today for a followup visit. Since his visit, he reports feeling well overall. He does report periodic episodes of vomiting without warning or nausea associated with it. This has not happened last 4-5 days and is unclear of the etiology. He still able to eat and moves his bowels regularly. He denied any excessive fatigue or tiredness. He continues to perform activities of daily living without any decline.  He continues to take Ibrutinib and reports no complications.He does report easy bruising which has not changed He has gained weight since the last visit. He denied any fevers, chills, sweats. He denied any abdominal pain, early satiety or constitutional symptoms.  He denies any palpitation or dyspnea on exertion.   He does not report any headaches or blurry vision or double vision. Does not report any syncope. Does not report any chest pain. Does not report any cough or hemoptysis. Report any nausea or vomiting or hematochezia. Does report some abdominal distention as mentioned. Does not report any frequency urgency or hesitancy. Does not report any lymphadenopathy or petechiae. His rest of review of systems unremarkable.  Medications: I have reviewed the patient's current medications.  Current Outpatient Medications  Medication Sig Dispense Refill  . atorvastatin (LIPITOR) 80 MG tablet Take 80 mg by mouth every evening.     . B Complex-C  (B-COMPLEX WITH VITAMIN C) tablet Take 1 tablet by mouth daily.    . ferrous sulfate 325 (65 FE) MG EC tablet Take 325 mg by mouth daily with breakfast.    . IMBRUVICA 560 MG TABS TAKE 1 TABLET BY MOUTH DAILY 28 tablet 0  . insulin regular human CONCENTRATED (HUMULIN R) 500 UNIT/ML SOLN injection Inject 6-16 Units into the skin 3 (three) times daily with meals. 12/14:  Pt with vial & drawing up in a U-100 syringe:  6 units (= 30 units)  in the morning, 16 units (= 80 units) lunch, 10 units (= 50 units) at night. May adjust the 10 unit (50 unit) night dose using sliding scale as needed.    Marland Kitchen JARDIANCE 25 MG TABS tablet Take 25 mg by mouth at bedtime.     Marland Kitchen lisinopril (PRINIVIL,ZESTRIL) 10 MG tablet Take 10 mg by mouth at bedtime.     . Multiple Vitamins-Minerals (CENTRUM PO) Take by mouth daily.    Marland Kitchen oxyCODONE (OXY IR/ROXICODONE) 5 MG immediate release tablet Take 1-2 tablets (5-10 mg total) by mouth every 4 (four) hours as needed for moderate pain. (Patient not taking: Reported on 03/22/2017) 40 tablet 0  . sertraline (ZOLOFT) 100 MG tablet Take 100 mg by mouth daily.    . traZODone (DESYREL) 100 MG tablet Take 100 mg by mouth at bedtime.     No current facility-administered medications for this visit.      Allergies:  Allergies  Allergen Reactions  . No Known Allergies Other (See Comments)    Previously listed PCN Allergy not active.   Per Pt interview, "has taken a number of PCN derivatives" Taken  Amoxicillin, Cephalosporins.    Past Medical History, Surgical history, Social history, and Family History were reviewed and updated.    Physical Exam: Blood pressure (!) 155/71, pulse 80, temperature 98.6 F (37 C), temperature source Oral, resp. rate 16, weight 245 lb 5 oz (111.3 kg), SpO2 97 %. ECOG:1 General appearance: Alert, awake gentleman without distress. Head: Normocephalic, without obvious abnormality no oral thrush. Neck: no adenopathy or masses. Lymph nodes: Cervical,  supraclavicular, and axillary nodes normal. Heart:regular rate and rhythm, S1, S2 normal, no murmur, click, rub or gallop Lung: Scattered expiratory wheezes and rhonchi noted on his examination. Abdomin: Soft, obese.  No splenomegaly palpated.  No shifting dullness or ascites. EXT: No edema noted.  CBC    Component Value Date/Time   WBC 49.3 (H) 08/13/2017 1236   WBC 5.9 07/24/2016 0617   RBC 5.71 08/13/2017 1236   RBC 4.71 07/24/2016 0617   HGB 16.2 08/13/2017 1236   HCT 50.8 (H) 08/13/2017 1236   PLT 160 08/13/2017 1236   MCV 89.0 08/13/2017 1236   MCH 28.4 08/13/2017 1236   MCH 28.9 07/24/2016 0617   MCHC 31.9 (L) 08/13/2017 1236   MCHC 32.2 07/24/2016 0617   RDW 15.2 (H) 08/13/2017 1236   LYMPHSABS 35.1 (H) 08/13/2017 1236   MONOABS 1.8 (H) 08/13/2017 1236   EOSABS 0.1 08/13/2017 1236   BASOSABS 0.1 08/13/2017 1236      Chemistry      Component Value Date/Time   NA 137 07/09/2017 1014   K 5.2 No visable hemolysis (H) 07/09/2017 1014   CL 107 07/24/2016 0617   CO2 24 07/09/2017 1014   BUN 27.0 (H) 07/09/2017 1014   CREATININE 1.4 (H) 07/09/2017 1014      Component Value Date/Time   CALCIUM 9.0 07/09/2017 1014   ALKPHOS 123 07/09/2017 1014   AST 25 07/09/2017 1014   ALT 19 07/09/2017 1014   BILITOT 0.75 07/09/2017 1014      Impression and Plan:  67 year old gentleman with the following issues:  1. Non-Hodgkin's lymphoma presented with massive splenomegaly and extensive upper abdominal lymphadenopathy with a diagnosis confirmed in July of 2014 is likely mantle cell lymphoma. He is status post 5 cycles of chemotherapy.   PET/CT scan on 03/07/2015 showed no evidence of disease relapse indicating complete response.   CT scan on 03/19/2017 showed recurrent disease at this time.   He started Ibrutinib at 560 mg daily and has tolerated it well without complications.  His CBC was reviewed today and continues to show improvement in his white cell count.  His  hemoglobin and platelets are normalized.  The plan is to continue with the same dose and schedule and repeat CT scan the next few weeks for staging purposes.  2. Diabetes mellitus: Followed by his primary care physician. Blood sugar appeared adequate.  3. Renal insufficiency: Related to diabetes. His lisinopril has been withheld labs will be monitored  4. Hyperkalemia: His potassium has been slightly elevated although for the most part normal  5. Cardiac implications: We have discussed potential complication related to this medication. These include arrhythmias and atrial fibrillation. He follows with cardiology periodically and he will continue to do so especially if he develops any signs and symptoms of atrial fibrillation.  6. Episodic vomiting: Unclear etiology. Will obtain CT scan of the abdomen to rule out mechanical obstruction.  7. Followup: In next 8 weeks to follow her progress.  Zola Button, MD 1/4/20191:06 PM

## 2017-08-18 ENCOUNTER — Other Ambulatory Visit: Payer: Self-pay | Admitting: Pharmacist

## 2017-08-25 ENCOUNTER — Other Ambulatory Visit: Payer: Self-pay | Admitting: Oncology

## 2017-08-25 DIAGNOSIS — C8317 Mantle cell lymphoma, spleen: Secondary | ICD-10-CM

## 2017-09-03 ENCOUNTER — Ambulatory Visit (HOSPITAL_COMMUNITY)
Admission: RE | Admit: 2017-09-03 | Discharge: 2017-09-03 | Disposition: A | Discharge status: Home / Self Care | Payer: Medicare HMO | Source: Ambulatory Visit | Attending: Oncology | Admitting: Oncology

## 2017-09-03 DIAGNOSIS — N400 Enlarged prostate without lower urinary tract symptoms: Secondary | ICD-10-CM | POA: Diagnosis not present

## 2017-09-03 DIAGNOSIS — R1110 Vomiting, unspecified: Secondary | ICD-10-CM | POA: Diagnosis not present

## 2017-09-03 DIAGNOSIS — R161 Splenomegaly, not elsewhere classified: Secondary | ICD-10-CM | POA: Diagnosis not present

## 2017-09-03 DIAGNOSIS — C8317 Mantle cell lymphoma, spleen: Secondary | ICD-10-CM | POA: Insufficient documentation

## 2017-09-03 DIAGNOSIS — I2510 Atherosclerotic heart disease of native coronary artery without angina pectoris: Secondary | ICD-10-CM | POA: Diagnosis not present

## 2017-09-03 DIAGNOSIS — J479 Bronchiectasis, uncomplicated: Secondary | ICD-10-CM | POA: Diagnosis not present

## 2017-09-03 DIAGNOSIS — I700 Atherosclerosis of aorta: Secondary | ICD-10-CM | POA: Diagnosis not present

## 2017-09-03 DIAGNOSIS — J984 Other disorders of lung: Secondary | ICD-10-CM | POA: Diagnosis not present

## 2017-09-03 DIAGNOSIS — K5730 Diverticulosis of large intestine without perforation or abscess without bleeding: Secondary | ICD-10-CM | POA: Insufficient documentation

## 2017-09-03 MED ORDER — IOPAMIDOL (ISOVUE-300) INJECTION 61%
100.0000 mL | Freq: Once | INTRAVENOUS | Status: AC | PRN
  Administered 2017-09-03: 100 mL via INTRAVENOUS

## 2017-09-03 MED ORDER — IOPAMIDOL (ISOVUE-300) INJECTION 61%
INTRAVENOUS | Status: AC
  Filled 2017-09-03: qty 100

## 2017-09-10 MED FILL — IMBRUVICA 560 MG TAB: 560 | 28 days supply | Qty: 28 | Fill #0

## 2017-10-01 DIAGNOSIS — E872 Acidosis: Secondary | ICD-10-CM | POA: Diagnosis not present

## 2017-10-01 DIAGNOSIS — J449 Chronic obstructive pulmonary disease, unspecified: Secondary | ICD-10-CM | POA: Diagnosis not present

## 2017-10-01 DIAGNOSIS — E874 Mixed disorder of acid-base balance: Secondary | ICD-10-CM | POA: Diagnosis not present

## 2017-10-01 DIAGNOSIS — R000 Tachycardia, unspecified: Secondary | ICD-10-CM | POA: Diagnosis not present

## 2017-10-01 DIAGNOSIS — Z8572 Personal history of non-Hodgkin lymphomas: Secondary | ICD-10-CM | POA: Diagnosis not present

## 2017-10-01 DIAGNOSIS — J1008 Influenza due to other identified influenza virus with other specified pneumonia: Secondary | ICD-10-CM | POA: Diagnosis not present

## 2017-10-01 DIAGNOSIS — J1100 Influenza due to unidentified influenza virus with unspecified type of pneumonia: Secondary | ICD-10-CM | POA: Diagnosis not present

## 2017-10-01 DIAGNOSIS — J181 Lobar pneumonia, unspecified organism: Secondary | ICD-10-CM | POA: Diagnosis not present

## 2017-10-01 DIAGNOSIS — J09X1 Influenza due to identified novel influenza A virus with pneumonia: Secondary | ICD-10-CM | POA: Diagnosis not present

## 2017-10-01 DIAGNOSIS — R748 Abnormal levels of other serum enzymes: Secondary | ICD-10-CM | POA: Diagnosis not present

## 2017-10-01 DIAGNOSIS — E1010 Type 1 diabetes mellitus with ketoacidosis without coma: Secondary | ICD-10-CM | POA: Diagnosis not present

## 2017-10-01 DIAGNOSIS — I700 Atherosclerosis of aorta: Secondary | ICD-10-CM | POA: Diagnosis not present

## 2017-10-01 DIAGNOSIS — R0682 Tachypnea, not elsewhere classified: Secondary | ICD-10-CM | POA: Diagnosis not present

## 2017-10-01 DIAGNOSIS — E1110 Type 2 diabetes mellitus with ketoacidosis without coma: Secondary | ICD-10-CM | POA: Diagnosis not present

## 2017-10-01 DIAGNOSIS — R69 Illness, unspecified: Secondary | ICD-10-CM | POA: Diagnosis not present

## 2017-10-01 DIAGNOSIS — I517 Cardiomegaly: Secondary | ICD-10-CM | POA: Diagnosis not present

## 2017-10-01 DIAGNOSIS — E119 Type 2 diabetes mellitus without complications: Secondary | ICD-10-CM | POA: Diagnosis not present

## 2017-10-01 DIAGNOSIS — Z87891 Personal history of nicotine dependence: Secondary | ICD-10-CM | POA: Diagnosis not present

## 2017-10-01 DIAGNOSIS — D72829 Elevated white blood cell count, unspecified: Secondary | ICD-10-CM | POA: Diagnosis not present

## 2017-10-01 DIAGNOSIS — E1142 Type 2 diabetes mellitus with diabetic polyneuropathy: Secondary | ICD-10-CM | POA: Diagnosis not present

## 2017-10-01 DIAGNOSIS — R918 Other nonspecific abnormal finding of lung field: Secondary | ICD-10-CM | POA: Diagnosis not present

## 2017-10-01 DIAGNOSIS — E873 Alkalosis: Secondary | ICD-10-CM | POA: Diagnosis not present

## 2017-10-01 DIAGNOSIS — J440 Chronic obstructive pulmonary disease with acute lower respiratory infection: Secondary | ICD-10-CM | POA: Diagnosis not present

## 2017-10-01 DIAGNOSIS — E871 Hypo-osmolality and hyponatremia: Secondary | ICD-10-CM | POA: Diagnosis not present

## 2017-10-01 DIAGNOSIS — J159 Unspecified bacterial pneumonia: Secondary | ICD-10-CM | POA: Diagnosis not present

## 2017-10-01 DIAGNOSIS — Z794 Long term (current) use of insulin: Secondary | ICD-10-CM | POA: Diagnosis not present

## 2017-10-01 DIAGNOSIS — I358 Other nonrheumatic aortic valve disorders: Secondary | ICD-10-CM | POA: Diagnosis not present

## 2017-10-01 DIAGNOSIS — E875 Hyperkalemia: Secondary | ICD-10-CM | POA: Diagnosis not present

## 2017-10-01 DIAGNOSIS — Z7951 Long term (current) use of inhaled steroids: Secondary | ICD-10-CM | POA: Diagnosis not present

## 2017-10-01 DIAGNOSIS — R0600 Dyspnea, unspecified: Secondary | ICD-10-CM | POA: Diagnosis not present

## 2017-10-01 DIAGNOSIS — I10 Essential (primary) hypertension: Secondary | ICD-10-CM | POA: Diagnosis not present

## 2017-10-01 DIAGNOSIS — R0602 Shortness of breath: Secondary | ICD-10-CM | POA: Diagnosis not present

## 2017-10-01 DIAGNOSIS — R05 Cough: Secondary | ICD-10-CM | POA: Diagnosis not present

## 2017-10-01 DIAGNOSIS — J9601 Acute respiratory failure with hypoxia: Secondary | ICD-10-CM | POA: Diagnosis not present

## 2017-10-01 DIAGNOSIS — J111 Influenza due to unidentified influenza virus with other respiratory manifestations: Secondary | ICD-10-CM | POA: Diagnosis not present

## 2017-10-01 DIAGNOSIS — C8590 Non-Hodgkin lymphoma, unspecified, unspecified site: Secondary | ICD-10-CM | POA: Diagnosis not present

## 2017-10-01 DIAGNOSIS — C8317 Mantle cell lymphoma, spleen: Secondary | ICD-10-CM | POA: Diagnosis not present

## 2017-10-01 DIAGNOSIS — J9690 Respiratory failure, unspecified, unspecified whether with hypoxia or hypercapnia: Secondary | ICD-10-CM | POA: Diagnosis not present

## 2017-10-01 DIAGNOSIS — E7800 Pure hypercholesterolemia, unspecified: Secondary | ICD-10-CM | POA: Diagnosis not present

## 2017-10-01 DIAGNOSIS — R531 Weakness: Secondary | ICD-10-CM | POA: Diagnosis not present

## 2017-10-06 ENCOUNTER — Other Ambulatory Visit: Payer: Medicare HMO

## 2017-10-06 ENCOUNTER — Ambulatory Visit: Payer: Medicare HMO | Admitting: Oncology

## 2017-10-11 ENCOUNTER — Other Ambulatory Visit: Payer: Self-pay | Admitting: Oncology

## 2017-10-11 DIAGNOSIS — C8317 Mantle cell lymphoma, spleen: Secondary | ICD-10-CM

## 2017-10-14 MED FILL — IMBRUVICA 560 MG TAB: 560 | 28 days supply | Qty: 28 | Fill #0

## 2017-10-20 ENCOUNTER — Telehealth: Payer: Self-pay

## 2017-10-20 NOTE — Telephone Encounter (Signed)
Oral Chemotherapy Follow- Up  Called patient today to follow up regarding patient's oral chemotherapy medication: Imbruvica (irbrutinib) for the treatment of recurrent mantle cell lymphoma, planned durationuntil disease progression or unacceptable toxicity.  Pt is doing well today.  Pt reports at least 5 missed doses of ibruntinib in the last month due to hospitalization with pneumonia/flu from 10/01/17-10/06/17. He missed his appointment on 10/06/17 due to this hospitalization.  Patient restarted ibrutinib 560mg , one tablet by mouth daily,  upon returning home from hospital.  Since restarting ibrutinib, pt reports stable bruising and several cases of vomiting with no associated nausea. No episodes of vomiting in last week.   Patient was advised to schedule an appointment in the clinic and knows to call with any other questions or concerns.  Thank you, Karmen Stabs, PharmD Candidate 10/20/17

## 2017-10-21 ENCOUNTER — Other Ambulatory Visit: Payer: Self-pay | Admitting: Pharmacist

## 2017-10-21 DIAGNOSIS — Z8709 Personal history of other diseases of the respiratory system: Secondary | ICD-10-CM | POA: Diagnosis not present

## 2017-10-21 DIAGNOSIS — J189 Pneumonia, unspecified organism: Secondary | ICD-10-CM | POA: Diagnosis not present

## 2017-10-21 DIAGNOSIS — Z794 Long term (current) use of insulin: Secondary | ICD-10-CM | POA: Diagnosis not present

## 2017-10-21 DIAGNOSIS — E1110 Type 2 diabetes mellitus with ketoacidosis without coma: Secondary | ICD-10-CM | POA: Diagnosis not present

## 2017-10-21 DIAGNOSIS — R69 Illness, unspecified: Secondary | ICD-10-CM | POA: Diagnosis not present

## 2017-10-21 DIAGNOSIS — E1142 Type 2 diabetes mellitus with diabetic polyneuropathy: Secondary | ICD-10-CM | POA: Diagnosis not present

## 2017-10-21 DIAGNOSIS — R05 Cough: Secondary | ICD-10-CM | POA: Diagnosis not present

## 2017-10-21 DIAGNOSIS — J101 Influenza due to other identified influenza virus with other respiratory manifestations: Secondary | ICD-10-CM | POA: Diagnosis not present

## 2017-10-21 DIAGNOSIS — Z09 Encounter for follow-up examination after completed treatment for conditions other than malignant neoplasm: Secondary | ICD-10-CM | POA: Diagnosis not present

## 2017-11-04 ENCOUNTER — Other Ambulatory Visit: Payer: Self-pay | Admitting: Oncology

## 2017-11-04 DIAGNOSIS — C8317 Mantle cell lymphoma, spleen: Secondary | ICD-10-CM

## 2017-11-09 DIAGNOSIS — Z794 Long term (current) use of insulin: Secondary | ICD-10-CM | POA: Diagnosis not present

## 2017-11-09 DIAGNOSIS — E1142 Type 2 diabetes mellitus with diabetic polyneuropathy: Secondary | ICD-10-CM | POA: Diagnosis not present

## 2017-11-09 DIAGNOSIS — E1165 Type 2 diabetes mellitus with hyperglycemia: Secondary | ICD-10-CM | POA: Diagnosis not present

## 2017-11-09 DIAGNOSIS — E782 Mixed hyperlipidemia: Secondary | ICD-10-CM | POA: Diagnosis not present

## 2017-11-10 MED FILL — IMBRUVICA 560 MG TAB: 560 | 28 days supply | Qty: 28 | Fill #0

## 2017-11-26 ENCOUNTER — Telehealth: Payer: Self-pay | Admitting: Oncology

## 2017-11-26 ENCOUNTER — Inpatient Hospital Stay (HOSPITAL_BASED_OUTPATIENT_CLINIC_OR_DEPARTMENT_OTHER): Payer: Medicare HMO | Admitting: Oncology

## 2017-11-26 ENCOUNTER — Inpatient Hospital Stay: Payer: Medicare HMO | Attending: Oncology

## 2017-11-26 VITALS — BP 150/71 | HR 71 | Temp 98.4°F | Resp 18 | Ht 73.0 in | Wt 225.8 lb

## 2017-11-26 DIAGNOSIS — E875 Hyperkalemia: Secondary | ICD-10-CM | POA: Diagnosis not present

## 2017-11-26 DIAGNOSIS — Z79899 Other long term (current) drug therapy: Secondary | ICD-10-CM | POA: Insufficient documentation

## 2017-11-26 DIAGNOSIS — N289 Disorder of kidney and ureter, unspecified: Secondary | ICD-10-CM

## 2017-11-26 DIAGNOSIS — C8317 Mantle cell lymphoma, spleen: Secondary | ICD-10-CM | POA: Insufficient documentation

## 2017-11-26 DIAGNOSIS — E119 Type 2 diabetes mellitus without complications: Secondary | ICD-10-CM | POA: Insufficient documentation

## 2017-11-26 DIAGNOSIS — Z794 Long term (current) use of insulin: Secondary | ICD-10-CM | POA: Insufficient documentation

## 2017-11-26 LAB — COMPREHENSIVE METABOLIC PANEL
ALBUMIN: 3.7 g/dL (ref 3.5–5.0)
ALK PHOS: 97 U/L (ref 40–150)
ALT: 21 U/L (ref 0–55)
ANION GAP: 9 (ref 3–11)
AST: 21 U/L (ref 5–34)
BILIRUBIN TOTAL: 0.7 mg/dL (ref 0.2–1.2)
BUN: 24 mg/dL (ref 7–26)
CALCIUM: 8.7 mg/dL (ref 8.4–10.4)
CO2: 23 mmol/L (ref 22–29)
Chloride: 107 mmol/L (ref 98–109)
Creatinine, Ser: 1.13 mg/dL (ref 0.70–1.30)
GLUCOSE: 212 mg/dL — AB (ref 70–140)
Potassium: 4.7 mmol/L (ref 3.5–5.1)
Sodium: 139 mmol/L (ref 136–145)
TOTAL PROTEIN: 6.6 g/dL (ref 6.4–8.3)

## 2017-11-26 LAB — CBC WITH DIFFERENTIAL/PLATELET
BASOS ABS: 0 10*3/uL (ref 0.0–0.1)
Basophils Relative: 0 %
Eosinophils Absolute: 0.1 10*3/uL (ref 0.0–0.5)
Eosinophils Relative: 1 %
HEMATOCRIT: 46.3 % (ref 38.4–49.9)
HEMOGLOBIN: 15.3 g/dL (ref 13.0–17.1)
LYMPHS PCT: 36 %
Lymphs Abs: 5.5 10*3/uL — ABNORMAL HIGH (ref 0.9–3.3)
MCH: 29.5 pg (ref 27.2–33.4)
MCHC: 33 g/dL (ref 32.0–36.0)
MCV: 89.4 fL (ref 79.3–98.0)
MONO ABS: 1 10*3/uL — AB (ref 0.1–0.9)
Monocytes Relative: 7 %
NEUTROS ABS: 8.7 10*3/uL — AB (ref 1.5–6.5)
NEUTROS PCT: 56 %
Platelets: 132 10*3/uL — ABNORMAL LOW (ref 140–400)
RBC: 5.18 MIL/uL (ref 4.20–5.82)
RDW: 15.2 % — ABNORMAL HIGH (ref 11.0–14.6)
WBC: 15.4 10*3/uL — AB (ref 4.0–10.3)

## 2017-11-26 NOTE — Progress Notes (Signed)
Hematology and Oncology Follow Up Visit  Micheal Thompson [id number] [birth year] 67 y.o. 11/26/2017 9:04 AM   Principle Diagnosis: 67 year old man with stage IIIA mantle cell lymphoma diagnosed in July of 2014. He presented with lymphocytosis and splenomegaly.  Prior therapy: Chemotherapy with  bendamustine and rituximab started on 02/28/2013. He is S/P 5 cycles completed in 07/2013. He achieved complete response at that time.  He developed relapsed disease in August 2018.  Current therapy: Ibrutinib 560 mg daily started in 03/2017.  Interim History: Micheal Thompson presents today for a follow-up visit.  He reports no major changes in his health or new complications.  He continues to take Imbruvica without any new side effects.  He does report easy bruising and skin bleeding periodically but has been manageable.  He denies any shortness of breath or difficulty breathing.  He denies any hematochezia or melena.  He does not report any constitutional symptoms or abdominal distention.  He denies any palpable or painful adenopathy.  He remains active and attends activities of daily living.  His appetite is excellent and have gained weight.   He does not report any headaches or blurry vision or double vision.  He denies any fevers or chills or sweats.  Does not report any chest pain, palpitation orthopnea.  Does not report any cough, wheezing or hemoptysis.  He does not report any nausea, vomiting or abdominal pain.  Does not report any frequency urgency or hesitancy. Does not report any lymphadenopathy or petechiae.  He does not report any skin rashes or lesions.  He does not report any heat or cold intolerance.  He does not report any anxiety or depression.  His rest of review of systems is negative.  Medications: I have reviewed the patient's current medications.  Current Outpatient Medications  Medication Sig Dispense Refill  . atorvastatin (LIPITOR) 80 MG tablet Take 80 mg by mouth every evening.     . B  Complex-C (B-COMPLEX WITH VITAMIN C) tablet Take 1 tablet by mouth daily.    . ferrous sulfate 325 (65 FE) MG EC tablet Take 325 mg by mouth daily with breakfast.    . IMBRUVICA 560 MG TABS TAKE 1 TABLET BY MOUTH DAILY 28 tablet 0  . insulin regular human CONCENTRATED (HUMULIN R) 500 UNIT/ML SOLN injection Inject 6-16 Units into the skin 3 (three) times daily with meals. 12/14:  Pt with vial & drawing up in a U-100 syringe:  6 units (= 30 units)  in the morning, 16 units (= 80 units) lunch, 10 units (= 50 units) at night. May adjust the 10 unit (50 unit) night dose using sliding scale as needed.    Marland Kitchen JARDIANCE 25 MG TABS tablet Take 25 mg by mouth at bedtime.     Marland Kitchen lisinopril (PRINIVIL,ZESTRIL) 10 MG tablet Take 10 mg by mouth at bedtime.     . Multiple Vitamins-Minerals (CENTRUM PO) Take by mouth daily.    Marland Kitchen oxyCODONE (OXY IR/ROXICODONE) 5 MG immediate release tablet Take 1-2 tablets (5-10 mg total) by mouth every 4 (four) hours as needed for moderate pain. (Patient not taking: Reported on 03/22/2017) 40 tablet 0  . sertraline (ZOLOFT) 100 MG tablet Take 100 mg by mouth daily.    . traZODone (DESYREL) 100 MG tablet Take 100 mg by mouth at bedtime.     No current facility-administered medications for this visit.      Allergies:  Allergies  Allergen Reactions  . No Known Allergies Other (See Comments)  Previously listed PCN Allergy not active.   Per Pt interview, "has taken a number of PCN derivatives" Taken Amoxicillin, Cephalosporins.    Past Medical History, Surgical history, Social history, and Family History were reviewed and updated.    Physical Exam: Blood pressure (!) 150/71, pulse 71, temperature 98.4 F (36.9 C), temperature source Oral, resp. rate 18, height 6\' 1"  (1.854 m), weight 225 lb 12.8 oz (102.4 kg), SpO2 94 %.   ECOG:1 General appearance: Well-appearing gentleman without distress. Head: Atraumatic without abnormalities. Oropharynx: Without any thrush or  ulcers. Eyes: No scleral icterus. Lymph nodes: No lymphadenopathy palpated in the cervical, supraclavicular, and axillary nodes  Heart:regular rate and rhythm, S1, S2 normal, no murmur, click, rub or gallop Lung: Clear to auscultation without any rhonchi, wheezes or dullness to percussion. Abdomin: Soft, nontender without any distention.  Good bowel sounds and no spinal megaly. Musculoskeletal: No joint deformity or effusion. Skin: Erythema noted with healed scratches on his upper extremities.  CBC    Component Value Date/Time   WBC 15.4 (H) 11/26/2017 0850   RBC 5.18 11/26/2017 0850   HGB 15.3 11/26/2017 0850   HGB 16.2 08/13/2017 1236   HCT 46.3 11/26/2017 0850   HCT 50.8 (H) 08/13/2017 1236   PLT 132 (L) 11/26/2017 0850   PLT 160 08/13/2017 1236   MCV 89.4 11/26/2017 0850   MCV 89.0 08/13/2017 1236   MCH 29.5 11/26/2017 0850   MCHC 33.0 11/26/2017 0850   RDW 15.2 (H) 11/26/2017 0850   RDW 15.2 (H) 08/13/2017 1236   LYMPHSABS 5.5 (H) 11/26/2017 0850   LYMPHSABS 35.1 (H) 08/13/2017 1236   MONOABS 1.0 (H) 11/26/2017 0850   MONOABS 1.8 (H) 08/13/2017 1236   EOSABS 0.1 11/26/2017 0850   EOSABS 0.1 08/13/2017 1236   BASOSABS 0.0 11/26/2017 0850   BASOSABS 0.1 08/13/2017 1236      Chemistry      Component Value Date/Time   NA 135 (L) 08/13/2017 1236   K 5.0 08/13/2017 1236   CL 107 07/24/2016 0617   CO2 22 08/13/2017 1236   BUN 28.7 (H) 08/13/2017 1236   CREATININE 1.5 (H) 08/13/2017 1236      Component Value Date/Time   CALCIUM 8.8 08/13/2017 1236   ALKPHOS 109 08/13/2017 1236   AST 21 08/13/2017 1236   ALT 21 08/13/2017 1236   BILITOT 1.44 (H) 08/13/2017 1236     IMPRESSION: 1. Improve splenomegaly, prior splenic volume 3510 cubic cm, current splenic volume 880 cubic cm. 2. Resolution of prior upper abdominal adenopathy. 3. Other imaging findings of potential clinical significance: Aortic Atherosclerosis (ICD10-I70.0). Coronary atherosclerosis.  Lingular bronchiectasis and scarring. Sigmoid diverticulosis. Prostatomegaly. Lower lumbar right-sided impingement. Impression and Plan:  67 year old gentleman with the following issues:  1.  Stage IIIA mantle cell lymphoma presented with massive splenomegaly and lymphadenopathy in July of 2014.   He achieved a complete response that was terrible with bendamustine and rituximab and relapse in 2018.  He is currently on Imbruvica and responding reasonably well.  His laboratory testing showed excellent response with near normalization of his white cell count.  His splenomegaly has resolved and no palpable adenopathy.  CT scan obtained in January 2019 was reviewed and confirmed these findings.  The natural course of this disease as well as treatment options were discussed today.  The risks and benefits of continuing to improve recall long-term was reviewed as well.  Given his excellent response to therapy of elected to keep him on this medication for the  time being until he achieves a complete response and then will consider discontinuation or switching to different therapy.   2. Diabetes mellitus: No recent exacerbation monitored while he is on Imbruvica.  3. Renal insufficiency: Creatinine remained stable at this time and will be repeated periodically.  4. Hyperkalemia: Potassium will be checked periodically and has been within normal range on previous attempts.  5. Cardiac monitoring: No cardiac issues noted while he is on Imbruvica.  He has a history of irregular heartbeat although remains in normal sinus rhythm.  6. Followup: In next 12 weeks to follow her progress.  25  minutes was spent with the patient face-to-face today.  More than 50% of time was dedicated to patient counseling, education and coordination of his multifaceted care.   Zola Button, MD 4/19/20199:04 AM

## 2017-11-26 NOTE — Addendum Note (Signed)
Addended by: Wyatt Portela on: 11/26/2017 09:23 AM   Modules accepted: Level of Service

## 2017-11-26 NOTE — Telephone Encounter (Signed)
Scheduled appt per 4/19 los - Gave patient AVS and calender per los.  

## 2017-12-02 ENCOUNTER — Other Ambulatory Visit: Payer: Self-pay | Admitting: Oncology

## 2017-12-02 DIAGNOSIS — C8317 Mantle cell lymphoma, spleen: Secondary | ICD-10-CM

## 2017-12-03 MED FILL — IMBRUVICA 560 MG TAB: 560 | 28 days supply | Qty: 28 | Fill #0

## 2017-12-10 DIAGNOSIS — Z8709 Personal history of other diseases of the respiratory system: Secondary | ICD-10-CM | POA: Diagnosis not present

## 2017-12-28 ENCOUNTER — Other Ambulatory Visit: Payer: Self-pay | Admitting: Oncology

## 2017-12-28 ENCOUNTER — Other Ambulatory Visit: Payer: Self-pay | Admitting: *Deleted

## 2017-12-28 DIAGNOSIS — C8317 Mantle cell lymphoma, spleen: Secondary | ICD-10-CM

## 2018-01-04 MED FILL — IMBRUVICA 560 MG TAB: 560 | 28 days supply | Qty: 28 | Fill #0

## 2018-01-07 DIAGNOSIS — H938X3 Other specified disorders of ear, bilateral: Secondary | ICD-10-CM | POA: Diagnosis not present

## 2018-01-07 DIAGNOSIS — Z0000 Encounter for general adult medical examination without abnormal findings: Secondary | ICD-10-CM | POA: Diagnosis not present

## 2018-01-07 DIAGNOSIS — Z23 Encounter for immunization: Secondary | ICD-10-CM | POA: Diagnosis not present

## 2018-01-07 DIAGNOSIS — E1165 Type 2 diabetes mellitus with hyperglycemia: Secondary | ICD-10-CM | POA: Diagnosis not present

## 2018-01-07 DIAGNOSIS — E1142 Type 2 diabetes mellitus with diabetic polyneuropathy: Secondary | ICD-10-CM | POA: Diagnosis not present

## 2018-01-07 DIAGNOSIS — Z794 Long term (current) use of insulin: Secondary | ICD-10-CM | POA: Diagnosis not present

## 2018-01-31 ENCOUNTER — Other Ambulatory Visit: Payer: Self-pay | Admitting: Oncology

## 2018-01-31 DIAGNOSIS — C8317 Mantle cell lymphoma, spleen: Secondary | ICD-10-CM

## 2018-02-02 IMAGING — CT CT ABD-PELV W/ CM
2 of 5 series · 16 of 46 positions shown, 18 images · IV contrast (APPLIED)
Comparison: Multiple exams, including 03/19/2017 CT scan

CLINICAL DATA: Mantle cell lymphoma of the spleen. Vomiting for 1
month. Chemotherapy completed in July 2014 with resumption of
oral chemotherapy in March 2017.

EXAM:
CT ABDOMEN AND PELVIS WITH CONTRAST
TECHNIQUE: Multidetector CT imaging of the abdomen and pelvis was performed
using the standard protocol following bolus administration of
intravenous contrast.
CONTRAST:  100mL [id number] IOPAMIDOL ([id number]) INJECTION 61%

[Series 2: axial st · axial · 0.88mm/px · z∈[-513,-43]mm · 13 of 110 slices shown, 15 images]
[im 8/110  soft-tissue]
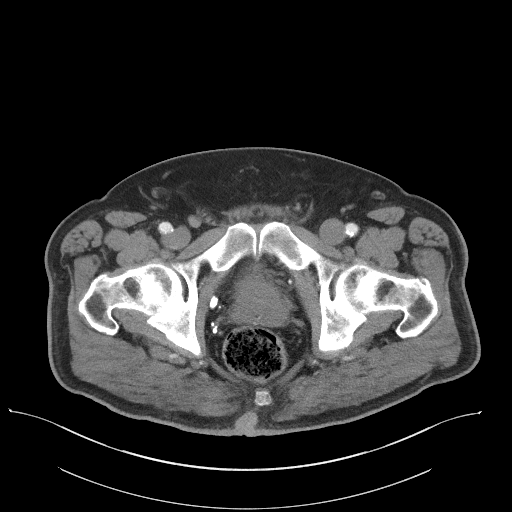
[im 8/110  bone]
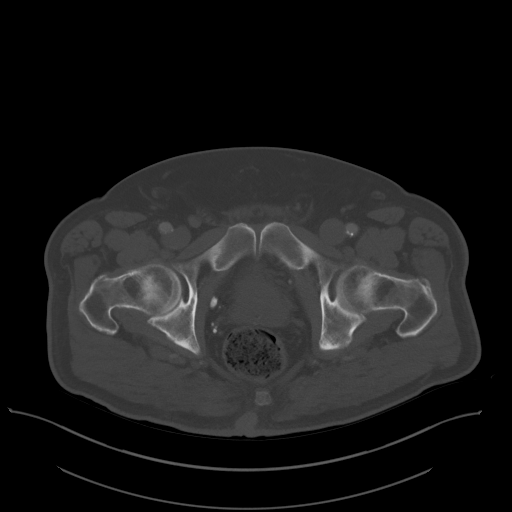
[im 16/110  soft-tissue]
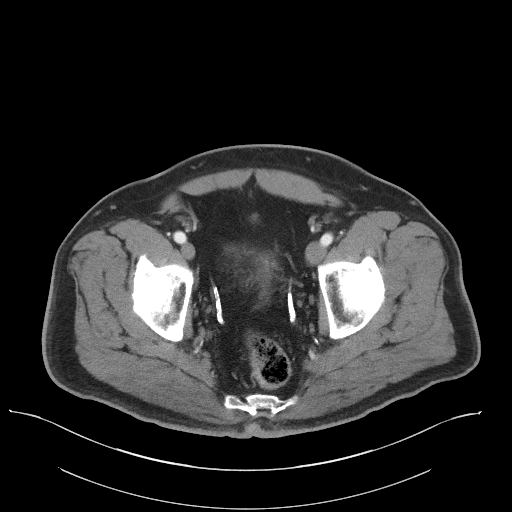
[im 24/110  soft-tissue]
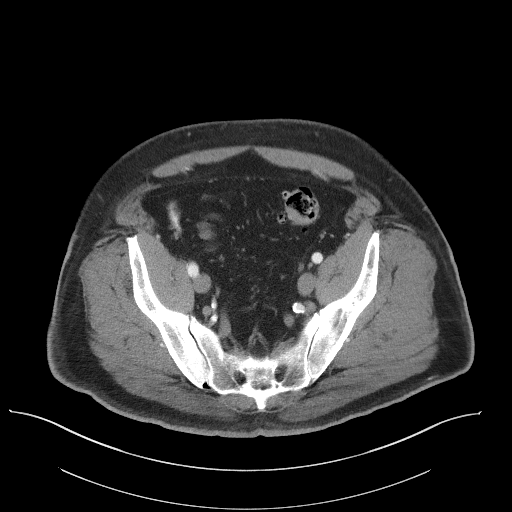
[im 32/110  soft-tissue]
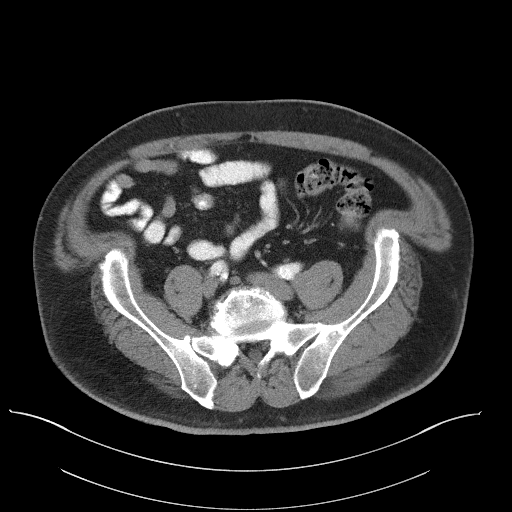
[im 39/110  soft-tissue]
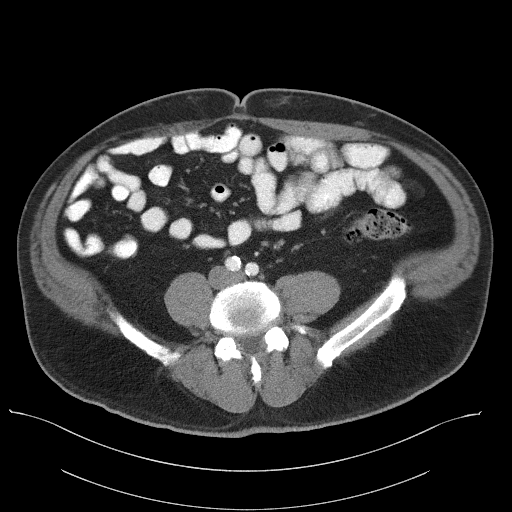
[im 47/110  soft-tissue]
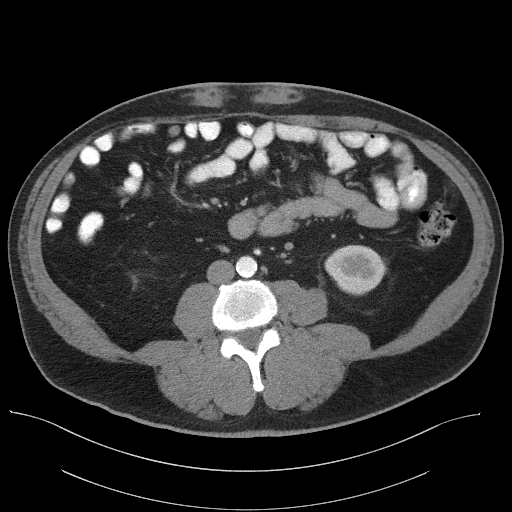
[im 55/110  soft-tissue]
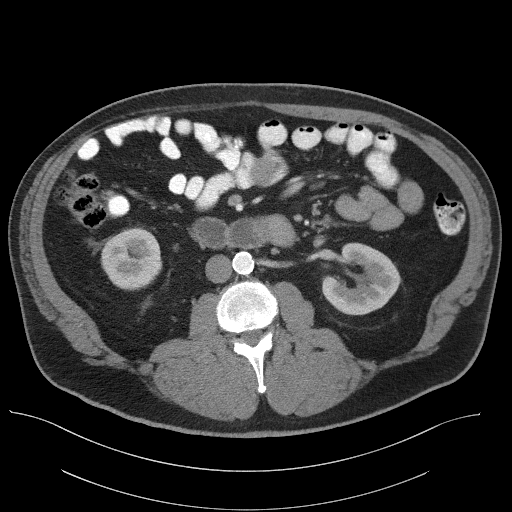
[im 63/110  soft-tissue]
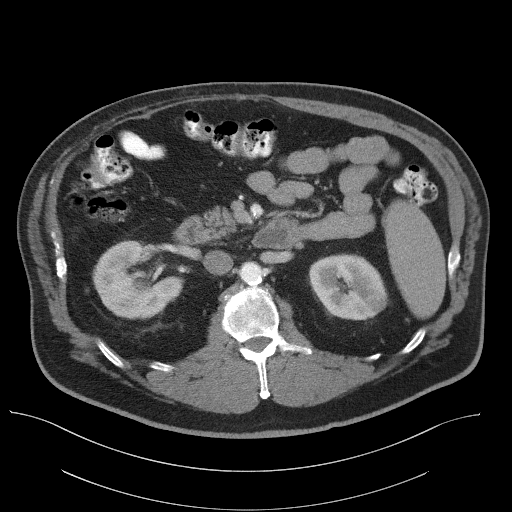
[im 71/110  soft-tissue]
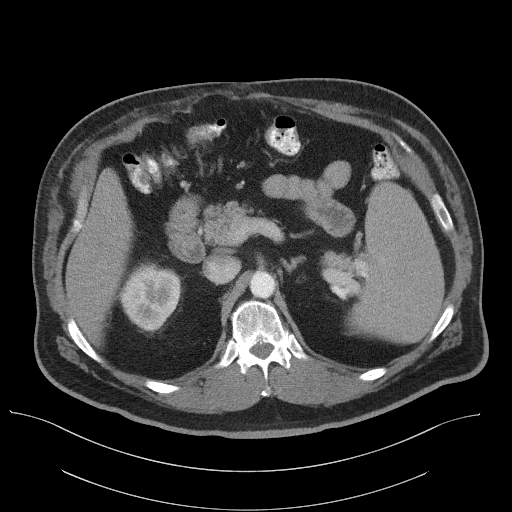
[im 71/110  bone]
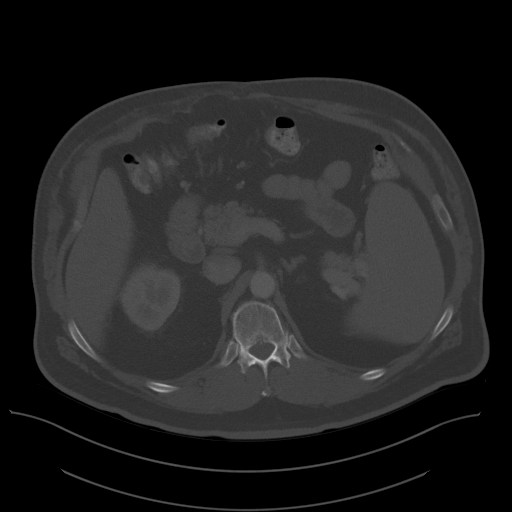
[im 78/110  soft-tissue]
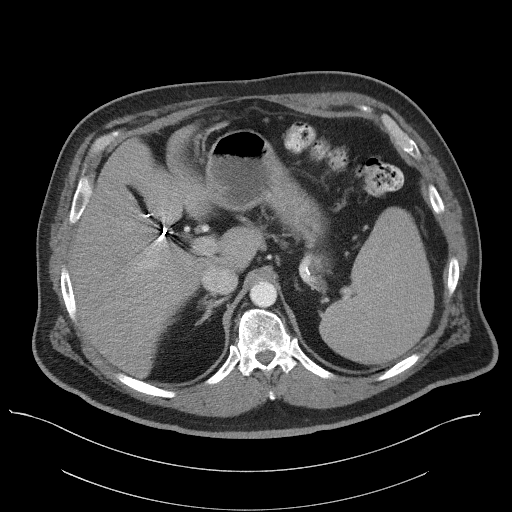
[im 86/110  soft-tissue]
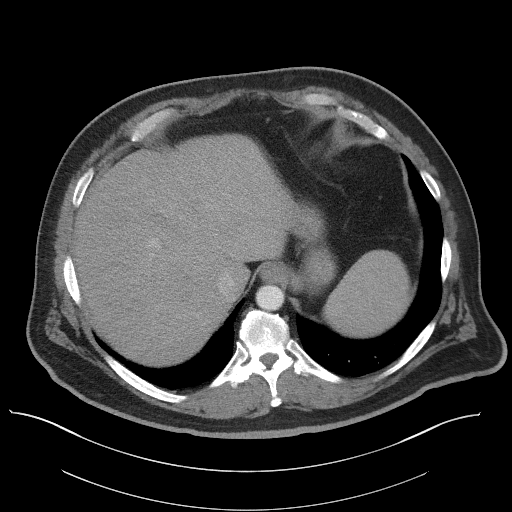
[im 94/110  soft-tissue]
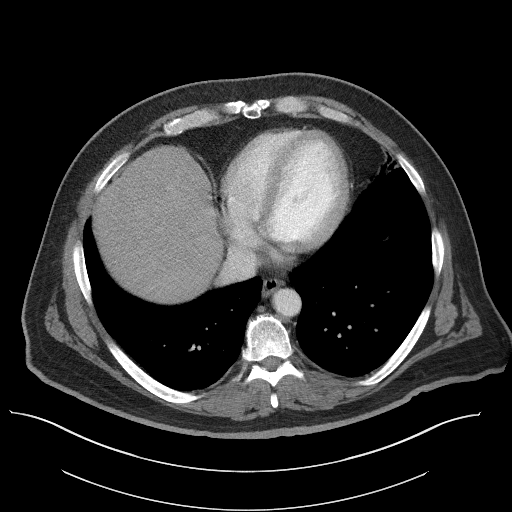
[im 102/110  soft-tissue]
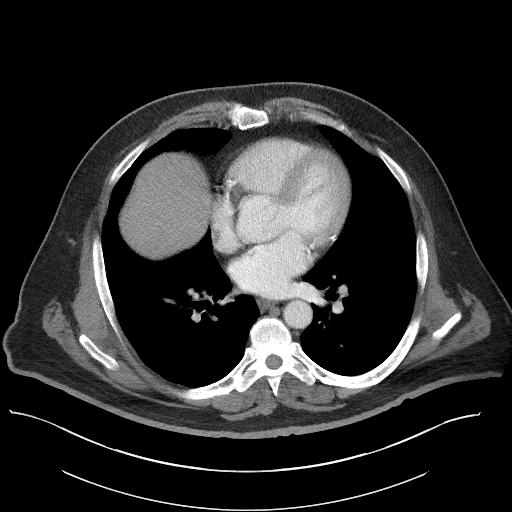

[Series 5: coronal st · coronal · 0.86mm/px · 3 of 107 slices shown]
[im 36/107  soft-tissue]
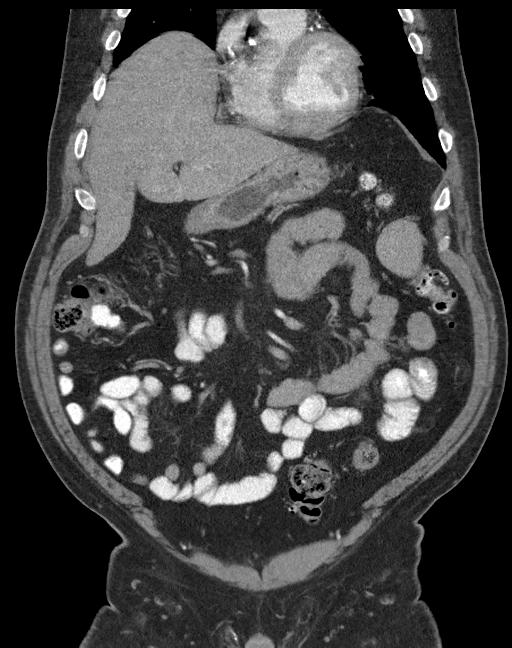
[im 48/107  soft-tissue]
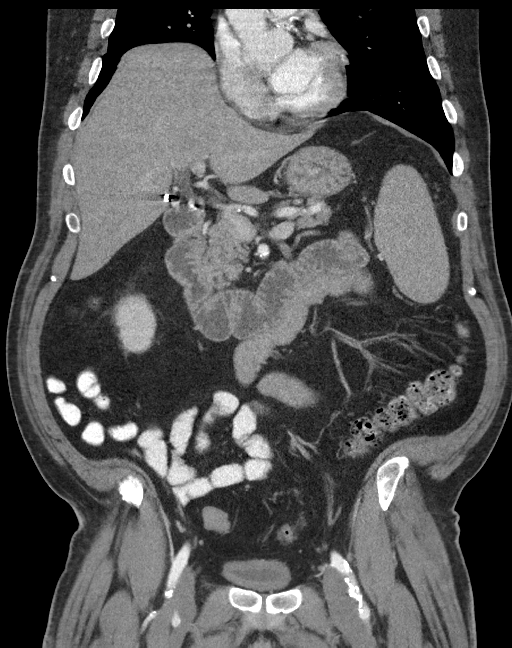
[im 59/107  soft-tissue]
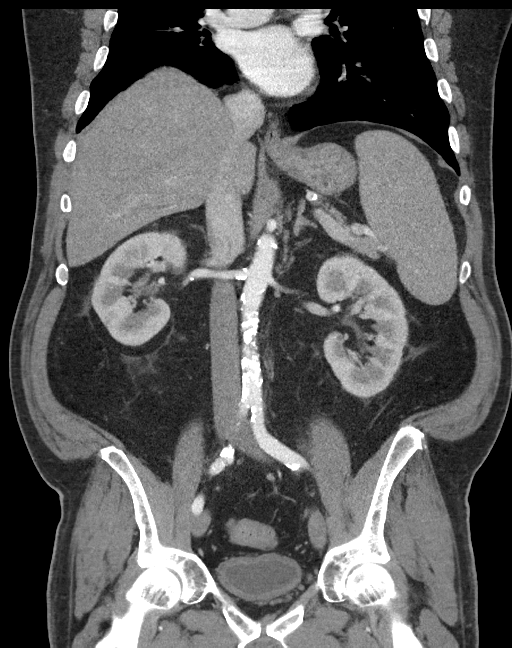

[16 of 46 positions shown; findings below may reference images not displayed]

FINDINGS: Lower chest: Lingular cylindrical bronchiectasis and scarring,
stable. Considerable coronary atherosclerosis.

Hepatobiliary: Cholecystectomy.  Otherwise unremarkable.

Pancreas: Unremarkable

Spleen: The spleen measures 14.7 by 8.0 by 14.3 cm (volume = 880
cm^3). I measure the spleen on the prior exam and measured 20.7 by
13.2 by 24.5 cm (volume = 6268 cm^3). Hence splenic volume,
although still abnormal, is greatly improved. No focal splenic
lesion identified.

Adrenals/Urinary Tract: Adrenal glands unremarkable. Stable right
kidney upper pole cyst. Stable punctate calcification in the left
perirenal space.

Stomach/Bowel: Sigmoid colon diverticulosis. Postoperative findings
along the cecum. Appendectomy.

Vascular/Lymphatic: Aortoiliac atherosclerotic vascular disease.
Prior upper abdominal enlarged lymph nodes have resolved.

Reproductive: Calcified vas deferens.  Mild prostatomegaly.

Other: No supplemental non-categorized findings.

Musculoskeletal: Lower lumbar spondylosis and degenerative disc
disease potentially with mild right foraminal stenosis at L3-4,
L4-5, and L5-S1.
IMPRESSION: 1. Improve splenomegaly, prior splenic volume 6268 cubic cm, current
splenic volume 880 cubic cm.
2. Resolution of prior upper abdominal adenopathy.
3. Other imaging findings of potential clinical significance: Aortic
Atherosclerosis (S561C-[id number].H). Coronary atherosclerosis. Lingular
bronchiectasis and scarring. Sigmoid diverticulosis. Prostatomegaly.
Lower lumbar right-sided impingement.

## 2018-02-07 MED FILL — IMBRUVICA 560 MG TAB: 560 | 28 days supply | Qty: 28 | Fill #0

## 2018-02-08 DIAGNOSIS — E1165 Type 2 diabetes mellitus with hyperglycemia: Secondary | ICD-10-CM | POA: Diagnosis not present

## 2018-02-08 DIAGNOSIS — Z794 Long term (current) use of insulin: Secondary | ICD-10-CM | POA: Diagnosis not present

## 2018-02-08 DIAGNOSIS — E1142 Type 2 diabetes mellitus with diabetic polyneuropathy: Secondary | ICD-10-CM | POA: Diagnosis not present

## 2018-02-08 DIAGNOSIS — E782 Mixed hyperlipidemia: Secondary | ICD-10-CM | POA: Diagnosis not present

## 2018-02-15 DIAGNOSIS — Z7984 Long term (current) use of oral hypoglycemic drugs: Secondary | ICD-10-CM | POA: Diagnosis not present

## 2018-02-15 DIAGNOSIS — H5203 Hypermetropia, bilateral: Secondary | ICD-10-CM | POA: Diagnosis not present

## 2018-02-15 DIAGNOSIS — H2513 Age-related nuclear cataract, bilateral: Secondary | ICD-10-CM | POA: Diagnosis not present

## 2018-02-15 DIAGNOSIS — H524 Presbyopia: Secondary | ICD-10-CM | POA: Diagnosis not present

## 2018-02-15 DIAGNOSIS — E119 Type 2 diabetes mellitus without complications: Secondary | ICD-10-CM | POA: Diagnosis not present

## 2018-02-15 DIAGNOSIS — Z794 Long term (current) use of insulin: Secondary | ICD-10-CM | POA: Diagnosis not present

## 2018-02-25 ENCOUNTER — Inpatient Hospital Stay: Payer: Medicare HMO | Attending: Oncology | Admitting: Oncology

## 2018-02-25 ENCOUNTER — Inpatient Hospital Stay: Payer: Medicare HMO

## 2018-02-25 ENCOUNTER — Telehealth: Payer: Self-pay

## 2018-02-25 VITALS — BP 124/54 | HR 66 | Temp 98.1°F | Resp 17 | Ht 73.0 in | Wt 237.4 lb

## 2018-02-25 DIAGNOSIS — C8317 Mantle cell lymphoma, spleen: Secondary | ICD-10-CM

## 2018-02-25 DIAGNOSIS — N289 Disorder of kidney and ureter, unspecified: Secondary | ICD-10-CM | POA: Insufficient documentation

## 2018-02-25 DIAGNOSIS — Z794 Long term (current) use of insulin: Secondary | ICD-10-CM | POA: Diagnosis not present

## 2018-02-25 DIAGNOSIS — R58 Hemorrhage, not elsewhere classified: Secondary | ICD-10-CM | POA: Diagnosis not present

## 2018-02-25 DIAGNOSIS — E119 Type 2 diabetes mellitus without complications: Secondary | ICD-10-CM | POA: Insufficient documentation

## 2018-02-25 LAB — CBC WITH DIFFERENTIAL (CANCER CENTER ONLY)
BASOS PCT: 0 %
Basophils Absolute: 0 10*3/uL (ref 0.0–0.1)
EOS ABS: 0.2 10*3/uL (ref 0.0–0.5)
Eosinophils Relative: 2 %
HCT: 44.2 % (ref 38.4–49.9)
HEMOGLOBIN: 14.5 g/dL (ref 13.0–17.1)
Lymphocytes Relative: 23 %
Lymphs Abs: 2.6 10*3/uL (ref 0.9–3.3)
MCH: 29.1 pg (ref 27.2–33.4)
MCHC: 32.8 g/dL (ref 32.0–36.0)
MCV: 88.6 fL (ref 79.3–98.0)
MONOS PCT: 9 %
Monocytes Absolute: 1.1 10*3/uL — ABNORMAL HIGH (ref 0.1–0.9)
NEUTROS PCT: 66 %
Neutro Abs: 7.8 10*3/uL — ABNORMAL HIGH (ref 1.5–6.5)
Platelet Count: 123 10*3/uL — ABNORMAL LOW (ref 140–400)
RBC: 4.99 MIL/uL (ref 4.20–5.82)
RDW: 14.9 % — AB (ref 11.0–14.6)
WBC Count: 11.6 10*3/uL — ABNORMAL HIGH (ref 4.0–10.3)

## 2018-02-25 LAB — CMP (CANCER CENTER ONLY)
ALBUMIN: 3.7 g/dL (ref 3.5–5.0)
ALT: 24 U/L (ref 0–44)
AST: 23 U/L (ref 15–41)
Alkaline Phosphatase: 99 U/L (ref 38–126)
Anion gap: 8 (ref 5–15)
BUN: 24 mg/dL — ABNORMAL HIGH (ref 8–23)
CALCIUM: 8.7 mg/dL — AB (ref 8.9–10.3)
CHLORIDE: 104 mmol/L (ref 98–111)
CO2: 25 mmol/L (ref 22–32)
CREATININE: 1.24 mg/dL (ref 0.61–1.24)
GFR, Est AFR Am: >60 mL/min (ref >60)
GFR, Estimated: 59 mL/min — ABNORMAL LOW (ref >60)
Glucose, Bld: 252 mg/dL — ABNORMAL HIGH (ref 70–99)
POTASSIUM: 4.8 mmol/L (ref 3.5–5.1)
Sodium: 137 mmol/L (ref 135–145)
Total Bilirubin: 0.9 mg/dL (ref 0.3–1.2)
Total Protein: 6.4 g/dL — ABNORMAL LOW (ref 6.5–8.1)

## 2018-02-25 NOTE — Progress Notes (Signed)
Hematology and Oncology Follow Up Visit  Micheal Thompson [id number] February 01, 1951 67 y.o. 02/25/2018 10:34 AM   Principle Diagnosis: 67 year old man with mantle cell lymphoma presented with lymphocytosis and splenomegaly.  He presented with stage IIIa diagnosed in July of 2014.   Prior therapy: Chemotherapy with  bendamustine and rituximab started on 02/28/2013. He is S/P 5 cycles completed in 07/2013. He achieved complete response at that time.  He developed relapsed disease in August 2018.  Current therapy: Ibrutinib 560 mg daily started in 03/2017.  Interim History: Micheal Thompson is here for a follow-up visit.  Since her last visit, he reports no major changes in his health.  He reports he has been eating well and actively trying to lose weight by eating healthy.  He continues to tolerate ibrutinib without any complications.  He continues to have ecchymosis and occasional bleeding on his arms and skin which is manageable.  He denies any palpitation or dyspnea on exertion.  His performance status and activity level remain excellent.  He has no difficulties obtaining and taking this medication at this time.  He denies any palpable adenopathy or other constitutional symptoms.   He does not report any headaches or blurry vision or double vision.  He denies any alteration mental status or confusion.  He denies any fevers or chills or sweats.  Appetite and weight is unchanged.  Does not report any chest pain, palpitation orthopnea.  Does not report any cough, wheezing or hemoptysis.  He does not report any nausea, vomiting or abdominal pain.  He denies any change in his bowel habits.  Does not report any frequency urgency or hesitancy. Does not report any lymphadenopathy or petechiae.  He does not report any skin rashes or lesions.  He does not report any heat or cold intolerance.  He does not report any changes in his mood.  His rest of review of systems is negative.  Medications: I have reviewed the patient's  current medications.  Current Outpatient Medications  Medication Sig Dispense Refill  . atorvastatin (LIPITOR) 80 MG tablet Take 80 mg by mouth every evening.     . B Complex-C (B-COMPLEX WITH VITAMIN C) tablet Take 1 tablet by mouth daily.    . ferrous sulfate 325 (65 FE) MG EC tablet Take 325 mg by mouth daily with breakfast.    . IMBRUVICA 560 MG TABS TAKE 1 TABLET BY MOUTH DAILY 28 tablet 0  . insulin regular human CONCENTRATED (HUMULIN R) 500 UNIT/ML SOLN injection Inject 6-16 Units into the skin 3 (three) times daily with meals. 12/14:  Pt with vial & drawing up in a U-100 syringe:  6 units (= 30 units)  in the morning, 16 units (= 80 units) lunch, 10 units (= 50 units) at night. May adjust the 10 unit (50 unit) night dose using sliding scale as needed.    Marland Kitchen JARDIANCE 25 MG TABS tablet Take 25 mg by mouth at bedtime.     Marland Kitchen lisinopril (PRINIVIL,ZESTRIL) 10 MG tablet Take 10 mg by mouth at bedtime.     . Multiple Vitamins-Minerals (CENTRUM PO) Take by mouth daily.    Marland Kitchen oxyCODONE (OXY IR/ROXICODONE) 5 MG immediate release tablet Take 1-2 tablets (5-10 mg total) by mouth every 4 (four) hours as needed for moderate pain. (Patient not taking: Reported on 03/22/2017) 40 tablet 0  . sertraline (ZOLOFT) 100 MG tablet Take 100 mg by mouth daily.    . traZODone (DESYREL) 100 MG tablet Take 100 mg by mouth  at bedtime.     No current facility-administered medications for this visit.      Allergies:  Allergies  Allergen Reactions  . No Known Allergies Other (See Comments)    Previously listed PCN Allergy not active.   Per Pt interview, "has taken a number of PCN derivatives" Taken Amoxicillin, Cephalosporins.    Past Medical History, Surgical history, Social history, and Family History were reviewed and updated.    Physical Exam: Blood pressure (!) 124/54, pulse 66, temperature 98.1 F (36.7 C), temperature source Oral, resp. rate 17, height 6\' 1"  (1.854 m), weight 237 lb 6.4 oz (107.7 kg),  SpO2 98 %.    ECOG:1 General appearance: Alert, awake gentleman without distress Head: Normocephalic without normalities. Oropharynx: No oral thrush or ulcers. Eyes: Pupils are equal and round reactive to light. Lymph nodes: No cervical, supraclavicular, inguinal or axillary lymphadenopathy. Heart:regular rate and rhythm, without any murmurs or gallops. Lung: Clear without any rhonchi, wheezes or dullness to percussion. Abdomin: Soft, without any rebound or guarding.  No shifting dullness or ascites. Musculoskeletal: No joint deformity or effusion. Skin: Ecchymosis noted upper arms.  CBC    Component Value Date/Time   WBC 15.4 (H) 11/26/2017 0850   RBC 5.18 11/26/2017 0850   HGB 15.3 11/26/2017 0850   HGB 16.2 08/13/2017 1236   HCT 46.3 11/26/2017 0850   HCT 50.8 (H) 08/13/2017 1236   PLT 132 (L) 11/26/2017 0850   PLT 160 08/13/2017 1236   MCV 89.4 11/26/2017 0850   MCV 89.0 08/13/2017 1236   MCH 29.5 11/26/2017 0850   MCHC 33.0 11/26/2017 0850   RDW 15.2 (H) 11/26/2017 0850   RDW 15.2 (H) 08/13/2017 1236   LYMPHSABS 5.5 (H) 11/26/2017 0850   LYMPHSABS 35.1 (H) 08/13/2017 1236   MONOABS 1.0 (H) 11/26/2017 0850   MONOABS 1.8 (H) 08/13/2017 1236   EOSABS 0.1 11/26/2017 0850   EOSABS 0.1 08/13/2017 1236   BASOSABS 0.0 11/26/2017 0850   BASOSABS 0.1 08/13/2017 1236      Chemistry      Component Value Date/Time   NA 139 11/26/2017 0850   NA 135 (L) 08/13/2017 1236   K 4.7 11/26/2017 0850   K 5.0 08/13/2017 1236   CL 107 11/26/2017 0850   CO2 23 11/26/2017 0850   CO2 22 08/13/2017 1236   BUN 24 11/26/2017 0850   BUN 28.7 (H) 08/13/2017 1236   CREATININE 1.13 11/26/2017 0850   CREATININE 1.5 (H) 08/13/2017 1236      Component Value Date/Time   CALCIUM 8.7 11/26/2017 0850   CALCIUM 8.8 08/13/2017 1236   ALKPHOS 97 11/26/2017 0850   ALKPHOS 109 08/13/2017 1236   AST 21 11/26/2017 0850   AST 21 08/13/2017 1236   ALT 21 11/26/2017 0850   ALT 21 08/13/2017 1236    BILITOT 0.7 11/26/2017 0850   BILITOT 1.44 (H) 08/13/2017 369       67 year old man with:  1.  Mantle cell lymphoma diagnosed in July 2014.  He presented with stage IIIA and massive splenomegaly and lymphadenopathy.   He is currently on ibrutinib since August 2019 without any major complications.  His CBC was reviewed today and showed near complete response with normalization of his white cell count at this time.  Risks and benefits of continuing this therapy was reviewed today and potential long-term complications was discussed.  Given his excellent response to therapy he is agreeable to continue for the time being we will continue to monitor him periodically.  Repeat imaging studies will be done in the next 3 to 4 months.  S imaging studies done in January 2019 showed a near complete response to therapy.  2. Diabetes mellitus: No exacerbations noted due to this medication.  3. Renal insufficiency: Creatinine has normalized without any recent complications.  4. Hyperkalemia: This has resolved with normal potassium in April 2019.  5. Cardiac monitoring: No issues reported since last visit.  6. Followup: In 3 months.  15  minutes was spent with the patient face-to-face today.  More than 50% of time was dedicated to patient counseling, education and discussing the natural course of this disease and alternative treatment options.     Zola Button, MD 7/19/201910:34 AM

## 2018-02-25 NOTE — Telephone Encounter (Signed)
Printed avs and calender of upcoming appointment. Per 7/19 los. Patient requested date  10/22@ 10am

## 2018-03-01 ENCOUNTER — Other Ambulatory Visit: Payer: Self-pay | Admitting: Oncology

## 2018-03-01 DIAGNOSIS — C8317 Mantle cell lymphoma, spleen: Secondary | ICD-10-CM

## 2018-03-03 MED FILL — IMBRUVICA 560 MG TAB: 560 | 28 days supply | Qty: 28 | Fill #0

## 2018-03-28 ENCOUNTER — Other Ambulatory Visit: Payer: Self-pay | Admitting: Oncology

## 2018-03-28 DIAGNOSIS — C8317 Mantle cell lymphoma, spleen: Secondary | ICD-10-CM

## 2018-04-04 MED FILL — IMBRUVICA 560 MG TAB: 560 | 28 days supply | Qty: 28 | Fill #0

## 2018-04-28 ENCOUNTER — Other Ambulatory Visit: Payer: Self-pay | Admitting: Oncology

## 2018-04-28 DIAGNOSIS — C8317 Mantle cell lymphoma, spleen: Secondary | ICD-10-CM

## 2018-05-03 MED FILL — IMBRUVICA 560 MG TAB: 560 | 28 days supply | Qty: 28 | Fill #0

## 2018-05-11 DIAGNOSIS — E1165 Type 2 diabetes mellitus with hyperglycemia: Secondary | ICD-10-CM | POA: Diagnosis not present

## 2018-05-11 DIAGNOSIS — E782 Mixed hyperlipidemia: Secondary | ICD-10-CM | POA: Diagnosis not present

## 2018-05-11 DIAGNOSIS — E1142 Type 2 diabetes mellitus with diabetic polyneuropathy: Secondary | ICD-10-CM | POA: Diagnosis not present

## 2018-05-11 DIAGNOSIS — Z794 Long term (current) use of insulin: Secondary | ICD-10-CM | POA: Diagnosis not present

## 2018-05-25 ENCOUNTER — Other Ambulatory Visit: Payer: Self-pay | Admitting: Oncology

## 2018-05-25 DIAGNOSIS — C8317 Mantle cell lymphoma, spleen: Secondary | ICD-10-CM

## 2018-05-31 ENCOUNTER — Telehealth: Payer: Self-pay | Admitting: Oncology

## 2018-05-31 ENCOUNTER — Inpatient Hospital Stay: Payer: Medicare HMO | Attending: Oncology

## 2018-05-31 ENCOUNTER — Inpatient Hospital Stay (HOSPITAL_BASED_OUTPATIENT_CLINIC_OR_DEPARTMENT_OTHER): Payer: Medicare HMO | Admitting: Oncology

## 2018-05-31 VITALS — BP 130/74 | HR 60 | Temp 97.8°F | Resp 17 | Wt 245.4 lb

## 2018-05-31 DIAGNOSIS — Z79899 Other long term (current) drug therapy: Secondary | ICD-10-CM | POA: Insufficient documentation

## 2018-05-31 DIAGNOSIS — Z794 Long term (current) use of insulin: Secondary | ICD-10-CM | POA: Diagnosis not present

## 2018-05-31 DIAGNOSIS — N289 Disorder of kidney and ureter, unspecified: Secondary | ICD-10-CM

## 2018-05-31 DIAGNOSIS — C8317 Mantle cell lymphoma, spleen: Secondary | ICD-10-CM | POA: Diagnosis not present

## 2018-05-31 DIAGNOSIS — E875 Hyperkalemia: Secondary | ICD-10-CM | POA: Diagnosis not present

## 2018-05-31 DIAGNOSIS — E119 Type 2 diabetes mellitus without complications: Secondary | ICD-10-CM | POA: Insufficient documentation

## 2018-05-31 LAB — CBC WITH DIFFERENTIAL (CANCER CENTER ONLY)
BASOS PCT: 0 %
Basophils Absolute: 0 10*3/uL (ref 0.0–0.1)
EOS ABS: 0 10*3/uL (ref 0.0–0.5)
Eosinophils Relative: 0 %
HCT: 49.1 % (ref 39.0–52.0)
Hemoglobin: 16 g/dL (ref 13.0–17.0)
Lymphocytes Relative: 39 %
Lymphs Abs: 6.1 10*3/uL — ABNORMAL HIGH (ref 0.7–4.0)
MCH: 29.5 pg (ref 26.0–34.0)
MCHC: 32.6 g/dL (ref 30.0–36.0)
MCV: 90.6 fL (ref 80.0–100.0)
MONO ABS: 0.2 10*3/uL (ref 0.1–1.0)
MONOS PCT: 1 %
Metamyelocytes Relative: 1 %
NEUTROS PCT: 59 %
Neutro Abs: 9.4 10*3/uL (ref 1.7–17.7)
PLATELETS: 151 10*3/uL (ref 150–400)
RBC: 5.42 MIL/uL (ref 4.22–5.81)
RDW: 14.2 % (ref 11.5–15.5)
WBC: 15.7 10*3/uL — AB (ref 4.0–10.5)
nRBC: 0 % (ref 0.0–0.2)

## 2018-05-31 LAB — CMP (CANCER CENTER ONLY)
ALBUMIN: 3.7 g/dL (ref 3.5–5.0)
ALK PHOS: 117 U/L (ref 38–126)
ALT: 28 U/L (ref 0–44)
ANION GAP: 11 (ref 5–15)
AST: 30 U/L (ref 15–41)
BUN: 15 mg/dL (ref 8–23)
CALCIUM: 8.8 mg/dL — AB (ref 8.9–10.3)
CO2: 24 mmol/L (ref 22–32)
Chloride: 104 mmol/L (ref 98–111)
Creatinine: 1.08 mg/dL (ref 0.61–1.24)
GFR, Est AFR Am: >60 mL/min (ref >60)
GLUCOSE: 180 mg/dL — AB (ref 70–99)
POTASSIUM: 4.6 mmol/L (ref 3.5–5.1)
Sodium: 139 mmol/L (ref 135–145)
Total Bilirubin: 0.6 mg/dL (ref 0.3–1.2)
Total Protein: 6.7 g/dL (ref 6.5–8.1)

## 2018-05-31 NOTE — Telephone Encounter (Signed)
Appts scheduled contrast w/ instructions provided avs/calendar printed per 10/22 los

## 2018-05-31 NOTE — Progress Notes (Signed)
Hematology and Oncology Follow Up Visit  Micheal Thompson [id number] [birth year] 67 y.o. 05/31/2018 10:43 AM   Principle Diagnosis: 67 year old man with mantle cell lymphoma diagnosed in July 2014.  He was found to have lymphocytosis and splenomegaly and stage IIIA disease.  Prior therapy: Chemotherapy with  bendamustine and rituximab started on 02/28/2013. He is S/P 5 cycles completed in 07/2013. He achieved complete response at that time.  He developed relapsed disease in August 2018.  Current therapy: Ibrutinib 560 mg daily started in 03/2017.  Interim History: Micheal Thompson returns today for repeat evaluation.  Since the last visit, he continues to tolerate ibrutinib without any complications.  He does report some dermatological changes including occasional bleeding and ulcers that resolved spontaneously.  He denies any excessive fatigue, tiredness or palpitation.  He denies any thrombosis or bleeding episodes.  He denies any lymphadenopathy or excessive tiredness.  His performance status and activity level is unchanged.   He does not report any headaches or blurry vision or double vision.  He denies any dizziness or syncope.  He denies any fevers or chills or sweats.  Does not report any chest pain, palpitation orthopnea.  Does not report any cough, wheezing or hemoptysis.  He does not report any nausea, vomiting or abdominal pain.  He denies any constipation or diarrhea.  Does not report any frequency urgency or hesitancy. Does not report any leading or clotting tendency.  He does not report any heat or cold intolerance.  He does not report any anxiety or depression.  His rest of review of systems is negative.  Medications: I have reviewed the patient's current medications.  Current Outpatient Medications  Medication Sig Dispense Refill  . atorvastatin (LIPITOR) 80 MG tablet Take 80 mg by mouth every evening.     . B Complex-C (B-COMPLEX WITH VITAMIN C) tablet Take 1 tablet by mouth daily.    .  ferrous sulfate 325 (65 FE) MG EC tablet Take 325 mg by mouth daily with breakfast.    . IMBRUVICA 560 MG TABS TAKE 1 TABLET BY MOUTH DAILY 28 tablet 0  . insulin regular human CONCENTRATED (HUMULIN R) 500 UNIT/ML SOLN injection Inject 6-16 Units into the skin 3 (three) times daily with meals. 12/14:  Pt with vial & drawing up in a U-100 syringe:  6 units (= 30 units)  in the morning, 16 units (= 80 units) lunch, 10 units (= 50 units) at night. May adjust the 10 unit (50 unit) night dose using sliding scale as needed.    Marland Kitchen JARDIANCE 25 MG TABS tablet Take 25 mg by mouth at bedtime.     Marland Kitchen lisinopril (PRINIVIL,ZESTRIL) 10 MG tablet Take 10 mg by mouth at bedtime.     . Multiple Vitamins-Minerals (CENTRUM PO) Take by mouth daily.    Marland Kitchen oxyCODONE (OXY IR/ROXICODONE) 5 MG immediate release tablet Take 1-2 tablets (5-10 mg total) by mouth every 4 (four) hours as needed for moderate pain. (Patient not taking: Reported on 03/22/2017) 40 tablet 0  . sertraline (ZOLOFT) 100 MG tablet Take 100 mg by mouth daily.    . traZODone (DESYREL) 100 MG tablet Take 100 mg by mouth at bedtime.     No current facility-administered medications for this visit.      Allergies:  Allergies  Allergen Reactions  . No Known Allergies Other (See Comments)    Previously listed PCN Allergy not active.   Per Pt interview, "has taken a number of PCN derivatives" Taken Amoxicillin, Cephalosporins.  Past Medical History, Surgical history, Social history, and Family History were reviewed and updated.    Physical Exam: Blood pressure 130/74, pulse 60, temperature 97.8 F (36.6 C), temperature source Oral, resp. rate 17, weight 245 lb 6 oz (111.3 kg), SpO2 97 %.    ECOG:1   General appearance: Comfortable appearing without any discomfort Head: Normocephalic without any trauma Oropharynx: Mucous membranes are moist and pink without any thrush or ulcers. Eyes: Pupils are equal and round reactive to light. Lymph nodes:  No cervical, supraclavicular, inguinal or axillary lymphadenopathy.   Heart:regular rate and rhythm.  S1 and S2 without leg edema. Lung: Clear without any rhonchi or wheezes.  No dullness to percussion. Abdomin: Soft, nontender, nondistended with good bowel sounds.  No hepatosplenomegaly. Musculoskeletal: No joint deformity or effusion.  Full range of motion noted. Neurological: No deficits noted on motor, sensory and deep tendon reflex exam. Skin: No petechial rash or dryness.  Appeared moist.    CBC    Component Value Date/Time   WBC 15.7 (H) 05/31/2018 1022   WBC 15.4 (H) 11/26/2017 0850   RBC 5.42 05/31/2018 1022   HGB 16.0 05/31/2018 1022   HGB 16.2 08/13/2017 1236   HCT 49.1 05/31/2018 1022   HCT 50.8 (H) 08/13/2017 1236   PLT 151 05/31/2018 1022   PLT 160 08/13/2017 1236   MCV 90.6 05/31/2018 1022   MCV 89.0 08/13/2017 1236   MCH 29.5 05/31/2018 1022   MCHC 32.6 05/31/2018 1022   RDW 14.2 05/31/2018 1022   RDW 15.2 (H) 08/13/2017 1236   LYMPHSABS PENDING 05/31/2018 1022   LYMPHSABS 35.1 (H) 08/13/2017 1236   MONOABS PENDING 05/31/2018 1022   MONOABS 1.8 (H) 08/13/2017 1236   EOSABS PENDING 05/31/2018 1022   EOSABS 0.1 08/13/2017 1236   BASOSABS PENDING 05/31/2018 1022   BASOSABS 0.1 08/13/2017 1236      Chemistry      Component Value Date/Time   NA 137 02/25/2018 1017   NA 135 (L) 08/13/2017 1236   K 4.8 02/25/2018 1017   K 5.0 08/13/2017 1236   CL 104 02/25/2018 1017   CO2 25 02/25/2018 1017   CO2 22 08/13/2017 1236   BUN 24 (H) 02/25/2018 1017   BUN 28.7 (H) 08/13/2017 1236   CREATININE 1.24 02/25/2018 1017   CREATININE 1.5 (H) 08/13/2017 1236      Component Value Date/Time   CALCIUM 8.7 (L) 02/25/2018 1017   CALCIUM 8.8 08/13/2017 1236   ALKPHOS 99 02/25/2018 1017   ALKPHOS 109 08/13/2017 1236   AST 23 02/25/2018 1017   AST 21 08/13/2017 1236   ALT 24 02/25/2018 1017   ALT 21 08/13/2017 1236   BILITOT 0.9 02/25/2018 1017   BILITOT 1.44 (H)  08/13/2017 2789       67 year old man with:  1.  Stage IIIa Mantle cell lymphoma diagnosed in July 2014.  Marland Kitchen   He remains on ibrutinib with excellent response to therapy and near resolution of his lymphadenopathy and lymphocytosis.  Risks and benefits of continuing this medication long-term was reviewed.  Complications including cardiovascular dermatological toxicities were discussed.  At this time, he is agreeable to continue and will repeat staging work-up in 3 months and if he achieves complete response at this time, will consider drug discontinuation.  2. Diabetes mellitus: Blood sugar under reasonable control without any recent exacerbations.  3. Renal insufficiency: Kidney function is back to normal at this time.  4. Hyperkalemia: His potassium will continue to be checked periodically  no issues at this time.  5. Cardiac monitoring: No cardiovascular complications noted at this time.  He denies any palpitations in the interim.  6. Followup: In January 2020  15  minutes was spent with the patient face-to-face today.  More than 50% of time was dedicated to reviewing his disease status, treatment options and coordinating plan of care.   Zola Button, MD 10/22/201910:43 AM

## 2018-06-01 MED FILL — IMBRUVICA 560 MG TAB: 560 | 28 days supply | Qty: 28 | Fill #0

## 2018-06-02 DIAGNOSIS — R69 Illness, unspecified: Secondary | ICD-10-CM | POA: Diagnosis not present

## 2018-06-02 DIAGNOSIS — C8310 Mantle cell lymphoma, unspecified site: Secondary | ICD-10-CM | POA: Diagnosis not present

## 2018-06-02 DIAGNOSIS — Z23 Encounter for immunization: Secondary | ICD-10-CM | POA: Diagnosis not present

## 2018-06-23 ENCOUNTER — Other Ambulatory Visit: Payer: Self-pay | Admitting: Oncology

## 2018-06-23 DIAGNOSIS — C8317 Mantle cell lymphoma, spleen: Secondary | ICD-10-CM

## 2018-07-01 MED FILL — IMBRUVICA 560 MG TAB: 560 | 28 days supply | Qty: 28 | Fill #0

## 2018-07-25 ENCOUNTER — Other Ambulatory Visit: Payer: Self-pay | Admitting: Oncology

## 2018-07-25 DIAGNOSIS — C8317 Mantle cell lymphoma, spleen: Secondary | ICD-10-CM

## 2018-08-05 ENCOUNTER — Inpatient Hospital Stay: Payer: Medicare HMO | Attending: Oncology

## 2018-08-05 ENCOUNTER — Ambulatory Visit (HOSPITAL_COMMUNITY)
Admission: RE | Admit: 2018-08-05 | Discharge: 2018-08-05 | Disposition: A | Discharge status: Home / Self Care | Payer: Medicare HMO | Source: Ambulatory Visit | Attending: Oncology | Admitting: Oncology

## 2018-08-05 ENCOUNTER — Other Ambulatory Visit: Payer: Medicare HMO

## 2018-08-05 DIAGNOSIS — C8317 Mantle cell lymphoma, spleen: Secondary | ICD-10-CM | POA: Diagnosis not present

## 2018-08-05 DIAGNOSIS — E119 Type 2 diabetes mellitus without complications: Secondary | ICD-10-CM | POA: Insufficient documentation

## 2018-08-05 DIAGNOSIS — N289 Disorder of kidney and ureter, unspecified: Secondary | ICD-10-CM | POA: Diagnosis not present

## 2018-08-05 DIAGNOSIS — R161 Splenomegaly, not elsewhere classified: Secondary | ICD-10-CM | POA: Diagnosis not present

## 2018-08-05 DIAGNOSIS — Z794 Long term (current) use of insulin: Secondary | ICD-10-CM | POA: Diagnosis not present

## 2018-08-05 LAB — CMP (CANCER CENTER ONLY)
ALT: 28 U/L (ref 0–44)
ANION GAP: 6 (ref 5–15)
AST: 26 U/L (ref 15–41)
Albumin: 3.8 g/dL (ref 3.5–5.0)
Alkaline Phosphatase: 101 U/L (ref 38–126)
BUN: 19 mg/dL (ref 8–23)
CO2: 29 mmol/L (ref 22–32)
Calcium: 8.7 mg/dL — ABNORMAL LOW (ref 8.9–10.3)
Chloride: 104 mmol/L (ref 98–111)
Creatinine: 1.21 mg/dL (ref 0.61–1.24)
GFR, Est AFR Am: >60 mL/min (ref >60)
GFR, Estimated: >60 mL/min (ref >60)
Glucose, Bld: 164 mg/dL — ABNORMAL HIGH (ref 70–99)
Potassium: 4.9 mmol/L (ref 3.5–5.1)
Sodium: 139 mmol/L (ref 135–145)
Total Bilirubin: 0.8 mg/dL (ref 0.3–1.2)
Total Protein: 6.8 g/dL (ref 6.5–8.1)

## 2018-08-05 LAB — CBC WITH DIFFERENTIAL (CANCER CENTER ONLY)
Abs Immature Granulocytes: 0.09 10*3/uL — ABNORMAL HIGH (ref 0.00–0.07)
Basophils Absolute: 0.1 10*3/uL (ref 0.0–0.1)
Basophils Relative: 1 %
Eosinophils Absolute: 0.1 10*3/uL (ref 0.0–0.5)
Eosinophils Relative: 1 %
HCT: 49.8 % (ref 39.0–52.0)
HEMOGLOBIN: 15.7 g/dL (ref 13.0–17.0)
Immature Granulocytes: 1 %
Lymphocytes Relative: 48 %
Lymphs Abs: 7.4 10*3/uL — ABNORMAL HIGH (ref 0.7–4.0)
MCH: 28.3 pg (ref 26.0–34.0)
MCHC: 31.5 g/dL (ref 30.0–36.0)
MCV: 89.7 fL (ref 80.0–100.0)
Monocytes Absolute: 0.7 10*3/uL (ref 0.1–1.0)
Monocytes Relative: 5 %
NEUTROS PCT: 44 %
Neutro Abs: 6.6 10*3/uL (ref 1.7–7.7)
Platelet Count: 156 10*3/uL (ref 150–400)
RBC: 5.55 MIL/uL (ref 4.22–5.81)
RDW: 13.9 % (ref 11.5–15.5)
WBC Count: 15 10*3/uL — ABNORMAL HIGH (ref 4.0–10.5)
nRBC: 0 % (ref 0.0–0.2)

## 2018-08-05 MED ORDER — IOHEXOL 300 MG/ML  SOLN
100.0000 mL | Freq: Once | INTRAMUSCULAR | Status: AC | PRN
  Administered 2018-08-05: 100 mL via INTRAVENOUS

## 2018-08-05 MED ORDER — SODIUM CHLORIDE (PF) 0.9 % IJ SOLN
INTRAMUSCULAR | Status: AC
  Filled 2018-08-05: qty 50

## 2018-08-09 ENCOUNTER — Telehealth: Payer: Self-pay | Admitting: Oncology

## 2018-08-09 ENCOUNTER — Inpatient Hospital Stay: Payer: Medicare HMO | Admitting: Oncology

## 2018-08-09 VITALS — BP 118/61 | HR 70 | Temp 98.0°F | Resp 17 | Ht 73.0 in | Wt 238.1 lb

## 2018-08-09 DIAGNOSIS — Z794 Long term (current) use of insulin: Secondary | ICD-10-CM | POA: Diagnosis not present

## 2018-08-09 DIAGNOSIS — C8317 Mantle cell lymphoma, spleen: Secondary | ICD-10-CM | POA: Diagnosis not present

## 2018-08-09 DIAGNOSIS — E119 Type 2 diabetes mellitus without complications: Secondary | ICD-10-CM | POA: Diagnosis not present

## 2018-08-09 DIAGNOSIS — K274 Chronic or unspecified peptic ulcer, site unspecified, with hemorrhage: Secondary | ICD-10-CM

## 2018-08-09 DIAGNOSIS — N289 Disorder of kidney and ureter, unspecified: Secondary | ICD-10-CM | POA: Diagnosis not present

## 2018-08-09 NOTE — Telephone Encounter (Signed)
Gave patient avs report and appointments for March  °

## 2018-08-09 NOTE — Progress Notes (Signed)
Hematology and Oncology Follow Up Visit  Micheal Thompson [id number] Jul 21, 1951 67 y.o. 08/09/2018 10:34 AM   Principle Diagnosis: 67 year old man with stage IIIA mantle cell lymphoma diagnosed in July 2014.  He presented with lymphocytosis and splenomegaly.  Prior therapy: Chemotherapy with  bendamustine and rituximab started on 02/28/2013. He is S/P 5 cycles completed in 07/2013. He achieved complete response at that time.  He developed relapsed disease in August 2018.  Current therapy: Ibrutinib 560 mg daily started in 03/2017.  Interim History: Micheal Thompson presents today for a follow-up.  Since the last visit, he reports no major complaints.  He continues to have issues with ibrutinib with dermatological toxicities.  He has reported multiple bruising and bleeding ulcers predominantly on his hands and toes.  He denies any palpitation or cardiac issues.  He denies dyspnea on exertion or shortness of breath.  He denies any palpable adenopathy.  His quality of life and form status is maintained.   He does not report any headaches or blurry vision or double vision.  He denies any alteration mental status or confusion.  He denies any fevers or chills or sweats.  Does not report any chest pain, palpitation orthopnea.  Does not report any cough, wheezing or hemoptysis.  He does not report any nausea, vomiting or early satiety.  He denies any changes in bowel habits.  Does not report any frequency urgency or hesitancy. Does not report any ecchymosis or bleeding.  He does not report any mood changes.  His rest of review of systems is negative.  Medications: I have reviewed the patient's current medications.  Current Outpatient Medications  Medication Sig Dispense Refill  . atorvastatin (LIPITOR) 80 MG tablet Take 80 mg by mouth every evening.     . B Complex-C (B-COMPLEX WITH VITAMIN C) tablet Take 1 tablet by mouth daily.    . ferrous sulfate 325 (65 FE) MG EC tablet Take 325 mg by mouth daily with  breakfast.    . IMBRUVICA 560 MG TABS TAKE 1 TABLET (560MG ) BY MOUTH DAILY 28 tablet 0  . insulin regular human CONCENTRATED (HUMULIN R) 500 UNIT/ML SOLN injection Inject 6-16 Units into the skin 3 (three) times daily with meals. 12/14:  Pt with vial & drawing up in a U-100 syringe:  6 units (= 30 units)  in the morning, 16 units (= 80 units) lunch, 10 units (= 50 units) at night. May adjust the 10 unit (50 unit) night dose using sliding scale as needed.    Marland Kitchen JARDIANCE 25 MG TABS tablet Take 25 mg by mouth at bedtime.     Marland Kitchen lisinopril (PRINIVIL,ZESTRIL) 10 MG tablet Take 10 mg by mouth at bedtime.     . Multiple Vitamins-Minerals (CENTRUM PO) Take by mouth daily.    Marland Kitchen oxyCODONE (OXY IR/ROXICODONE) 5 MG immediate release tablet Take 1-2 tablets (5-10 mg total) by mouth every 4 (four) hours as needed for moderate pain. (Patient not taking: Reported on 03/22/2017) 40 tablet 0  . sertraline (ZOLOFT) 100 MG tablet Take 100 mg by mouth daily.    . traZODone (DESYREL) 100 MG tablet Take 100 mg by mouth at bedtime.     No current facility-administered medications for this visit.      Allergies:  Allergies  Allergen Reactions  . No Known Allergies Other (See Comments)    Previously listed PCN Allergy not active.   Per Pt interview, "has taken a number of PCN derivatives" Taken Amoxicillin, Cephalosporins.    Past  Medical History, Surgical history, Social history, and Family History were reviewed and updated.    Physical Exam:  Blood pressure 118/61, pulse 70, temperature 98 F (36.7 C), temperature source Oral, resp. rate 17, height 6\' 1"  (1.854 m), weight 238 lb 1.6 oz (108 kg), SpO2 98 %.    ECOG:1   General appearance: Alert, awake without any distress. Head: Atraumatic without abnormalities Oropharynx: Without any thrush or ulcers. Eyes: No scleral icterus. Lymph nodes: No lymphadenopathy noted in the cervical, supraclavicular, or axillary nodes Heart:regular rate and rhythm,  without any murmurs or gallops.   Lung: Clear to auscultation without any rhonchi, wheezes or dullness to percussion. Abdomin: Soft, nontender without any shifting dullness or ascites. Musculoskeletal: No clubbing or cyanosis. Neurological: No motor or sensory deficits. Skin: Diffuse ecchymosis noted with blood blisters noted multiple areas.  Erythema noted on his chest wall.    CBC    Component Value Date/Time   WBC 15.0 (H) 08/05/2018 1142   WBC 15.4 (H) 11/26/2017 0850   RBC 5.55 08/05/2018 1142   HGB 15.7 08/05/2018 1142   HGB 16.2 08/13/2017 1236   HCT 49.8 08/05/2018 1142   HCT 50.8 (H) 08/13/2017 1236   PLT 156 08/05/2018 1142   PLT 160 08/13/2017 1236   MCV 89.7 08/05/2018 1142   MCV 89.0 08/13/2017 1236   MCH 28.3 08/05/2018 1142   MCHC 31.5 08/05/2018 1142   RDW 13.9 08/05/2018 1142   RDW 15.2 (H) 08/13/2017 1236   LYMPHSABS 7.4 (H) 08/05/2018 1142   LYMPHSABS 35.1 (H) 08/13/2017 1236   MONOABS 0.7 08/05/2018 1142   MONOABS 1.8 (H) 08/13/2017 1236   EOSABS 0.1 08/05/2018 1142   EOSABS 0.1 08/13/2017 1236   BASOSABS 0.1 08/05/2018 1142   BASOSABS 0.1 08/13/2017 1236      Chemistry      Component Value Date/Time   NA 139 08/05/2018 1142   NA 135 (L) 08/13/2017 1236   K 4.9 08/05/2018 1142   K 5.0 08/13/2017 1236   CL 104 08/05/2018 1142   CO2 29 08/05/2018 1142   CO2 22 08/13/2017 1236   BUN 19 08/05/2018 1142   BUN 28.7 (H) 08/13/2017 1236   CREATININE 1.21 08/05/2018 1142   CREATININE 1.5 (H) 08/13/2017 1236      Component Value Date/Time   CALCIUM 8.7 (L) 08/05/2018 1142   CALCIUM 8.8 08/13/2017 1236   ALKPHOS 101 08/05/2018 1142   ALKPHOS 109 08/13/2017 1236   AST 26 08/05/2018 1142   AST 21 08/13/2017 1236   ALT 28 08/05/2018 1142   ALT 21 08/13/2017 1236   BILITOT 0.8 08/05/2018 1142   BILITOT 1.44 (H) 08/13/2017 1236      EXAM: CT CHEST, ABDOMEN, AND PELVIS WITH CONTRAST  TECHNIQUE: Multidetector CT imaging of the chest, abdomen  and pelvis was performed following the standard protocol during bolus administration of intravenous contrast.  CONTRAST:  185mL OMNIPAQUE IOHEXOL 300 MG/ML  SOLN  COMPARISON:  09/03/2017 CT abdomen/pelvis. 10/02/2017 chest CT angiogram.  FINDINGS: CT CHEST FINDINGS  Cardiovascular: Normal heart size. No significant pericardial effusion/thickening. Left main and 3 vessel coronary atherosclerosis. Atherosclerotic nonaneurysmal thoracic aorta. Normal caliber pulmonary arteries. No central pulmonary emboli.  Mediastinum/Nodes: No discrete thyroid nodules. Unremarkable esophagus. No axillary adenopathy. Mildly enlarged 1.2 cm right paratracheal node (series 2/image 23), decreased 0.6 cm on 10/02/2017 chest CT angiogram. No additional pathologically enlarged mediastinal nodes. No hilar adenopathy.  Lungs/Pleura: No pneumothorax. No pleural effusion. Stable mild cylindrical bronchiectasis in the lingula.  No acute consolidative airspace disease or lung masses. Sub solid 1 4 cm medial basilar right lower lobe pulmonary nodule (series 4/image 124), not definitely seen on 10/02/2017 chest CT. No additional significant pulmonary nodules.  Musculoskeletal: No aggressive appearing focal osseous lesions. Mild thoracic spondylosis  CT ABDOMEN PELVIS FINDINGS  Hepatobiliary: Normal liver with no liver mass. Cholecystectomy. No biliary ductal dilatation.  Pancreas: Normal, with no mass or duct dilation.  Spleen: Mildly enlarged spleen measuring 14.4 x 6.9 x 13.1 cm (volume = 680 cm^3), previously measuring 14.7 x 8.0 x 14.4 cm (volume = 890 cm^3) on 09/03/2017 CT, decreased. No splenic mass.  Adrenals/Urinary Tract: Normal adrenals. No hydronephrosis. Exophytic simple 2.9 cm upper right renal cyst. Simple 1.1 cm interpolar right renal cyst. Slightly hypodense 1.8 cm renal cortical lesion in the upper left kidney (series 7/image 21), stable in size. No new renal lesions.  Normal bladder.  Stomach/Bowel: Normal non-distended stomach. Normal caliber small bowel with no small bowel wall thickening. Appendectomy. Scattered mild left colonic diverticulosis, with no large bowel wall thickening or acute pericolonic fat stranding. Epiploic appendagitis associated with the descending colon, which appears chronic.  Vascular/Lymphatic: Atherosclerotic nonaneurysmal abdominal aorta. Patent portal, splenic and renal veins. No pathologically enlarged lymph nodes in the abdomen or pelvis.  Reproductive: Mildly enlarged prostate, unchanged.  Other: No pneumoperitoneum, ascites or focal fluid collection. Stable postsurgical changes from ventral right abdominal hernia repair with no evidence of recurrent hernia.  Musculoskeletal: No aggressive appearing focal osseous lesions. Mild lumbar spondylosis.  IMPRESSION: 1. Mild splenomegaly has decreased since 09/03/2017 CT. 2. Mild mediastinal lymphadenopathy is decreased since 10/02/2017 CT. 3. No residual or recurrent lymphadenopathy in the abdomen or pelvis. 4. Subsolid 1.4 cm right lung base pulmonary nodule is new, possibly inflammatory, recommend attention on follow-up chest CT in 3-6 months. 5. Indeterminate hypodense 1.8 cm upper left renal cortical lesion, stable in size since 09/03/2017 CT, differential includes benign complex renal cysts versus indolent renal cell carcinoma. MRI abdomen without and with IV contrast may be obtained for further characterization. 6.  Aortic Atherosclerosis (ICD10-I70.0).    67 year old man with:  1. Mantle cell lymphoma presented with stage IIIa disease in 2014.  He was found to have lymphocytosis and splenomegaly.   He remains on Imbruvica without any major complications.  He is lymphocytosis has improved and nearly resolved at this time.  CT scan obtained on 08/05/2018 was personally reviewed and showed improvement in his splenomegaly and lymphadenopathy.  Risks and  benefits of continuing this therapy long-term was reviewed as well as a treatment holiday was also discussed.  After discussion today, he prefers to hold the treatment for at least 3 months and reevaluate at that time.  That will help with his dermatological toxicity as well as overall quality of life.  He understands that we might to restart therapy at the time of his lymphocytosis has worsened.  2. Diabetes mellitus: No recent exacerbation noted at this time.  3. Renal insufficiency: Creatinine clearance is normal at this time without any recent issues.  4. Hyperkalemia: Potassium is within normal range without any abnormalities.  5. Cardiac monitoring: He denies any recent cardiac issues including palpitation or arrhythmia.  6. Followup: In 3 months to reevaluate his disease status.  25  minutes was spent with the patient face-to-face today.  More than 50% of time was dedicated to discussing his disease status, imaging studies as well as alternative therapy options.   Zola Button, MD 12/31/201910:34 AM

## 2018-09-05 DIAGNOSIS — H7111 Cholesteatoma of tympanum, right ear: Secondary | ICD-10-CM | POA: Diagnosis not present

## 2018-09-05 DIAGNOSIS — H6981 Other specified disorders of Eustachian tube, right ear: Secondary | ICD-10-CM | POA: Diagnosis not present

## 2018-09-06 DIAGNOSIS — E782 Mixed hyperlipidemia: Secondary | ICD-10-CM | POA: Diagnosis not present

## 2018-09-06 DIAGNOSIS — Z794 Long term (current) use of insulin: Secondary | ICD-10-CM | POA: Diagnosis not present

## 2018-09-06 DIAGNOSIS — E1142 Type 2 diabetes mellitus with diabetic polyneuropathy: Secondary | ICD-10-CM | POA: Diagnosis not present

## 2018-09-06 DIAGNOSIS — E1165 Type 2 diabetes mellitus with hyperglycemia: Secondary | ICD-10-CM | POA: Diagnosis not present

## 2018-09-27 DIAGNOSIS — E1165 Type 2 diabetes mellitus with hyperglycemia: Secondary | ICD-10-CM | POA: Diagnosis not present

## 2018-10-11 DIAGNOSIS — R5381 Other malaise: Secondary | ICD-10-CM | POA: Diagnosis not present

## 2018-10-11 DIAGNOSIS — E1165 Type 2 diabetes mellitus with hyperglycemia: Secondary | ICD-10-CM | POA: Diagnosis not present

## 2018-10-11 DIAGNOSIS — Z794 Long term (current) use of insulin: Secondary | ICD-10-CM | POA: Diagnosis not present

## 2018-10-11 DIAGNOSIS — E1142 Type 2 diabetes mellitus with diabetic polyneuropathy: Secondary | ICD-10-CM | POA: Diagnosis not present

## 2018-11-01 ENCOUNTER — Telehealth: Payer: Self-pay | Admitting: Oncology

## 2018-11-01 NOTE — Telephone Encounter (Signed)
Cancelled per 3/24 sch message - pt called in to cancel appt due to covid19 - pt states he will call back to r/s appt.

## 2018-11-08 ENCOUNTER — Ambulatory Visit: Payer: Medicare HMO | Admitting: Oncology

## 2018-11-08 ENCOUNTER — Other Ambulatory Visit: Payer: Medicare HMO

## 2018-12-09 DIAGNOSIS — J439 Emphysema, unspecified: Secondary | ICD-10-CM | POA: Diagnosis not present

## 2018-12-09 DIAGNOSIS — Z978 Presence of other specified devices: Secondary | ICD-10-CM | POA: Diagnosis not present

## 2018-12-09 DIAGNOSIS — K4090 Unilateral inguinal hernia, without obstruction or gangrene, not specified as recurrent: Secondary | ICD-10-CM | POA: Diagnosis not present

## 2018-12-09 DIAGNOSIS — Y998 Other external cause status: Secondary | ICD-10-CM | POA: Diagnosis not present

## 2018-12-09 DIAGNOSIS — S270XXD Traumatic pneumothorax, subsequent encounter: Secondary | ICD-10-CM | POA: Diagnosis not present

## 2018-12-09 DIAGNOSIS — S2241XA Multiple fractures of ribs, right side, initial encounter for closed fracture: Secondary | ICD-10-CM | POA: Diagnosis not present

## 2018-12-09 DIAGNOSIS — W19XXXA Unspecified fall, initial encounter: Secondary | ICD-10-CM | POA: Diagnosis not present

## 2018-12-09 DIAGNOSIS — E782 Mixed hyperlipidemia: Secondary | ICD-10-CM | POA: Diagnosis not present

## 2018-12-09 DIAGNOSIS — D696 Thrombocytopenia, unspecified: Secondary | ICD-10-CM | POA: Diagnosis not present

## 2018-12-09 DIAGNOSIS — E119 Type 2 diabetes mellitus without complications: Secondary | ICD-10-CM | POA: Diagnosis not present

## 2018-12-09 DIAGNOSIS — S301XXA Contusion of abdominal wall, initial encounter: Secondary | ICD-10-CM | POA: Diagnosis not present

## 2018-12-09 DIAGNOSIS — T797XXA Traumatic subcutaneous emphysema, initial encounter: Secondary | ICD-10-CM | POA: Diagnosis not present

## 2018-12-09 DIAGNOSIS — E1142 Type 2 diabetes mellitus with diabetic polyneuropathy: Secondary | ICD-10-CM | POA: Diagnosis not present

## 2018-12-09 DIAGNOSIS — S0990XA Unspecified injury of head, initial encounter: Secondary | ICD-10-CM | POA: Diagnosis not present

## 2018-12-09 DIAGNOSIS — J939 Pneumothorax, unspecified: Secondary | ICD-10-CM | POA: Diagnosis not present

## 2018-12-09 DIAGNOSIS — S270XXA Traumatic pneumothorax, initial encounter: Secondary | ICD-10-CM | POA: Diagnosis not present

## 2018-12-09 DIAGNOSIS — Z794 Long term (current) use of insulin: Secondary | ICD-10-CM | POA: Diagnosis not present

## 2018-12-09 DIAGNOSIS — J90 Pleural effusion, not elsewhere classified: Secondary | ICD-10-CM | POA: Diagnosis not present

## 2018-12-09 DIAGNOSIS — W11XXXA Fall on and from ladder, initial encounter: Secondary | ICD-10-CM | POA: Diagnosis not present

## 2018-12-09 DIAGNOSIS — E785 Hyperlipidemia, unspecified: Secondary | ICD-10-CM | POA: Diagnosis not present

## 2018-12-09 DIAGNOSIS — E1165 Type 2 diabetes mellitus with hyperglycemia: Secondary | ICD-10-CM | POA: Diagnosis not present

## 2018-12-09 DIAGNOSIS — Z809 Family history of malignant neoplasm, unspecified: Secondary | ICD-10-CM | POA: Diagnosis not present

## 2018-12-09 DIAGNOSIS — J9811 Atelectasis: Secondary | ICD-10-CM | POA: Diagnosis not present

## 2018-12-09 DIAGNOSIS — S199XXA Unspecified injury of neck, initial encounter: Secondary | ICD-10-CM | POA: Diagnosis not present

## 2018-12-09 DIAGNOSIS — Z79899 Other long term (current) drug therapy: Secondary | ICD-10-CM | POA: Diagnosis not present

## 2018-12-09 DIAGNOSIS — S2241XD Multiple fractures of ribs, right side, subsequent encounter for fracture with routine healing: Secondary | ICD-10-CM | POA: Diagnosis not present

## 2018-12-09 DIAGNOSIS — J95821 Acute postprocedural respiratory failure: Secondary | ICD-10-CM | POA: Diagnosis not present

## 2018-12-09 DIAGNOSIS — G8918 Other acute postprocedural pain: Secondary | ICD-10-CM | POA: Diagnosis not present

## 2018-12-09 DIAGNOSIS — C8310 Mantle cell lymphoma, unspecified site: Secondary | ICD-10-CM | POA: Diagnosis not present

## 2018-12-09 DIAGNOSIS — G8912 Acute post-thoracotomy pain: Secondary | ICD-10-CM | POA: Diagnosis not present

## 2018-12-09 DIAGNOSIS — Z9641 Presence of insulin pump (external) (internal): Secondary | ICD-10-CM | POA: Diagnosis not present

## 2018-12-09 DIAGNOSIS — Z9981 Dependence on supplemental oxygen: Secondary | ICD-10-CM | POA: Diagnosis not present

## 2018-12-09 DIAGNOSIS — Z4682 Encounter for fitting and adjustment of non-vascular catheter: Secondary | ICD-10-CM | POA: Diagnosis not present

## 2018-12-09 DIAGNOSIS — N400 Enlarged prostate without lower urinary tract symptoms: Secondary | ICD-10-CM | POA: Diagnosis not present

## 2019-01-04 IMAGING — CT CT ABD-PELV W/ CM
2 of 5 series · 12 of 36 positions shown, 15 images · IV contrast (OMNIPAQUE)
Comparison: 09/03/2017 CT abdomen/pelvis. 10/02/2017 chest CT
angiogram.

CLINICAL DATA: Mantle cell lymphoma of the spleen diagnosed 3117
with ongoing oral chemotherapy. Restaging.

EXAM:
CT CHEST, ABDOMEN, AND PELVIS WITH CONTRAST
TECHNIQUE: Multidetector CT imaging of the chest, abdomen and pelvis was
performed following the standard protocol during bolus
administration of intravenous contrast.
CONTRAST:  100mL OMNIPAQUE IOHEXOL 300 MG/ML  SOLN

[Series 2: cap with · axial · 0.89mm/px · z∈[-735,-160]mm · 9 of 145 slices shown, 12 images]
[im 15/145  mediastinal]
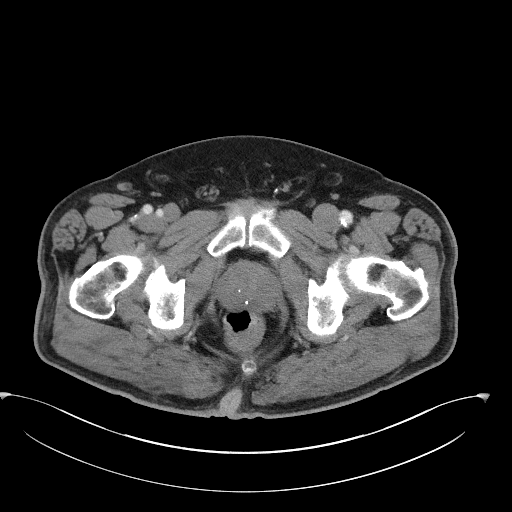
[im 15/145  lung]
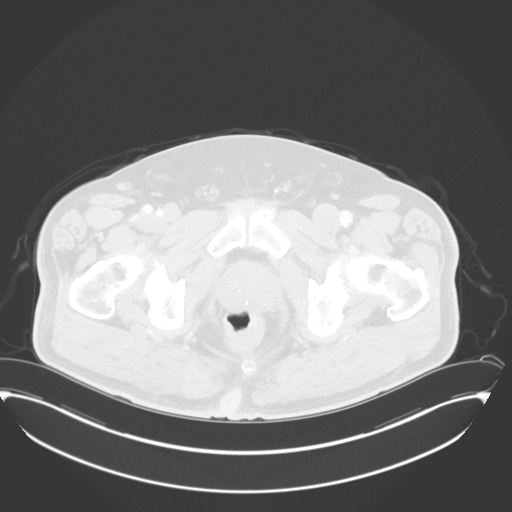
[im 29/145  lung]
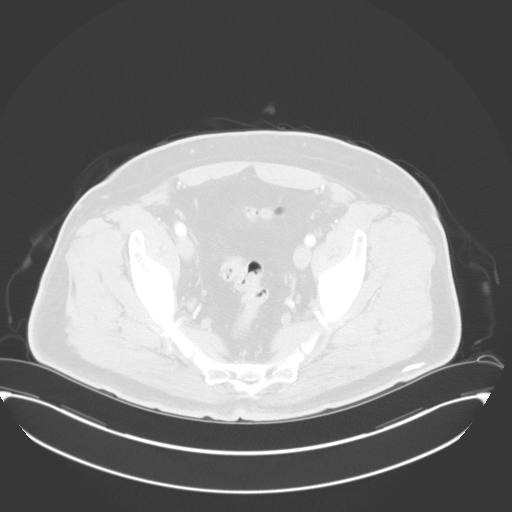
[im 44/145  lung]
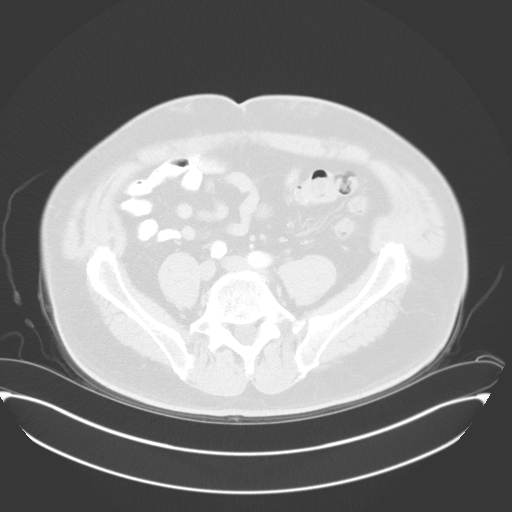
[im 58/145  lung]
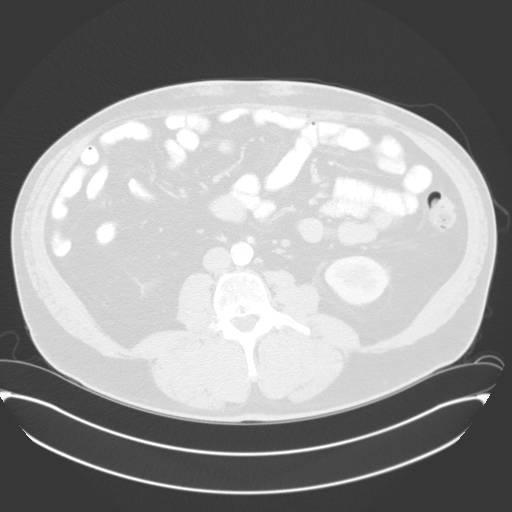
[im 73/145  mediastinal]
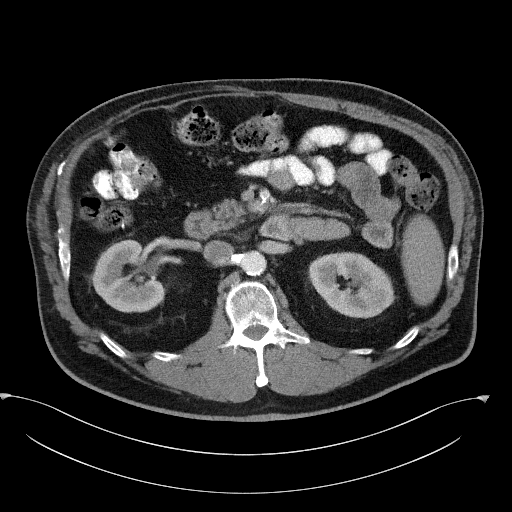
[im 73/145  lung]
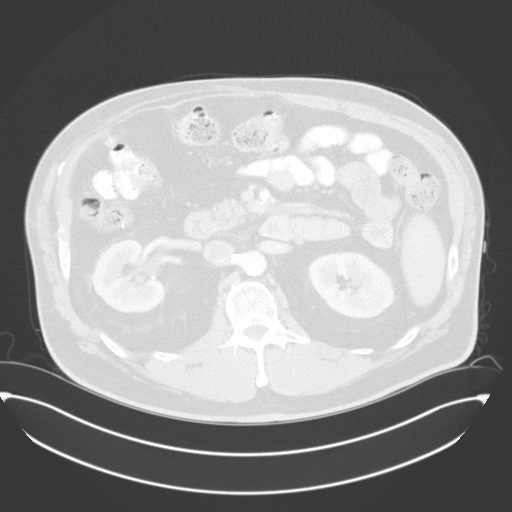
[im 87/145  lung]
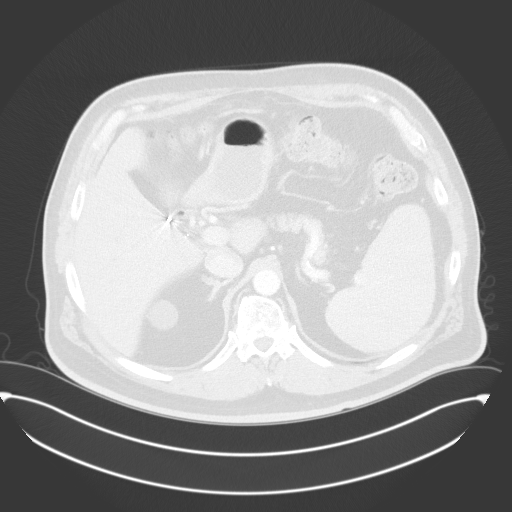
[im 101/145  lung]
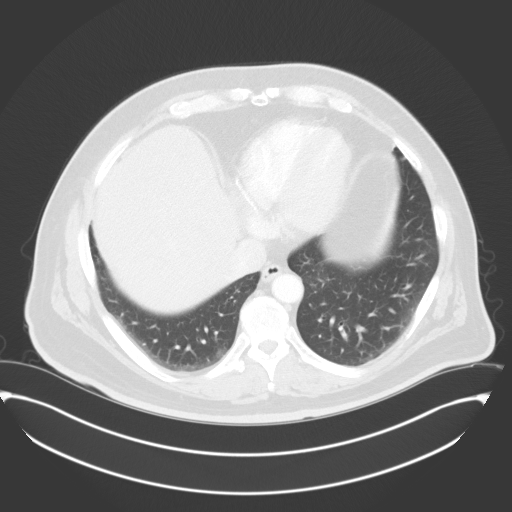
[im 116/145  lung]
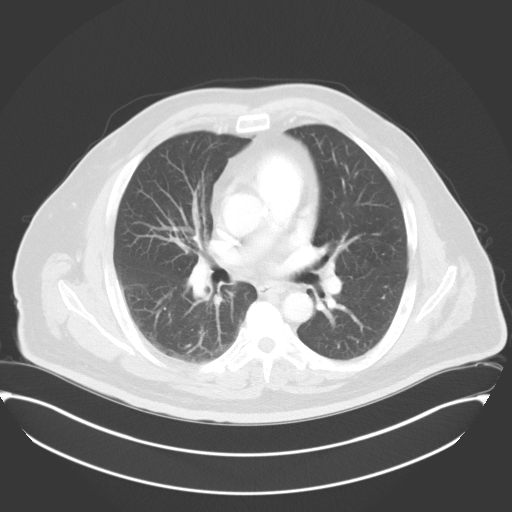
[im 130/145  mediastinal]
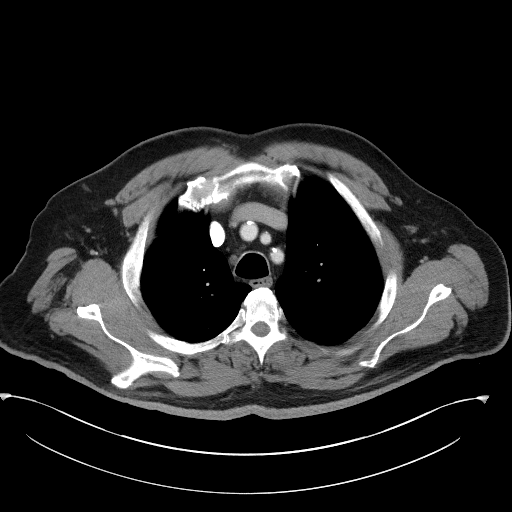
[im 130/145  lung]
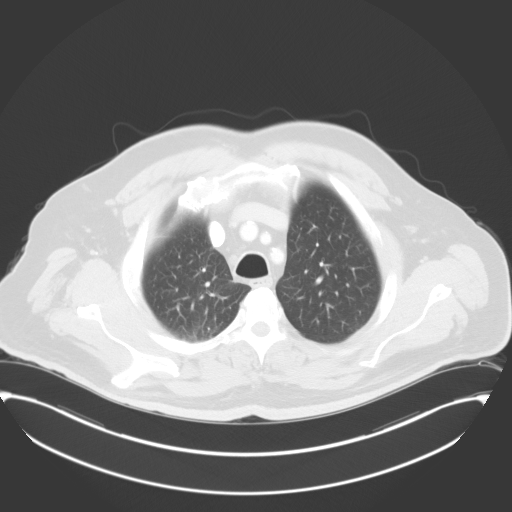

[Series 5: coronals · coronal · 0.89mm/px · 3 of 188 slices shown]
[im 38/188  lung]
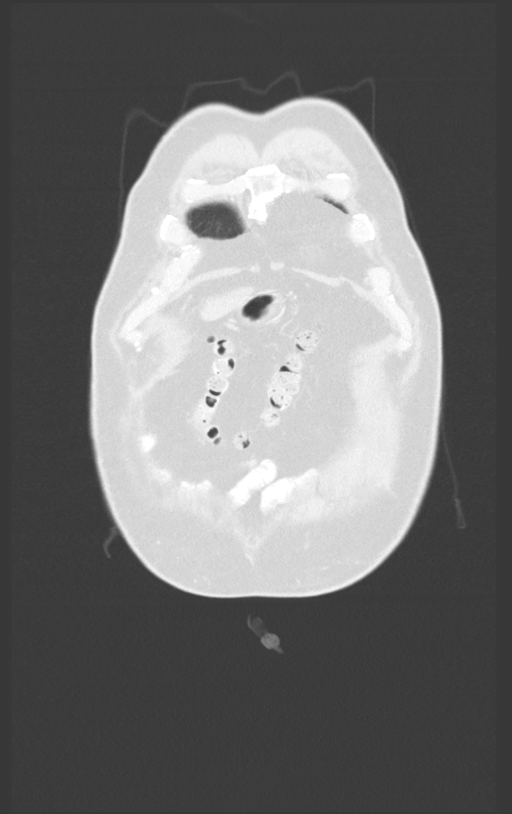
[im 75/188  lung]
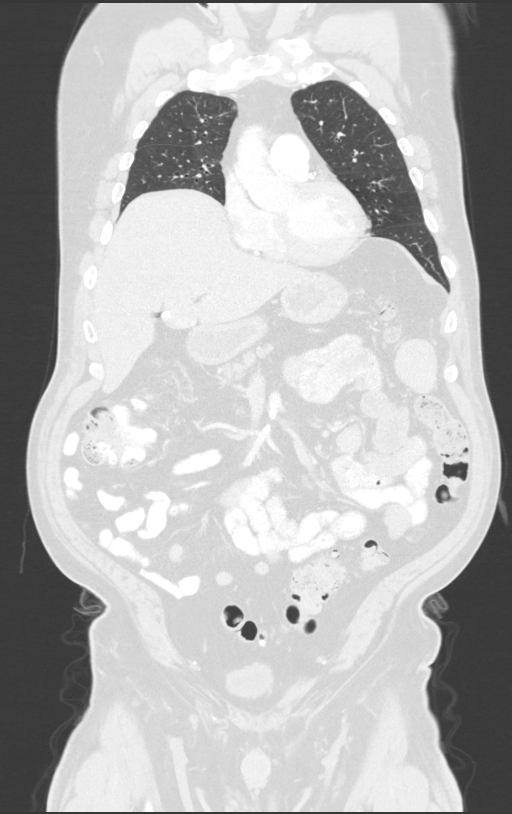
[im 113/188  lung]
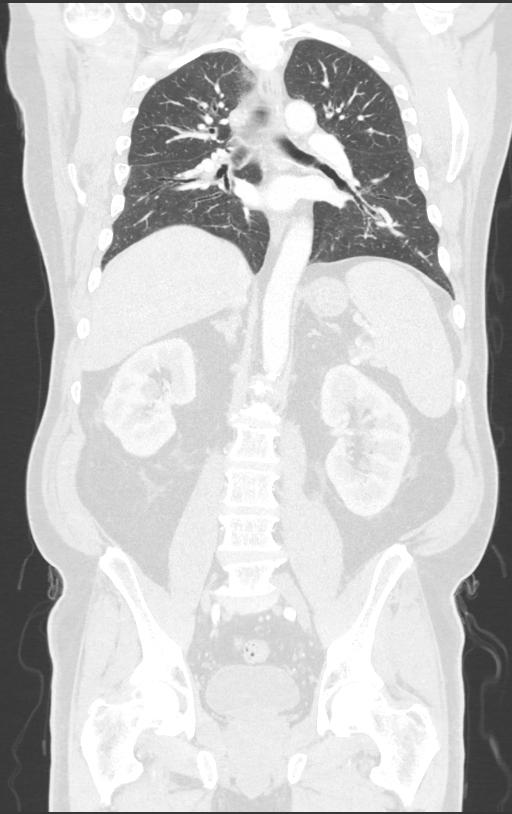

[12 of 36 positions shown; findings below may reference images not displayed]

FINDINGS: CT CHEST FINDINGS

Cardiovascular: Normal heart size. No significant pericardial
effusion/thickening. Left main and 3 vessel coronary
atherosclerosis. Atherosclerotic nonaneurysmal thoracic aorta.
Normal caliber pulmonary arteries. No central pulmonary emboli.

Mediastinum/Nodes: No discrete thyroid nodules. Unremarkable
esophagus. No axillary adenopathy. Mildly enlarged 1.2 cm right
paratracheal node (series 2/image 23), decreased 0.6 cm on
10/02/2017 chest CT angiogram. No additional pathologically enlarged
mediastinal nodes. No hilar adenopathy.

Lungs/Pleura: No pneumothorax. No pleural effusion. Stable mild
cylindrical bronchiectasis in the lingula. No acute consolidative
airspace disease or lung masses. Sub solid 1 4 cm medial basilar
right lower lobe pulmonary nodule (series 4/image 124), not
definitely seen on 10/02/2017 chest CT. No additional significant
pulmonary nodules.

Musculoskeletal: No aggressive appearing focal osseous lesions. Mild
thoracic spondylosis

CT ABDOMEN PELVIS FINDINGS

Hepatobiliary: Normal liver with no liver mass. Cholecystectomy. No
biliary ductal dilatation.

Pancreas: Normal, with no mass or duct dilation.

Spleen: Mildly enlarged spleen measuring 14.4 x 6.9 x 13.1 cm
(volume = 680 cm^3), previously measuring 14.7 x 8.0 x 14.4 cm
(volume = 890 cm^3) on 09/03/2017 CT, decreased. No splenic mass.

Adrenals/Urinary Tract: Normal adrenals. No hydronephrosis.
Exophytic simple 2.9 cm upper right renal cyst. Simple 1.1 cm
interpolar right renal cyst. Slightly hypodense 1.8 cm renal
cortical lesion in the upper left kidney (series 7/image 21), stable
in size. No new renal lesions. Normal bladder.

Stomach/Bowel: Normal non-distended stomach. Normal caliber small
bowel with no small bowel wall thickening. Appendectomy. Scattered
mild left colonic diverticulosis, with no large bowel wall
thickening or acute pericolonic fat stranding. Epiploic appendagitis
associated with the descending colon, which appears chronic.

Vascular/Lymphatic: Atherosclerotic nonaneurysmal abdominal aorta.
Patent portal, splenic and renal veins. No pathologically enlarged
lymph nodes in the abdomen or pelvis.

Reproductive: Mildly enlarged prostate, unchanged.

Other: No pneumoperitoneum, ascites or focal fluid collection.
Stable postsurgical changes from ventral right abdominal hernia
repair with no evidence of recurrent hernia.

Musculoskeletal: No aggressive appearing focal osseous lesions. Mild
lumbar spondylosis.
IMPRESSION: 1. Mild splenomegaly has decreased since 09/03/2017 CT.
2. Mild mediastinal lymphadenopathy is decreased since 10/02/2017
CT.
3. No residual or recurrent lymphadenopathy in the abdomen or
pelvis.
4. Subsolid 1.4 cm right lung base pulmonary nodule is new, possibly
inflammatory, recommend attention on follow-up chest CT in 3-6
months.
5. Indeterminate hypodense 1.8 cm upper left renal cortical lesion,
stable in size since 09/03/2017 CT, differential includes benign
complex renal cysts versus indolent renal cell carcinoma. MRI
abdomen without and with IV contrast may be obtained for further
characterization.
6.  Aortic Atherosclerosis ([id number]).

## 2019-01-05 DIAGNOSIS — X58XXXD Exposure to other specified factors, subsequent encounter: Secondary | ICD-10-CM | POA: Diagnosis not present

## 2019-01-05 DIAGNOSIS — S2241XD Multiple fractures of ribs, right side, subsequent encounter for fracture with routine healing: Secondary | ICD-10-CM | POA: Diagnosis not present

## 2019-01-06 DIAGNOSIS — S2241XD Multiple fractures of ribs, right side, subsequent encounter for fracture with routine healing: Secondary | ICD-10-CM | POA: Diagnosis not present

## 2019-01-06 DIAGNOSIS — R69 Illness, unspecified: Secondary | ICD-10-CM | POA: Diagnosis not present

## 2019-01-24 DIAGNOSIS — R1312 Dysphagia, oropharyngeal phase: Secondary | ICD-10-CM | POA: Diagnosis not present

## 2019-01-24 DIAGNOSIS — Z23 Encounter for immunization: Secondary | ICD-10-CM | POA: Diagnosis not present

## 2019-01-24 DIAGNOSIS — Z125 Encounter for screening for malignant neoplasm of prostate: Secondary | ICD-10-CM | POA: Diagnosis not present

## 2019-01-24 DIAGNOSIS — R221 Localized swelling, mass and lump, neck: Secondary | ICD-10-CM | POA: Diagnosis not present

## 2019-01-24 DIAGNOSIS — Z0000 Encounter for general adult medical examination without abnormal findings: Secondary | ICD-10-CM | POA: Diagnosis not present

## 2019-02-21 DIAGNOSIS — B029 Zoster without complications: Secondary | ICD-10-CM | POA: Diagnosis not present

## 2019-02-21 DIAGNOSIS — Z789 Other specified health status: Secondary | ICD-10-CM | POA: Diagnosis not present

## 2019-03-02 DIAGNOSIS — E1165 Type 2 diabetes mellitus with hyperglycemia: Secondary | ICD-10-CM | POA: Diagnosis not present

## 2019-03-09 DIAGNOSIS — H7111 Cholesteatoma of tympanum, right ear: Secondary | ICD-10-CM | POA: Diagnosis not present

## 2019-03-09 DIAGNOSIS — C8311 Mantle cell lymphoma, lymph nodes of head, face, and neck: Secondary | ICD-10-CM | POA: Diagnosis not present

## 2019-03-09 DIAGNOSIS — C8310 Mantle cell lymphoma, unspecified site: Secondary | ICD-10-CM | POA: Diagnosis not present

## 2019-03-10 ENCOUNTER — Telehealth: Payer: Self-pay

## 2019-03-10 NOTE — Telephone Encounter (Signed)
-----   Message from Wyatt Portela, MD sent at 03/10/2019 10:14 AM EDT ----- Regarding: RE: patient concern I need to see results before deciding to change of date. Thanks.  ----- Message ----- From: Scot Dock, RN Sent: 03/10/2019   9:53 AM EDT To: Wyatt Portela, MD Subject: patient concern                                Stated he saw ENT - Dr. Laurance Flatten with [facility] and had a malignant tumor in the back of his throat biopsied. Results should be back on Monday. He has lab+MD on 8/14 and is questioning if he needs to be seen sooner due to above info.

## 2019-03-10 NOTE — Telephone Encounter (Signed)
Contacted patient and made him aware that Dr. [name] will need to see the results before deciding if the appt needs to be changed. Explained that when the ENT office calls to provide results to ask them to fax the results to this office attn: Dr. [name]. Patient verbalized understanding and had no other questions or concerns.

## 2019-03-16 ENCOUNTER — Telehealth: Payer: Self-pay | Admitting: Oncology

## 2019-03-16 NOTE — Telephone Encounter (Signed)
Called patient regarding rescheduled appointments from 08/14 to 08/21, patient is notified. Calender will be nailed.

## 2019-03-21 ENCOUNTER — Telehealth: Payer: Self-pay

## 2019-03-21 NOTE — Telephone Encounter (Signed)
-----   Message from Wyatt Portela, MD sent at 03/21/2019  7:07 AM EDT ----- Regarding: RE: appt 8/21 is fine unless he is sick or has new symptoms. Thanks.  ----- Message ----- From: Scot Dock, RN Sent: 03/20/2019   2:08 PM EDT To: Wyatt Portela, MD Subject: appt                                           Patient stated biopsy came back positive per Dr. Laurance Flatten. Appt lab/MD on 8/21. He is questioning if he needs to be seen sooner. Thanks

## 2019-03-21 NOTE — Telephone Encounter (Signed)
Contacted patient and made aware that will keep the appointment for 8/21 lab and MD. Patient stated no new or different concerns at this time. He had no other questions or concerns.

## 2019-03-24 ENCOUNTER — Other Ambulatory Visit: Payer: Medicare HMO

## 2019-03-24 ENCOUNTER — Ambulatory Visit: Payer: Medicare HMO | Admitting: Oncology

## 2019-03-31 ENCOUNTER — Inpatient Hospital Stay: Payer: Medicare HMO | Attending: Oncology

## 2019-03-31 ENCOUNTER — Telehealth: Payer: Self-pay | Admitting: Pharmacist

## 2019-03-31 ENCOUNTER — Other Ambulatory Visit: Payer: Self-pay

## 2019-03-31 ENCOUNTER — Inpatient Hospital Stay: Payer: Medicare HMO | Admitting: Oncology

## 2019-03-31 VITALS — BP 136/81 | HR 101 | Temp 97.8°F | Resp 18 | Ht 73.0 in | Wt 217.1 lb

## 2019-03-31 DIAGNOSIS — C8311 Mantle cell lymphoma, lymph nodes of head, face, and neck: Secondary | ICD-10-CM | POA: Insufficient documentation

## 2019-03-31 DIAGNOSIS — C8317 Mantle cell lymphoma, spleen: Secondary | ICD-10-CM

## 2019-03-31 LAB — CMP (CANCER CENTER ONLY)
ALT: 12 U/L (ref 0–44)
AST: 20 U/L (ref 15–41)
Albumin: 4.2 g/dL (ref 3.5–5.0)
Alkaline Phosphatase: 181 U/L — ABNORMAL HIGH (ref 38–126)
Anion gap: 14 (ref 5–15)
BUN: 35 mg/dL — ABNORMAL HIGH (ref 8–23)
CO2: 22 mmol/L (ref 22–32)
Calcium: 9 mg/dL (ref 8.9–10.3)
Chloride: 100 mmol/L (ref 98–111)
Creatinine: 1.77 mg/dL — ABNORMAL HIGH (ref 0.61–1.24)
GFR, Est AFR Am: 45 mL/min — ABNORMAL LOW (ref >60)
GFR, Estimated: 39 mL/min — ABNORMAL LOW (ref >60)
Glucose, Bld: 297 mg/dL — ABNORMAL HIGH (ref 70–99)
Potassium: 5.2 mmol/L — ABNORMAL HIGH (ref 3.5–5.1)
Sodium: 136 mmol/L (ref 135–145)
Total Bilirubin: 0.7 mg/dL (ref 0.3–1.2)
Total Protein: 7.3 g/dL (ref 6.5–8.1)

## 2019-03-31 LAB — CBC WITH DIFFERENTIAL (CANCER CENTER ONLY)
Abs Immature Granulocytes: 0.02 10*3/uL (ref 0.00–0.07)
Basophils Absolute: 0 10*3/uL (ref 0.0–0.1)
Basophils Relative: 0 %
Eosinophils Absolute: 0.1 10*3/uL (ref 0.0–0.5)
Eosinophils Relative: 1 %
HCT: 47.6 % (ref 39.0–52.0)
Hemoglobin: 15.4 g/dL (ref 13.0–17.0)
Immature Granulocytes: 0 %
Lymphocytes Relative: 48 %
Lymphs Abs: 4.5 10*3/uL — ABNORMAL HIGH (ref 0.7–4.0)
MCH: 26.3 pg (ref 26.0–34.0)
MCHC: 32.4 g/dL (ref 30.0–36.0)
MCV: 81.4 fL (ref 80.0–100.0)
Monocytes Absolute: 1.4 10*3/uL — ABNORMAL HIGH (ref 0.1–1.0)
Monocytes Relative: 14 %
Neutro Abs: 3.5 10*3/uL (ref 1.7–7.7)
Neutrophils Relative %: 37 %
Platelet Count: 112 10*3/uL — ABNORMAL LOW (ref 150–400)
RBC: 5.85 MIL/uL — ABNORMAL HIGH (ref 4.22–5.81)
RDW: 13.9 % (ref 11.5–15.5)
WBC Count: 9.5 10*3/uL (ref 4.0–10.5)
nRBC: 0 % (ref 0.0–0.2)

## 2019-03-31 MED ORDER — IMBRUVICA 560 MG PO TABS: 560.0000 mg | ORAL_TABLET | Freq: Every day | ORAL | 0 refills | Status: DC

## 2019-03-31 NOTE — Telephone Encounter (Signed)
Oral Oncology Pharmacist Encounter  Received new prescription for Imbruvica (ibrutinib) for the treatment of recurrent mantle cell lymphoma, planned duration until disease progression or unacceptable toxicity.  Original diagnosis in 2014, treated with bendamustine and rituximab at that time with complete response First disease recurrence noted in Sept 2018 and patient was placed on therapy with ibrutinib 560mg  once daily 03/26/17-08/09/2018. Ibrutinib was discontinued per patient request as his disease was in remission and he had been managing several ADEs including bruising/bleeding, dermatologic toxicities, and progressive fatigue. Patient was placed on active surveillance. Patient now with documented tonsillar disease recurrence and is under evaluation to initiate salvage therapy (biopsy proven on 03/09/2019), again with ibrutinib 560mg  once daily  Labs from 03/31/19 assessed, OK for treatment initiation. Noted pltc=112k, will continue to be monitored SCr=1.77, est CrCl ~ 50 mL/min, no dose adjustment per manufacturer for renal dysfunction  Current medication list in Epic reviewed, moderate DDI with ibrutinib and sertraline identified:  Category C interaction: both agents with anti-platelet properties. Patient was previously on these medications concurrently. Patient will be counseled about risk of bruising/bleeding, platelet count will be monitored. No change to current therapy indicated at this time.  Prescription has been e-scribed to the [facility] for benefits analysis and approval by MD.  Oral Oncology Clinic will continue to follow for insurance authorization, copayment issues, initial counseling and start date.  Johny Drilling, PharmD, BCPS, BCOP  03/31/2019 3:37 PM Oral Oncology Clinic [phone]

## 2019-03-31 NOTE — Progress Notes (Signed)
Hematology and Oncology Follow Up Visit  Micheal Thompson [id number] 1950-09-20 68 y.o. 03/31/2019 3:19 PM   Principle Diagnosis: 68 year old man with mantle cell lymphoma diagnosed in July 2014 after presenting with lymphocytosis and splenomegaly.  He also was found to have stage IIIA disease.  Prior therapy:   Chemotherapy with  bendamustine and rituximab started on 02/28/2013. He is S/P 5 cycles completed in 07/2013. He achieved complete response at that time.  He developed relapsed disease in August 2018.  Ibrutinib 560 mg daily started in 03/2017.  Therapy withheld in December 2019 based on his request.  Current therapy: Under consideration to start salvage therapy.  Interim History: Micheal Thompson is here for a follow-up visit.  Since the last visit, he started developing sore throat and difficulty swallowing and have resulted in significant weight loss.  He was evaluated by ENT and underwent tonsillar biopsy on July 30.  The left tonsillar mass showed mantle cell lymphoma consistent with his original disease.  He denies any constitutional symptoms including fevers or chills or sweats.  He denies any palpable adenopathy at this time.  His appetite has been reasonable although he is not able to eat solid foods.  He has been relying on nutritional supplements mostly.  Patient denied any alteration mental status, neuropathy, confusion or dizziness.  Denies any headaches or lethargy.  Denies any night sweats, weight loss or changes in appetite.  Denied orthopnea, dyspnea on exertion or chest discomfort.  Denies shortness of breath, difficulty breathing hemoptysis or cough.  Denies any abdominal distention, nausea, early satiety or dyspepsia.  Denies any hematuria, frequency, dysuria or nocturia.  Denies any skin irritation, dryness or rash.  Denies any ecchymosis or petechiae.  Denies any lymphadenopathy or clotting.  Denies any heat or cold intolerance.  Denies any anxiety or depression.  Remaining  review of system is negative.      Medications: Updated on review. Current Outpatient Medications  Medication Sig Dispense Refill  . albuterol (PROVENTIL HFA;VENTOLIN HFA) 108 (90 Base) MCG/ACT inhaler Inhale into the lungs.    Marland Kitchen atorvastatin (LIPITOR) 80 MG tablet Take 80 mg by mouth every evening.     . B Complex-C (B-COMPLEX WITH VITAMIN C) tablet Take 1 tablet by mouth daily.    . ferrous sulfate 325 (65 FE) MG EC tablet Take 325 mg by mouth daily with breakfast.    . IMBRUVICA 560 MG TABS TAKE 1 TABLET (560MG ) BY MOUTH DAILY 28 tablet 0  . insulin regular human CONCENTRATED (HUMULIN R) 500 UNIT/ML SOLN injection Inject 6-16 Units into the skin 3 (three) times daily with meals. 12/14:  Pt with vial & drawing up in a U-100 syringe:  6 units (= 30 units)  in the morning, 16 units (= 80 units) lunch, 10 units (= 50 units) at night. May adjust the 10 unit (50 unit) night dose using sliding scale as needed.    Marland Kitchen JARDIANCE 25 MG TABS tablet Take 25 mg by mouth at bedtime.     Marland Kitchen lisinopril (PRINIVIL,ZESTRIL) 10 MG tablet Take 10 mg by mouth at bedtime.     . Multiple Vitamins-Minerals (CENTRUM PO) Take by mouth daily.    Marland Kitchen oxyCODONE (OXY IR/ROXICODONE) 5 MG immediate release tablet Take 1-2 tablets (5-10 mg total) by mouth every 4 (four) hours as needed for moderate pain. 40 tablet 0  . sertraline (ZOLOFT) 100 MG tablet Take 100 mg by mouth daily.    . traZODone (DESYREL) 100 MG tablet Take 100 mg  by mouth at bedtime.     No current facility-administered medications for this visit.      Allergies:  Allergies  Allergen Reactions  . No Known Allergies Other (See Comments)    Previously listed PCN Allergy not active.   Per Pt interview, "has taken a number of PCN derivatives" Taken Amoxicillin, Cephalosporins.    Past Medical History, Surgical history, Social history, and Family History reviewed without any changes.    Physical Exam:  Blood pressure 136/81, pulse (!) 101,  temperature 97.8 F (36.6 C), temperature source Oral, resp. rate 18, height 6\' 1"  (1.854 m), weight 217 lb 1.6 oz (98.5 kg), SpO2 100 %.    ECOG:1     General appearance: Comfortable appearing without any discomfort Head: Normocephalic without any trauma Oropharynx: Tonsillar erythema noted on the left side.  No other masses or lesions. Eyes: Pupils are equal and round reactive to light. Lymph nodes: No cervical, supraclavicular, inguinal or axillary lymphadenopathy.   Heart:regular rate and rhythm.  S1 and S2 without leg edema. Lung: Clear without any rhonchi or wheezes.  No dullness to percussion. Abdomin: Soft, nontender, nondistended with good bowel sounds.  No hepatosplenomegaly. Musculoskeletal: No joint deformity or effusion.  Full range of motion noted. Neurological: No deficits noted on motor, sensory and deep tendon reflex exam. Skin: No petechial rash or dryness.  Appeared moist.       CBC    Component Value Date/Time   WBC 9.5 03/31/2019 1440   WBC 15.4 (H) 11/26/2017 0850   RBC 5.85 (H) 03/31/2019 1440   HGB 15.4 03/31/2019 1440   HGB 16.2 08/13/2017 1236   HCT 47.6 03/31/2019 1440   HCT 50.8 (H) 08/13/2017 1236   PLT 112 (L) 03/31/2019 1440   PLT 160 08/13/2017 1236   MCV 81.4 03/31/2019 1440   MCV 89.0 08/13/2017 1236   MCH 26.3 03/31/2019 1440   MCHC 32.4 03/31/2019 1440   RDW 13.9 03/31/2019 1440   RDW 15.2 (H) 08/13/2017 1236   LYMPHSABS 4.5 (H) 03/31/2019 1440   LYMPHSABS 35.1 (H) 08/13/2017 1236   MONOABS 1.4 (H) 03/31/2019 1440   MONOABS 1.8 (H) 08/13/2017 1236   EOSABS 0.1 03/31/2019 1440   EOSABS 0.1 08/13/2017 1236   BASOSABS 0.0 03/31/2019 1440   BASOSABS 0.1 08/13/2017 1236      Chemistry      Component Value Date/Time   NA 139 08/05/2018 1142   NA 135 (L) 08/13/2017 1236   K 4.9 08/05/2018 1142   K 5.0 08/13/2017 1236   CL 104 08/05/2018 1142   CO2 29 08/05/2018 1142   CO2 22 08/13/2017 1236   BUN 19 08/05/2018 1142   BUN  28.7 (H) 08/13/2017 1236   CREATININE 1.21 08/05/2018 1142   CREATININE 1.5 (H) 08/13/2017 1236      Component Value Date/Time   CALCIUM 8.7 (L) 08/05/2018 1142   CALCIUM 8.8 08/13/2017 1236   ALKPHOS 101 08/05/2018 1142   ALKPHOS 109 08/13/2017 1236   AST 26 08/05/2018 1142   AST 21 08/13/2017 1236   ALT 28 08/05/2018 1142   ALT 21 08/13/2017 1236   BILITOT 0.8 08/05/2018 1142   BILITOT 1.44 (H) 08/13/2017 3919       68 year old man with:  1.  Stage IIIA mantle cell lymphoma diagnosed in 2014.  He was found to have lymphocytosis and splenomegaly and status post outlined therapy above.    He has been off treatment for the last 8 months and has developed  documented recurrent disease in the left tonsil.  He is laboratory testing does not indicate any abnormalities on his CBC at this time or worsening marrow involvement.  Treatment options were reviewed at this time restarting systemic therapy versus focal local radiation to the left tonsil was discussed.  After discussion the risks and benefits of all these approaches he has opted to proceed with systemic therapy after restaging work-up.  Complication associated with radiation including further dysphasia and nutritional deficiencies might be prohibitive at this time.  The plan is to restage him with a CT scan of the chest abdomen pelvis and restart ibrutinib in the near future.  Different salvage chemotherapy options were reviewed but given the effectiveness of this medication will restart it in the near future.  Potential complications were reiterated to him at this time occluding thrombosis, bleeding, cardiac toxicity arrhythmia and GI toxicities.  2.  Dysphasia and nutritional considerations: We have discussed strategies to boost his appetite as well as weight to get adequate nutrition.  3. Followup: We will be in the next 4 to 6 weeks to follow his progress.  25  minutes was spent with the patient face-to-face today.  More than 50%  of time was spent on updating his disease status, reviewing pathology reports, discussing treatment options and future plan of care.   Zola Button, MD 8/21/20203:19 PM

## 2019-04-03 ENCOUNTER — Telehealth: Payer: Self-pay

## 2019-04-03 DIAGNOSIS — Z794 Long term (current) use of insulin: Secondary | ICD-10-CM | POA: Diagnosis not present

## 2019-04-03 DIAGNOSIS — E782 Mixed hyperlipidemia: Secondary | ICD-10-CM | POA: Diagnosis not present

## 2019-04-03 DIAGNOSIS — E1142 Type 2 diabetes mellitus with diabetic polyneuropathy: Secondary | ICD-10-CM | POA: Diagnosis not present

## 2019-04-03 NOTE — Telephone Encounter (Signed)
Oral Oncology Patient Advocate Encounter  Received notification from [facility] that prior authorization for Micheal Thompson is required.  PA submitted on CoverMyMeds Key [id number] Status is pending  Oral Oncology Clinic will continue to follow.  [place] Patient Micheal Thompson Phone [phone] Fax [phone] 04/03/2019    1:06 PM

## 2019-04-04 ENCOUNTER — Telehealth: Payer: Self-pay | Admitting: Oncology

## 2019-04-04 NOTE — Telephone Encounter (Signed)
Called and spoke with patient. Confirmed 9/25 appt

## 2019-04-04 NOTE — Telephone Encounter (Signed)
Oral Oncology Patient Advocate Encounter  Prior Authorization for [name] has been approved.    PA# [id number] Effective dates: 08/08/18 through 08/10/19  Oral Oncology Clinic will continue to follow.   Micheal Thompson Patient [place] Phone [phone] Fax [phone] 04/04/2019    8:30 AM

## 2019-04-06 ENCOUNTER — Ambulatory Visit (HOSPITAL_COMMUNITY)
Admission: RE | Admit: 2019-04-06 | Discharge: 2019-04-06 | Disposition: A | Discharge status: Home / Self Care | Payer: Medicare HMO | Source: Ambulatory Visit | Attending: Oncology | Admitting: Oncology

## 2019-04-06 ENCOUNTER — Other Ambulatory Visit: Payer: Self-pay

## 2019-04-06 ENCOUNTER — Encounter (HOSPITAL_COMMUNITY): Payer: Self-pay

## 2019-04-06 DIAGNOSIS — J320 Chronic maxillary sinusitis: Secondary | ICD-10-CM | POA: Diagnosis not present

## 2019-04-06 DIAGNOSIS — N289 Disorder of kidney and ureter, unspecified: Secondary | ICD-10-CM | POA: Diagnosis not present

## 2019-04-06 DIAGNOSIS — J439 Emphysema, unspecified: Secondary | ICD-10-CM | POA: Diagnosis not present

## 2019-04-06 DIAGNOSIS — R590 Localized enlarged lymph nodes: Secondary | ICD-10-CM | POA: Diagnosis not present

## 2019-04-06 DIAGNOSIS — C8317 Mantle cell lymphoma, spleen: Secondary | ICD-10-CM | POA: Diagnosis not present

## 2019-04-06 MED ORDER — SODIUM CHLORIDE (PF) 0.9 % IJ SOLN
INTRAMUSCULAR | Status: AC
  Filled 2019-04-06: qty 50

## 2019-04-06 MED ORDER — IOHEXOL 300 MG/ML  SOLN
75.0000 mL | Freq: Once | INTRAMUSCULAR | Status: AC | PRN
  Administered 2019-04-06: 75 mL via INTRAVENOUS

## 2019-04-06 MED FILL — IMBRUVICA 560 MG TAB: 560 | 28 days supply | Qty: 28 | Fill #0

## 2019-04-06 NOTE — Telephone Encounter (Signed)
Oral Chemotherapy Pharmacist Encounter   I spoke with patient for overview of: Imbruvica (ibrutinib) for the treatment of recurrent mantle cell lymphoma, planned duration until disease progression or unacceptable toxicity.   Counseled patient on administration, dosing, side effects, monitoring, drug-food interactions, safe handling, storage, and disposal.  Patient will take Imbruvica 560mg  tablets, 1 tablet (560mg ) by mouth once daily.  Patient will take Imbruvica at approximately the same time each day with a full glass of water and maintain adequate hydration throughout the day.  Patient knows to avoid grapefruit or grapefruit juice while on therapy with Imbruvica.  Imbruvica start date: 04/07/2019  Adverse effects include but are not limited to: bruising, decreased blood counts, N/V, diarrhea, musculoskeletal pain, arthralgias, peripheral edema, and hemorrhage.   Patient will obtain anti diarrheal and alert the office of 4 or more loose stools above baseline.  Reviewed with patient importance of keeping a medication schedule and plan for any missed doses.  Medication reconciliation performed and medication/allergy list updated.  Insurance authorization for [name] has been obtained. Test claim at the pharmacy revealed copayment $0 for 1st fill of ibrutinib. This will ship from the [facility] on 04/06/19 to deliver to patient's home on 04/07/19.  Patient informed the pharmacy will reach out 5-7 days prior to needing next fill of Imbruvica to coordinate continued medication acquisition to prevent break in therapy.  All questions answered.  Micheal Thompson voiced understanding and appreciation.   Patient knows to call the office with questions or concerns.  Johny Drilling, PharmD, BCPS, BCOP  04/06/2019   9:52 AM Oral Oncology Clinic [phone]

## 2019-04-07 NOTE — Telephone Encounter (Signed)
Oral Oncology Patient Advocate Encounter  Confirmed with [facility] that [name] was shipped on 8/27 with a $0 copay.   [place] Patient [place] Phone [phone] Fax [phone] 04/07/2019   8:11 AM

## 2019-04-18 DIAGNOSIS — E1165 Type 2 diabetes mellitus with hyperglycemia: Secondary | ICD-10-CM | POA: Diagnosis not present

## 2019-04-27 ENCOUNTER — Other Ambulatory Visit: Payer: Self-pay | Admitting: Oncology

## 2019-04-27 DIAGNOSIS — H9221 Otorrhagia, right ear: Secondary | ICD-10-CM | POA: Diagnosis not present

## 2019-04-27 DIAGNOSIS — J209 Acute bronchitis, unspecified: Secondary | ICD-10-CM | POA: Diagnosis not present

## 2019-04-27 DIAGNOSIS — R05 Cough: Secondary | ICD-10-CM | POA: Diagnosis not present

## 2019-04-27 DIAGNOSIS — H6981 Other specified disorders of Eustachian tube, right ear: Secondary | ICD-10-CM | POA: Diagnosis not present

## 2019-04-27 DIAGNOSIS — H7111 Cholesteatoma of tympanum, right ear: Secondary | ICD-10-CM | POA: Diagnosis not present

## 2019-04-27 DIAGNOSIS — Z8781 Personal history of (healed) traumatic fracture: Secondary | ICD-10-CM | POA: Diagnosis not present

## 2019-04-27 DIAGNOSIS — H7291 Unspecified perforation of tympanic membrane, right ear: Secondary | ICD-10-CM | POA: Diagnosis not present

## 2019-04-27 DIAGNOSIS — C8317 Mantle cell lymphoma, spleen: Secondary | ICD-10-CM

## 2019-04-27 DIAGNOSIS — C8310 Mantle cell lymphoma, unspecified site: Secondary | ICD-10-CM | POA: Diagnosis not present

## 2019-05-04 MED FILL — IMBRUVICA 560 MG TAB: 560 | 28 days supply | Qty: 28 | Fill #0

## 2019-05-05 ENCOUNTER — Other Ambulatory Visit: Payer: Self-pay

## 2019-05-05 ENCOUNTER — Inpatient Hospital Stay: Payer: Medicare HMO | Attending: Oncology | Admitting: Oncology

## 2019-05-05 ENCOUNTER — Inpatient Hospital Stay: Payer: Medicare HMO

## 2019-05-05 VITALS — BP 108/59 | HR 63 | Temp 98.3°F | Resp 17 | Ht 73.0 in | Wt 222.4 lb

## 2019-05-05 DIAGNOSIS — C8317 Mantle cell lymphoma, spleen: Secondary | ICD-10-CM

## 2019-05-05 LAB — CBC WITH DIFFERENTIAL (CANCER CENTER ONLY)
Abs Immature Granulocytes: 0 10*3/uL (ref 0.00–0.07)
Basophils Absolute: 0 10*3/uL (ref 0.0–0.1)
Basophils Relative: 0 %
Eosinophils Absolute: 0 10*3/uL (ref 0.0–0.5)
Eosinophils Relative: 0 %
HCT: 44 % (ref 39.0–52.0)
Hemoglobin: 13.7 g/dL (ref 13.0–17.0)
Lymphocytes Relative: 71 %
Lymphs Abs: 20.4 10*3/uL — ABNORMAL HIGH (ref 0.7–4.0)
MCH: 27 pg (ref 26.0–34.0)
MCHC: 31.1 g/dL (ref 30.0–36.0)
MCV: 86.8 fL (ref 80.0–100.0)
Monocytes Absolute: 0.9 10*3/uL (ref 0.1–1.0)
Monocytes Relative: 3 %
Neutro Abs: 7.5 10*3/uL (ref 1.7–17.7)
Neutrophils Relative %: 26 %
Platelet Count: 110 10*3/uL — ABNORMAL LOW (ref 150–400)
RBC: 5.07 MIL/uL (ref 4.22–5.81)
RDW: 17.5 % — ABNORMAL HIGH (ref 11.5–15.5)
WBC Count: 28.8 10*3/uL — ABNORMAL HIGH (ref 4.0–10.5)
nRBC: 0 % (ref 0.0–0.2)

## 2019-05-05 LAB — CMP (CANCER CENTER ONLY)
ALT: 12 U/L (ref 0–44)
AST: 17 U/L (ref 15–41)
Albumin: 3.8 g/dL (ref 3.5–5.0)
Alkaline Phosphatase: 87 U/L (ref 38–126)
Anion gap: 7 (ref 5–15)
BUN: 15 mg/dL (ref 8–23)
CO2: 26 mmol/L (ref 22–32)
Calcium: 8.1 mg/dL — ABNORMAL LOW (ref 8.9–10.3)
Chloride: 108 mmol/L (ref 98–111)
Creatinine: 0.96 mg/dL (ref 0.61–1.24)
GFR, Est AFR Am: >60 mL/min (ref >60)
GFR, Estimated: >60 mL/min (ref >60)
Glucose, Bld: 176 mg/dL — ABNORMAL HIGH (ref 70–99)
Potassium: 4.2 mmol/L (ref 3.5–5.1)
Sodium: 141 mmol/L (ref 135–145)
Total Bilirubin: 0.6 mg/dL (ref 0.3–1.2)
Total Protein: 5.9 g/dL — ABNORMAL LOW (ref 6.5–8.1)

## 2019-05-05 NOTE — Progress Notes (Signed)
Hematology and Oncology Follow Up Visit  Micheal Thompson [id number] 06-05-1951 68 y.o. 05/05/2019 12:57 PM   Principle Diagnosis: 68 year old man with stage IIIa mantle cell lymphoma presented with a leukocytosis and lymphadenopathy in 2014. .  Prior therapy:   Chemotherapy with  bendamustine and rituximab started on 02/28/2013. He is S/P 5 cycles completed in 07/2013. He achieved complete response at that time.  He developed relapsed disease in August 2018.  Ibrutinib 560 mg daily started in 03/2017.  Therapy withheld in December 2019 based on his request.  Current therapy: Ibrutinib 560 mg restarted in August 2020.   Interim History: Micheal Thompson is here for return evaluation.  Since the last visit, he reports starting ibrutinib without any major complications.  He has tolerated it well without any new side effects.  He denies any bruising or bleeding complications.  He denies any chest pain or palpitation.  He is swallowing and eating has improved gained close to 5 pounds.  Denies any dysphagia or odontophagia.  His sore throat has resolved at this time.  He denied headaches, blurry vision, syncope or seizures.  Denies any fevers, chills or sweats.  Denied chest pain, palpitation, orthopnea or leg edema.  Denied cough, wheezing or hemoptysis.  Denied nausea, vomiting or abdominal pain.  Denies any constipation or diarrhea.  Denies any frequency urgency or hesitancy.  Denies any arthralgias or myalgias.  Denies any skin rashes or lesions.  Denies any bleeding or clotting tendency.  Denies any easy bruising.  Denies any hair or nail changes.  Denies any anxiety or depression.  Remaining review of system is negative.        Medications: Without any changes on review. Current Outpatient Medications  Medication Sig Dispense Refill  . albuterol (PROVENTIL HFA;VENTOLIN HFA) 108 (90 Base) MCG/ACT inhaler Inhale into the lungs.    Marland Kitchen atorvastatin (LIPITOR) 80 MG tablet Take 80 mg by mouth every  evening.     . B Complex-C (B-COMPLEX WITH VITAMIN C) tablet Take 1 tablet by mouth daily.    . ferrous sulfate 325 (65 FE) MG EC tablet Take 325 mg by mouth daily with breakfast.    . IMBRUVICA 560 MG TABS TAKE 1 TABLET BY MOUTH ONCE DAILY 28 tablet 0  . insulin regular human CONCENTRATED (HUMULIN R) 500 UNIT/ML SOLN injection Inject 6-16 Units into the skin 3 (three) times daily with meals. 12/14:  Pt with vial & drawing up in a U-100 syringe:  6 units (= 30 units)  in the morning, 16 units (= 80 units) lunch, 10 units (= 50 units) at night. May adjust the 10 unit (50 unit) night dose using sliding scale as needed.    Marland Kitchen JARDIANCE 25 MG TABS tablet Take 25 mg by mouth at bedtime.     Marland Kitchen lisinopril (PRINIVIL,ZESTRIL) 10 MG tablet Take 10 mg by mouth at bedtime.     . Multiple Vitamins-Minerals (CENTRUM PO) Take by mouth daily.    Marland Kitchen oxyCODONE (OXY IR/ROXICODONE) 5 MG immediate release tablet Take 1-2 tablets (5-10 mg total) by mouth every 4 (four) hours as needed for moderate pain. 40 tablet 0  . sertraline (ZOLOFT) 100 MG tablet Take 100 mg by mouth daily.    . traZODone (DESYREL) 100 MG tablet Take 100 mg by mouth at bedtime.     No current facility-administered medications for this visit.      Allergies:  Allergies  Allergen Reactions  . No Known Allergies Other (See Comments)  Previously listed PCN Allergy not active.   Per Pt interview, "has taken a number of PCN derivatives" Taken Amoxicillin, Cephalosporins.    Past Medical History, Surgical history, Social history, and Family History updated without any changes.    Physical Exam:  Blood pressure (!) 108/59, pulse 63, temperature 98.3 F (36.8 C), temperature source Oral, resp. rate 17, height 6\' 1"  (1.854 m), weight 222 lb 6.4 oz (100.9 kg), SpO2 99 %.     ECOG:1     General appearance: Alert, awake without any distress. Head: Atraumatic without abnormalities Oropharynx: Slight erythema noted on the left tonsil.  No  masses or lesions. Eyes: No scleral icterus. Lymph nodes: No lymphadenopathy noted in the cervical, supraclavicular, or axillary nodes Heart:regular rate and rhythm, without any murmurs or gallops.   Lung: Clear to auscultation without any rhonchi, wheezes or dullness to percussion. Abdomin: Soft, nontender without any shifting dullness or ascites. Musculoskeletal: No clubbing or cyanosis. Neurological: No motor or sensory deficits. Skin: No rashes or lesions.      CBC    Component Value Date/Time   WBC 9.5 03/31/2019 1440   WBC 15.4 (H) 11/26/2017 0850   RBC 5.85 (H) 03/31/2019 1440   HGB 15.4 03/31/2019 1440   HGB 16.2 08/13/2017 1236   HCT 47.6 03/31/2019 1440   HCT 50.8 (H) 08/13/2017 1236   PLT 112 (L) 03/31/2019 1440   PLT 160 08/13/2017 1236   MCV 81.4 03/31/2019 1440   MCV 89.0 08/13/2017 1236   MCH 26.3 03/31/2019 1440   MCHC 32.4 03/31/2019 1440   RDW 13.9 03/31/2019 1440   RDW 15.2 (H) 08/13/2017 1236   LYMPHSABS 4.5 (H) 03/31/2019 1440   LYMPHSABS 35.1 (H) 08/13/2017 1236   MONOABS 1.4 (H) 03/31/2019 1440   MONOABS 1.8 (H) 08/13/2017 1236   EOSABS 0.1 03/31/2019 1440   EOSABS 0.1 08/13/2017 1236   BASOSABS 0.0 03/31/2019 1440   BASOSABS 0.1 08/13/2017 1236      Chemistry      Component Value Date/Time   NA 136 03/31/2019 1440   NA 135 (L) 08/13/2017 1236   K 5.2 (H) 03/31/2019 1440   K 5.0 08/13/2017 1236   CL 100 03/31/2019 1440   CO2 22 03/31/2019 1440   CO2 22 08/13/2017 1236   BUN 35 (H) 03/31/2019 1440   BUN 28.7 (H) 08/13/2017 1236   CREATININE 1.77 (H) 03/31/2019 1440   CREATININE 1.5 (H) 08/13/2017 1236      Component Value Date/Time   CALCIUM 9.0 03/31/2019 1440   CALCIUM 8.8 08/13/2017 1236   ALKPHOS 181 (H) 03/31/2019 1440   ALKPHOS 109 08/13/2017 1236   AST 20 03/31/2019 1440   AST 21 08/13/2017 1236   ALT 12 03/31/2019 1440   ALT 21 08/13/2017 1236   BILITOT 0.7 03/31/2019 1440   BILITOT 1.44 (H) 08/13/2017 7145        68 year old man with:  1.  Mantle cell lymphoma diagnosed in 2014.  He was found to have lymphocytosis and splenomegaly in addition to lymphadenopathy.  His initial stage was stage IIIA.    He is currently on ibrutinib that was restarted in August 2020 due to relapsed disease.  The natural course of this disease as well as alternative treatment options were reviewed today.  He is experiencing excellent clinical benefit with decrease in his tonsillar involvement of lymphoma.  He is eating better and has gained weight.  He does have more leukocytosis which is expected with ibrutinib.  At this  time, we have elected to continue with the same dose and schedule of ibrutinib and will continue to follow him closely.  2.  Dysphasia and nutritional considerations: Improved after the start of his lymphoma treatment.  His weight is up.  3. Followup: Will be in 4 to 6 weeks for repeat evaluation.  25  minutes was spent with the patient face-to-face today.  More than 50% of time was dedicated to discussing the natural course of this disease as well as treatment options and addressing complications related to his current therapy.   Zola Button, MD 9/25/202012:57 PM

## 2019-05-08 ENCOUNTER — Telehealth: Payer: Self-pay | Admitting: Oncology

## 2019-05-08 NOTE — Telephone Encounter (Signed)
Scheduled appt per 9/25 los.  Sent a message and a calendar will be mailed out.

## 2019-05-18 DIAGNOSIS — E1165 Type 2 diabetes mellitus with hyperglycemia: Secondary | ICD-10-CM | POA: Diagnosis not present

## 2019-06-01 MED FILL — IMBRUVICA 560 MG TAB: 560 | 28 days supply | Qty: 28 | Fill #0

## 2019-06-09 DIAGNOSIS — R69 Illness, unspecified: Secondary | ICD-10-CM | POA: Diagnosis not present

## 2019-06-12 DIAGNOSIS — E1165 Type 2 diabetes mellitus with hyperglycemia: Secondary | ICD-10-CM | POA: Diagnosis not present

## 2019-06-15 ENCOUNTER — Inpatient Hospital Stay: Payer: Medicare HMO | Attending: Oncology

## 2019-06-15 ENCOUNTER — Inpatient Hospital Stay (HOSPITAL_BASED_OUTPATIENT_CLINIC_OR_DEPARTMENT_OTHER): Payer: Medicare HMO | Admitting: Oncology

## 2019-06-15 ENCOUNTER — Telehealth: Payer: Self-pay | Admitting: Oncology

## 2019-06-15 ENCOUNTER — Other Ambulatory Visit: Payer: Self-pay

## 2019-06-15 VITALS — BP 125/60 | HR 66 | Temp 98.7°F | Resp 17 | Ht 73.0 in | Wt 225.4 lb

## 2019-06-15 DIAGNOSIS — C8317 Mantle cell lymphoma, spleen: Secondary | ICD-10-CM | POA: Insufficient documentation

## 2019-06-15 LAB — CBC WITH DIFFERENTIAL (CANCER CENTER ONLY)
Abs Immature Granulocytes: 0.13 10*3/uL — ABNORMAL HIGH (ref 0.00–0.07)
Basophils Absolute: 0.1 10*3/uL (ref 0.0–0.1)
Basophils Relative: 0 %
Eosinophils Absolute: 0.2 10*3/uL (ref 0.0–0.5)
Eosinophils Relative: 0 %
HCT: 45.5 % (ref 39.0–52.0)
Hemoglobin: 14.1 g/dL (ref 13.0–17.0)
Immature Granulocytes: 0 %
Lymphocytes Relative: 81 %
Lymphs Abs: 37.1 10*3/uL — ABNORMAL HIGH (ref 0.7–4.0)
MCH: 27.2 pg (ref 26.0–34.0)
MCHC: 31 g/dL (ref 30.0–36.0)
MCV: 87.7 fL (ref 80.0–100.0)
Monocytes Absolute: 3.1 10*3/uL — ABNORMAL HIGH (ref 0.1–1.0)
Monocytes Relative: 7 %
Neutro Abs: 5.5 10*3/uL (ref 1.7–7.7)
Neutrophils Relative %: 12 %
Platelet Count: 136 10*3/uL — ABNORMAL LOW (ref 150–400)
RBC: 5.19 MIL/uL (ref 4.22–5.81)
RDW: 18.6 % — ABNORMAL HIGH (ref 11.5–15.5)
WBC Count: 46.1 10*3/uL — ABNORMAL HIGH (ref 4.0–10.5)
nRBC: 0 % (ref 0.0–0.2)

## 2019-06-15 LAB — CMP (CANCER CENTER ONLY)
ALT: 14 U/L (ref 0–44)
AST: 16 U/L (ref 15–41)
Albumin: 4 g/dL (ref 3.5–5.0)
Alkaline Phosphatase: 107 U/L (ref 38–126)
Anion gap: 10 (ref 5–15)
BUN: 22 mg/dL (ref 8–23)
CO2: 24 mmol/L (ref 22–32)
Calcium: 8.4 mg/dL — ABNORMAL LOW (ref 8.9–10.3)
Chloride: 106 mmol/L (ref 98–111)
Creatinine: 1.23 mg/dL (ref 0.61–1.24)
GFR, Est AFR Am: >60 mL/min (ref >60)
GFR, Estimated: >60 mL/min (ref >60)
Glucose, Bld: 176 mg/dL — ABNORMAL HIGH (ref 70–99)
Potassium: 4.8 mmol/L (ref 3.5–5.1)
Sodium: 140 mmol/L (ref 135–145)
Total Bilirubin: 0.6 mg/dL (ref 0.3–1.2)
Total Protein: 6.4 g/dL — ABNORMAL LOW (ref 6.5–8.1)

## 2019-06-15 NOTE — Progress Notes (Signed)
Hematology and Oncology Follow Up Visit  Micheal Thompson [id number] [birth year] 68 y.o. 06/15/2019 10:10 AM   Principle Diagnosis: 68 year old man with mantle cell lymphoma diagnosed in 2014.  Presented with stage IIIA disease and splenomegaly.  Prior therapy:    Chemotherapy with  bendamustine and rituximab started on 02/28/2013. He is S/P 5 cycles completed in 07/2013. He achieved complete response at that time.  He developed relapsed disease in August 2018.  Ibrutinib 560 mg daily started in 03/2017.  Therapy withheld in December 2019 based on his request.  Current therapy: Ibrutinib 560 mg restarted in August 2020.   Interim History: Micheal Thompson presents today for a follow-up visit.  Since the last visit, he reports no major changes in his health.  He continues to tolerate ibrutinib without any complaints.  He does report some bruising on his upper extremities without any excessive bleeding.  He denies any epistaxis, hematochezia or melena.  Denies any palpitation or chest pain.  His performance status and quality of life remains unchanged.  He is tonsillar enlargement has improved significantly and is able to eat properly at this time.  His performance status is excellent quality of life is unchanged.  Patient denied any alteration mental status, neuropathy, confusion or dizziness.  Denies any headaches or lethargy.  Denies any night sweats, weight loss or changes in appetite.  Denied orthopnea, dyspnea on exertion or chest discomfort.  Denies shortness of breath, difficulty breathing hemoptysis or cough.  Denies any abdominal distention, nausea, early satiety or dyspepsia.  Denies any hematuria, frequency, dysuria or nocturia.  Denies any skin irritation, dryness or rash.  Denies any ecchymosis or petechiae.  Denies any lymphadenopathy or clotting.  Denies any heat or cold intolerance.  Denies any anxiety or depression.  Remaining review of system is negative.           Medications:  Without any changes on review. Current Outpatient Medications  Medication Sig Dispense Refill  . albuterol (PROVENTIL HFA;VENTOLIN HFA) 108 (90 Base) MCG/ACT inhaler Inhale into the lungs.    Marland Kitchen atorvastatin (LIPITOR) 80 MG tablet Take 80 mg by mouth every evening.     . B Complex-C (B-COMPLEX WITH VITAMIN C) tablet Take 1 tablet by mouth daily.    . ferrous sulfate 325 (65 FE) MG EC tablet Take 325 mg by mouth daily with breakfast.    . IMBRUVICA 560 MG TABS TAKE 1 TABLET BY MOUTH ONCE DAILY 28 tablet 0  . insulin regular human CONCENTRATED (HUMULIN R) 500 UNIT/ML SOLN injection Inject 6-16 Units into the skin 3 (three) times daily with meals. 12/14:  Pt with vial & drawing up in a U-100 syringe:  6 units (= 30 units)  in the morning, 16 units (= 80 units) lunch, 10 units (= 50 units) at night. May adjust the 10 unit (50 unit) night dose using sliding scale as needed.    Marland Kitchen JARDIANCE 25 MG TABS tablet Take 25 mg by mouth at bedtime.     Marland Kitchen lisinopril (PRINIVIL,ZESTRIL) 10 MG tablet Take 10 mg by mouth at bedtime.     . Multiple Vitamins-Minerals (CENTRUM PO) Take by mouth daily.    Marland Kitchen oxyCODONE (OXY IR/ROXICODONE) 5 MG immediate release tablet Take 1-2 tablets (5-10 mg total) by mouth every 4 (four) hours as needed for moderate pain. 40 tablet 0  . sertraline (ZOLOFT) 100 MG tablet Take 100 mg by mouth daily.    . traZODone (DESYREL) 100 MG tablet Take 100 mg by mouth  at bedtime.     No current facility-administered medications for this visit.      Allergies:  Allergies  Allergen Reactions  . No Known Allergies Other (See Comments)    Previously listed PCN Allergy not active.   Per Pt interview, "has taken a number of PCN derivatives" Taken Amoxicillin, Cephalosporins.    Past Medical History, Surgical history, Social history, and Family History unchanged on review.    Physical Exam:   Blood pressure 125/60, pulse 66, temperature 98.7 F (37.1 C), temperature source Temporal, resp.  rate 17, height 6\' 1"  (1.854 m), weight 225 lb 6.4 oz (102.2 kg), SpO2 99 %.     ECOG:1    General appearance: Comfortable appearing without any discomfort Head: Normocephalic without any trauma Oropharynx: Mucous membranes are moist and pink without any thrush or ulcers. Eyes: Pupils are equal and round reactive to light. Lymph nodes: No cervical, supraclavicular, inguinal or axillary lymphadenopathy.   Heart:regular rate and rhythm.  S1 and S2 without leg edema. Lung: Clear without any rhonchi or wheezes.  No dullness to percussion. Abdomin: Soft, nontender, nondistended with good bowel sounds.  No hepatosplenomegaly. Musculoskeletal: No joint deformity or effusion.  Full range of motion noted. Neurological: No deficits noted on motor, sensory and deep tendon reflex exam. Skin: Erythema and excoriations noted on his upper arms.  No petechiae.       CBC    Component Value Date/Time   WBC 28.8 (H) 05/05/2019 1247   WBC 15.4 (H) 11/26/2017 0850   RBC 5.07 05/05/2019 1247   HGB 13.7 05/05/2019 1247   HGB 16.2 08/13/2017 1236   HCT 44.0 05/05/2019 1247   HCT 50.8 (H) 08/13/2017 1236   PLT 110 (L) 05/05/2019 1247   PLT 160 08/13/2017 1236   MCV 86.8 05/05/2019 1247   MCV 89.0 08/13/2017 1236   MCH 27.0 05/05/2019 1247   MCHC 31.1 05/05/2019 1247   RDW 17.5 (H) 05/05/2019 1247   RDW 15.2 (H) 08/13/2017 1236   LYMPHSABS 20.4 (H) 05/05/2019 1247   LYMPHSABS 35.1 (H) 08/13/2017 1236   MONOABS 0.9 05/05/2019 1247   MONOABS 1.8 (H) 08/13/2017 1236   EOSABS 0.0 05/05/2019 1247   EOSABS 0.1 08/13/2017 1236   BASOSABS 0.0 05/05/2019 1247   BASOSABS 0.1 08/13/2017 1236      Chemistry      Component Value Date/Time   NA 141 05/05/2019 1247   NA 135 (L) 08/13/2017 1236   K 4.2 05/05/2019 1247   K 5.0 08/13/2017 1236   CL 108 05/05/2019 1247   CO2 26 05/05/2019 1247   CO2 22 08/13/2017 1236   BUN 15 05/05/2019 1247   BUN 28.7 (H) 08/13/2017 1236   CREATININE 0.96  05/05/2019 1247   CREATININE 1.5 (H) 08/13/2017 1236      Component Value Date/Time   CALCIUM 8.1 (L) 05/05/2019 1247   CALCIUM 8.8 08/13/2017 1236   ALKPHOS 87 05/05/2019 1247   ALKPHOS 109 08/13/2017 1236   AST 17 05/05/2019 1247   AST 21 08/13/2017 1236   ALT 12 05/05/2019 1247   ALT 21 08/13/2017 1236   BILITOT 0.6 05/05/2019 1247   BILITOT 1.44 (H) 08/13/2017 1168       68 year old man with:  1.  Mantle cell lymphoma presented with lymphocytosis and splenomegaly in 2014.      He has tolerated ibrutinib without any major complications and excellent clinical response and his oral cavity involvement and tonsillar enlargement.  Risks and benefits of continuing  this therapy was discussed.  Alternative treatment options were also reviewed at this time.  After discussion today including risk of bleeding cardiac complication associated with this medication, he opted to continue and agreeable to do so.  He is experiencing more leukocytosis which would be likely a reaction to ibrutinib and will continue to monitor.  2.  Dysphasia and nutritional considerations: Resolved at this time and his weight is improving.  3. Followup: In 6 weeks for repeat evaluation.  25  minutes was spent with the patient face-to-face today.  More than 50% of time was dedicated to discussing the natural course of this disease as well as treatment options and addressing complications related to his current therapy.   Zola Button, MD 11/5/202010:10 AM

## 2019-06-15 NOTE — Telephone Encounter (Signed)
Scheduled appt per 11/5 los.  Spoke with pt and they are aware of the appt date and time.

## 2019-06-26 ENCOUNTER — Other Ambulatory Visit: Payer: Self-pay | Admitting: Oncology

## 2019-06-26 DIAGNOSIS — C8317 Mantle cell lymphoma, spleen: Secondary | ICD-10-CM

## 2019-06-27 DIAGNOSIS — Z794 Long term (current) use of insulin: Secondary | ICD-10-CM | POA: Diagnosis not present

## 2019-06-27 DIAGNOSIS — E1165 Type 2 diabetes mellitus with hyperglycemia: Secondary | ICD-10-CM | POA: Diagnosis not present

## 2019-06-27 DIAGNOSIS — R21 Rash and other nonspecific skin eruption: Secondary | ICD-10-CM | POA: Diagnosis not present

## 2019-06-27 DIAGNOSIS — L089 Local infection of the skin and subcutaneous tissue, unspecified: Secondary | ICD-10-CM | POA: Diagnosis not present

## 2019-06-27 DIAGNOSIS — F418 Other specified anxiety disorders: Secondary | ICD-10-CM | POA: Diagnosis not present

## 2019-06-27 DIAGNOSIS — C8310 Mantle cell lymphoma, unspecified site: Secondary | ICD-10-CM | POA: Diagnosis not present

## 2019-06-27 DIAGNOSIS — E1142 Type 2 diabetes mellitus with diabetic polyneuropathy: Secondary | ICD-10-CM | POA: Diagnosis not present

## 2019-06-27 DIAGNOSIS — F5105 Insomnia due to other mental disorder: Secondary | ICD-10-CM | POA: Diagnosis not present

## 2019-06-27 DIAGNOSIS — R69 Illness, unspecified: Secondary | ICD-10-CM | POA: Diagnosis not present

## 2019-06-27 MED FILL — IMBRUVICA 560 MG TAB: 560 | 28 days supply | Qty: 28 | Fill #0

## 2019-07-11 DIAGNOSIS — E782 Mixed hyperlipidemia: Secondary | ICD-10-CM | POA: Diagnosis not present

## 2019-07-11 DIAGNOSIS — E1142 Type 2 diabetes mellitus with diabetic polyneuropathy: Secondary | ICD-10-CM | POA: Diagnosis not present

## 2019-07-11 DIAGNOSIS — Z794 Long term (current) use of insulin: Secondary | ICD-10-CM | POA: Diagnosis not present

## 2019-07-18 ENCOUNTER — Other Ambulatory Visit: Payer: Self-pay | Admitting: Oncology

## 2019-07-18 DIAGNOSIS — C8317 Mantle cell lymphoma, spleen: Secondary | ICD-10-CM

## 2019-07-25 MED FILL — IMBRUVICA 560 MG TAB: 560 | 28 days supply | Qty: 28 | Fill #0

## 2019-07-27 ENCOUNTER — Inpatient Hospital Stay: Payer: Medicare HMO

## 2019-07-27 ENCOUNTER — Telehealth: Payer: Self-pay | Admitting: Oncology

## 2019-07-27 ENCOUNTER — Inpatient Hospital Stay: Payer: Medicare HMO | Admitting: Oncology

## 2019-07-27 NOTE — Telephone Encounter (Signed)
Returned patient's phone call regarding rescheduling 12/17 appointments, per patient's request appointments have moved to 12/23.

## 2019-08-02 ENCOUNTER — Other Ambulatory Visit: Payer: Self-pay

## 2019-08-02 ENCOUNTER — Inpatient Hospital Stay (HOSPITAL_BASED_OUTPATIENT_CLINIC_OR_DEPARTMENT_OTHER): Payer: Medicare HMO | Admitting: Oncology

## 2019-08-02 ENCOUNTER — Telehealth: Payer: Self-pay | Admitting: Oncology

## 2019-08-02 ENCOUNTER — Inpatient Hospital Stay: Payer: Medicare HMO | Attending: Oncology

## 2019-08-02 ENCOUNTER — Telehealth: Payer: Self-pay

## 2019-08-02 VITALS — BP 128/53 | HR 61 | Temp 98.0°F | Resp 18 | Wt 220.4 lb

## 2019-08-02 DIAGNOSIS — R1310 Dysphagia, unspecified: Secondary | ICD-10-CM | POA: Diagnosis not present

## 2019-08-02 DIAGNOSIS — C8317 Mantle cell lymphoma, spleen: Secondary | ICD-10-CM | POA: Diagnosis not present

## 2019-08-02 DIAGNOSIS — D72829 Elevated white blood cell count, unspecified: Secondary | ICD-10-CM | POA: Diagnosis not present

## 2019-08-02 DIAGNOSIS — R161 Splenomegaly, not elsewhere classified: Secondary | ICD-10-CM | POA: Insufficient documentation

## 2019-08-02 LAB — CBC WITH DIFFERENTIAL (CANCER CENTER ONLY)
Abs Immature Granulocytes: 0 10*3/uL (ref 0.00–0.07)
Basophils Absolute: 0 10*3/uL (ref 0.0–0.1)
Basophils Relative: 0 %
Eosinophils Absolute: 0.6 10*3/uL — ABNORMAL HIGH (ref 0.0–0.5)
Eosinophils Relative: 2 %
HCT: 47.5 % (ref 39.0–52.0)
Hemoglobin: 14.8 g/dL (ref 13.0–17.0)
Lymphocytes Relative: 69 %
Lymphs Abs: 21 10*3/uL — ABNORMAL HIGH (ref 0.7–4.0)
MCH: 28 pg (ref 26.0–34.0)
MCHC: 31.2 g/dL (ref 30.0–36.0)
MCV: 90 fL (ref 80.0–100.0)
Monocytes Absolute: 1.8 10*3/uL — ABNORMAL HIGH (ref 0.1–1.0)
Monocytes Relative: 6 %
Neutro Abs: 7 10*3/uL (ref 1.7–7.7)
Neutrophils Relative %: 23 %
Platelet Count: 136 10*3/uL — ABNORMAL LOW (ref 150–400)
RBC: 5.28 MIL/uL (ref 4.22–5.81)
RDW: 15.8 % — ABNORMAL HIGH (ref 11.5–15.5)
WBC Count: 30.4 10*3/uL — ABNORMAL HIGH (ref 4.0–10.5)
nRBC: 0 % (ref 0.0–0.2)

## 2019-08-02 LAB — CMP (CANCER CENTER ONLY)
ALT: 11 U/L (ref 0–44)
AST: 15 U/L (ref 15–41)
Albumin: 3.8 g/dL (ref 3.5–5.0)
Alkaline Phosphatase: 114 U/L (ref 38–126)
Anion gap: 11 (ref 5–15)
BUN: 22 mg/dL (ref 8–23)
CO2: 23 mmol/L (ref 22–32)
Calcium: 8.6 mg/dL — ABNORMAL LOW (ref 8.9–10.3)
Chloride: 107 mmol/L (ref 98–111)
Creatinine: 1.29 mg/dL — ABNORMAL HIGH (ref 0.61–1.24)
GFR, Est AFR Am: >60 mL/min (ref >60)
GFR, Estimated: 57 mL/min — ABNORMAL LOW (ref >60)
Glucose, Bld: 231 mg/dL — ABNORMAL HIGH (ref 70–99)
Potassium: 6.1 mmol/L (ref 3.5–5.1)
Sodium: 141 mmol/L (ref 135–145)
Total Bilirubin: 0.6 mg/dL (ref 0.3–1.2)
Total Protein: 6.7 g/dL (ref 6.5–8.1)

## 2019-08-02 NOTE — Progress Notes (Signed)
Hematology and Oncology Follow Up Visit  MYCA LAOS [id number] September 08, 1950 68 y.o. 08/02/2019 8:38 AM   Principle Diagnosis: 68 year old man with stage IIIa mantle cell lymphoma diagnosed in 2014.  He was found to have a leukocytosis and splenomegaly.  Prior therapy:    Chemotherapy with  bendamustine and rituximab started on 02/28/2013. He is S/P 5 cycles completed in 07/2013. He achieved complete response at that time.  He developed relapsed disease in August 2018.  Ibrutinib 560 mg daily started in 03/2017.  Therapy withheld in December 2019 based on his request.  Current therapy: Ibrutinib 560 mg restarted in August 2020.   Interim History: Mr. Micheal Thompson is here for a return evaluation.  Since the last visit, he reports no major changes in his health.  Continues to tolerate ibrutinib without any new complaints.  He denies any nausea, fatigue or abdominal pain.  He denies any epistaxis, hematochezia or melena.  He denies any palpitation.  He continues to have occasional bruising some of the spontaneous some of it after minor trauma.  He denies any lymphadenopathy or petechiae.   He denied headaches, blurry vision, syncope or seizures.  Denies any fevers, chills or sweats.  Denied chest pain, palpitation, orthopnea or leg edema.  Denied cough, wheezing or hemoptysis.  Denied nausea, vomiting or abdominal pain.  Denies any constipation or diarrhea.  Denies any frequency urgency or hesitancy.  Denies any arthralgias or myalgias.  Denies any skin rashes or lesions.  Denies any bleeding or clotting tendency.  Denies any easy bruising.  Denies any hair or nail changes.  Denies any anxiety or depression.  Remaining review of system is negative.            Medications: Unchanged on review. Current Outpatient Medications  Medication Sig Dispense Refill  . albuterol (PROVENTIL HFA;VENTOLIN HFA) 108 (90 Base) MCG/ACT inhaler Inhale into the lungs.    Marland Kitchen atorvastatin (LIPITOR) 80 MG tablet  Take 80 mg by mouth every evening.     . B Complex-C (B-COMPLEX WITH VITAMIN C) tablet Take 1 tablet by mouth daily.    . ferrous sulfate 325 (65 FE) MG EC tablet Take 325 mg by mouth daily with breakfast.    . IMBRUVICA 560 MG TABS TAKE 1 TABLET (560MG ) BY MOUTH DAILY 28 tablet 0  . insulin regular human CONCENTRATED (HUMULIN R) 500 UNIT/ML SOLN injection Inject 6-16 Units into the skin 3 (three) times daily with meals. 12/14:  Pt with vial & drawing up in a U-100 syringe:  6 units (= 30 units)  in the morning, 16 units (= 80 units) lunch, 10 units (= 50 units) at night. May adjust the 10 unit (50 unit) night dose using sliding scale as needed.    Marland Kitchen JARDIANCE 25 MG TABS tablet Take 25 mg by mouth at bedtime.     Marland Kitchen lisinopril (PRINIVIL,ZESTRIL) 10 MG tablet Take 10 mg by mouth at bedtime.     . Multiple Vitamins-Minerals (CENTRUM PO) Take by mouth daily.    Marland Kitchen oxyCODONE (OXY IR/ROXICODONE) 5 MG immediate release tablet Take 1-2 tablets (5-10 mg total) by mouth every 4 (four) hours as needed for moderate pain. 40 tablet 0  . sertraline (ZOLOFT) 100 MG tablet Take 100 mg by mouth daily.    . traZODone (DESYREL) 100 MG tablet Take 100 mg by mouth at bedtime.     No current facility-administered medications for this visit.     Allergies:  Allergies  Allergen Reactions  . No  Known Allergies Other (See Comments)    Previously listed PCN Allergy not active.   Per Pt interview, "has taken a number of PCN derivatives" Taken Amoxicillin, Cephalosporins.    Past Medical History, Surgical history, Social history, and Family History updated without any changes.    Physical Exam:    Blood pressure (!) 128/53, pulse 61, temperature 98 F (36.7 C), temperature source Temporal, resp. rate 18, weight 220 lb 6.4 oz (100 kg), SpO2 99 %.     ECOG:1    General appearance: Alert, awake without any distress. Head: Atraumatic without abnormalities Oropharynx: Without any thrush or ulcers. Eyes: No  scleral icterus. Lymph nodes: No lymphadenopathy noted in the cervical, supraclavicular, or axillary nodes Heart:regular rate and rhythm, without any murmurs or gallops.   Lung: Clear to auscultation without any rhonchi, wheezes or dullness to percussion. Abdomin: Soft, nontender without any shifting dullness or ascites. Musculoskeletal: No clubbing or cyanosis. Neurological: No motor or sensory deficits. Skin: No rashes or lesions. Psychiatric: Mood and affect appeared normal.          CBC    Component Value Date/Time   WBC 46.1 (H) 06/15/2019 1001   WBC 15.4 (H) 11/26/2017 0850   RBC 5.19 06/15/2019 1001   HGB 14.1 06/15/2019 1001   HGB 16.2 08/13/2017 1236   HCT 45.5 06/15/2019 1001   HCT 50.8 (H) 08/13/2017 1236   PLT 136 (L) 06/15/2019 1001   PLT 160 08/13/2017 1236   MCV 87.7 06/15/2019 1001   MCV 89.0 08/13/2017 1236   MCH 27.2 06/15/2019 1001   MCHC 31.0 06/15/2019 1001   RDW 18.6 (H) 06/15/2019 1001   RDW 15.2 (H) 08/13/2017 1236   LYMPHSABS 37.1 (H) 06/15/2019 1001   LYMPHSABS 35.1 (H) 08/13/2017 1236   MONOABS 3.1 (H) 06/15/2019 1001   MONOABS 1.8 (H) 08/13/2017 1236   EOSABS 0.2 06/15/2019 1001   EOSABS 0.1 08/13/2017 1236   BASOSABS 0.1 06/15/2019 1001   BASOSABS 0.1 08/13/2017 1236      Chemistry      Component Value Date/Time   NA 140 06/15/2019 1001   NA 135 (L) 08/13/2017 1236   K 4.8 06/15/2019 1001   K 5.0 08/13/2017 1236   CL 106 06/15/2019 1001   CO2 24 06/15/2019 1001   CO2 22 08/13/2017 1236   BUN 22 06/15/2019 1001   BUN 28.7 (H) 08/13/2017 1236   CREATININE 1.23 06/15/2019 1001   CREATININE 1.5 (H) 08/13/2017 1236      Component Value Date/Time   CALCIUM 8.4 (L) 06/15/2019 1001   CALCIUM 8.8 08/13/2017 1236   ALKPHOS 107 06/15/2019 1001   ALKPHOS 109 08/13/2017 1236   AST 16 06/15/2019 1001   AST 21 08/13/2017 1236   ALT 14 06/15/2019 1001   ALT 21 08/13/2017 1236   BILITOT 0.6 06/15/2019 1001   BILITOT 1.44 (H) 08/13/2017  4751       68 year old man with:  1.  Stage IIIa Mantle cell lymphoma diagnosed in 2014.  He was found to have leukocytosis and splenomegaly.      He remains on ibrutinib without any new complications.  The natural course of this disease and alternative treatment options were reiterated.  Different oral targeted therapy versus systemic chemotherapy versus active surveillance all option were reviewed today.  Potential complications to continue ibrutinib were reviewed.  These include bleeding, cardiac toxicity as well as GI issues.  Easy bruising among others were also a possibility.  After discussion today he is  agreeable to continue to complete at least 6 months of therapy.  Is leukocytosis is improving with therapy without any palpable adenopathy.   2.  Dysphasia: Related to tonsillar enlargement and lymphoma involvement.  This is improved since the treatment.  3. Followup: In 6 to 8 weeks for repeat evaluation.  25  minutes was spent with the patient face-to-face today.  More than 50% of time was spent on reviewing his disease status, treatment options and addressing complications related to therapy.  Zola Button, MD 12/23/20208:38 AM

## 2019-08-02 NOTE — Telephone Encounter (Signed)
Called patient and let him know that per Dr. [name] his potassium is elevated and labs will needs to be repeated in one week. Patient agreed to come for lab work on Wednesday 08/09/19 at 11:00 for a BMET. Scheduling message sent.

## 2019-08-02 NOTE — Telephone Encounter (Signed)
Received a call from [facility] in the lab reporting patient's potassium level is 6.1. Dr. [name] made aware and no further instructions received.

## 2019-08-02 NOTE — Telephone Encounter (Signed)
Scheduled appt per 12/23 los.  Sent a message to HIM pool to get a calendar mailed out. 

## 2019-08-09 ENCOUNTER — Inpatient Hospital Stay: Payer: Medicare HMO

## 2019-08-09 ENCOUNTER — Other Ambulatory Visit: Payer: Self-pay

## 2019-08-09 ENCOUNTER — Telehealth: Payer: Self-pay

## 2019-08-09 DIAGNOSIS — C8317 Mantle cell lymphoma, spleen: Secondary | ICD-10-CM

## 2019-08-09 DIAGNOSIS — R161 Splenomegaly, not elsewhere classified: Secondary | ICD-10-CM | POA: Diagnosis not present

## 2019-08-09 DIAGNOSIS — D72829 Elevated white blood cell count, unspecified: Secondary | ICD-10-CM | POA: Diagnosis not present

## 2019-08-09 DIAGNOSIS — R1310 Dysphagia, unspecified: Secondary | ICD-10-CM | POA: Diagnosis not present

## 2019-08-09 LAB — BASIC METABOLIC PANEL - CANCER CENTER ONLY
Anion gap: 12 (ref 5–15)
BUN: 32 mg/dL — ABNORMAL HIGH (ref 8–23)
CO2: 22 mmol/L (ref 22–32)
Calcium: 8.3 mg/dL — ABNORMAL LOW (ref 8.9–10.3)
Chloride: 104 mmol/L (ref 98–111)
Creatinine: 1.28 mg/dL — ABNORMAL HIGH (ref 0.61–1.24)
GFR, Est AFR Am: >60 mL/min (ref >60)
GFR, Estimated: 58 mL/min — ABNORMAL LOW (ref >60)
Glucose, Bld: 268 mg/dL — ABNORMAL HIGH (ref 70–99)
Potassium: 4.6 mmol/L (ref 3.5–5.1)
Sodium: 138 mmol/L (ref 135–145)

## 2019-08-09 NOTE — Telephone Encounter (Signed)
-----   Message from Wyatt Portela, MD sent at 08/09/2019 12:17 PM EST ----- Please let him know his K is back to normal. No action is needed.

## 2019-08-09 NOTE — Telephone Encounter (Signed)
Called patient. No answer. No voicemail set up. Will continue to try to contact patient.

## 2019-08-10 ENCOUNTER — Telehealth: Payer: Self-pay

## 2019-08-10 NOTE — Telephone Encounter (Signed)
Contacted patient and let him know that per Dr. [name], potassium is back to normal and no action is needed. Patient verbalized understanding.

## 2019-08-21 ENCOUNTER — Other Ambulatory Visit: Payer: Self-pay | Admitting: Oncology

## 2019-08-21 DIAGNOSIS — C8317 Mantle cell lymphoma, spleen: Secondary | ICD-10-CM

## 2019-08-23 MED FILL — IMBRUVICA 560 MG TAB: 560 | 28 days supply | Qty: 28 | Fill #0

## 2019-09-28 DIAGNOSIS — Z794 Long term (current) use of insulin: Secondary | ICD-10-CM | POA: Diagnosis not present

## 2019-09-28 DIAGNOSIS — E1142 Type 2 diabetes mellitus with diabetic polyneuropathy: Secondary | ICD-10-CM | POA: Diagnosis not present

## 2019-10-04 ENCOUNTER — Inpatient Hospital Stay: Payer: Medicare HMO | Attending: Oncology | Admitting: Oncology

## 2019-10-04 ENCOUNTER — Inpatient Hospital Stay: Payer: Medicare HMO

## 2019-10-04 ENCOUNTER — Telehealth: Payer: Self-pay

## 2019-10-04 ENCOUNTER — Other Ambulatory Visit: Payer: Self-pay

## 2019-10-04 VITALS — BP 129/51 | HR 78 | Temp 98.4°F | Resp 18 | Ht 73.0 in | Wt 214.1 lb

## 2019-10-04 DIAGNOSIS — C8317 Mantle cell lymphoma, spleen: Secondary | ICD-10-CM

## 2019-10-04 LAB — CBC WITH DIFFERENTIAL (CANCER CENTER ONLY)
Abs Immature Granulocytes: 0 10*3/uL (ref 0.00–0.07)
Band Neutrophils: 0 %
Basophils Absolute: 0 10*3/uL (ref 0.0–0.1)
Basophils Relative: 0 %
Blasts: 0 %
Eosinophils Absolute: 0 10*3/uL (ref 0.0–0.5)
Eosinophils Relative: 0 %
HCT: 37.7 % — ABNORMAL LOW (ref 39.0–52.0)
Hemoglobin: 11.8 g/dL — ABNORMAL LOW (ref 13.0–17.0)
Lymphocytes Relative: 83 %
Lymphs Abs: 42.9 10*3/uL — ABNORMAL HIGH (ref 0.7–4.0)
MCH: 27.1 pg (ref 26.0–34.0)
MCHC: 31.3 g/dL (ref 30.0–36.0)
MCV: 86.7 fL (ref 80.0–100.0)
Metamyelocytes Relative: 0 %
Monocytes Absolute: 3.1 10*3/uL — ABNORMAL HIGH (ref 0.1–1.0)
Monocytes Relative: 6 %
Myelocytes: 0 %
Neutro Abs: 5.7 10*3/uL (ref 1.7–7.7)
Neutrophils Relative %: 11 %
Other: 0 %
Platelet Count: 110 10*3/uL — ABNORMAL LOW (ref 150–400)
Promyelocytes Relative: 0 %
RBC: 4.35 MIL/uL (ref 4.22–5.81)
RDW: 16.3 % — ABNORMAL HIGH (ref 11.5–15.5)
WBC Count: 51.7 10*3/uL (ref 4.0–10.5)
nRBC: 0 % (ref 0.0–0.2)
nRBC: 0 /100 WBC

## 2019-10-04 LAB — CMP (CANCER CENTER ONLY)
ALT: 10 U/L (ref 0–44)
AST: 13 U/L — ABNORMAL LOW (ref 15–41)
Albumin: 3.7 g/dL (ref 3.5–5.0)
Alkaline Phosphatase: 148 U/L — ABNORMAL HIGH (ref 38–126)
Anion gap: 11 (ref 5–15)
BUN: 27 mg/dL — ABNORMAL HIGH (ref 8–23)
CO2: 24 mmol/L (ref 22–32)
Calcium: 7.9 mg/dL — ABNORMAL LOW (ref 8.9–10.3)
Chloride: 103 mmol/L (ref 98–111)
Creatinine: 1.58 mg/dL — ABNORMAL HIGH (ref 0.61–1.24)
GFR, Est AFR Am: 52 mL/min — ABNORMAL LOW (ref >60)
GFR, Estimated: 45 mL/min — ABNORMAL LOW (ref >60)
Glucose, Bld: 374 mg/dL — ABNORMAL HIGH (ref 70–99)
Potassium: 4.5 mmol/L (ref 3.5–5.1)
Sodium: 138 mmol/L (ref 135–145)
Total Bilirubin: 1.3 mg/dL — ABNORMAL HIGH (ref 0.3–1.2)
Total Protein: 6.7 g/dL (ref 6.5–8.1)

## 2019-10-04 NOTE — Progress Notes (Signed)
Hematology and Oncology Follow Up Visit  Micheal Thompson [id number] 1950-11-16 69 y.o. 10/04/2019 8:22 AM   Principle Diagnosis: 69 year old man with mantle cell lymphoma diagnosed in 2014.  He was found to have stage IIIa disease presenting with splenomegaly and leukocytosis. Prior therapy:    Chemotherapy with  bendamustine and rituximab started on 02/28/2013. He is S/P 5 cycles completed in 07/2013. He achieved complete response at that time.  He developed relapsed disease in August 2018.  Ibrutinib 560 mg daily started in 03/2017.  Therapy withheld in December 2019 based on his request.  Current therapy: Ibrutinib 560 mg restarted in August 2020.   Interim History: Micheal Thompson returns today for a follow-up visit.  Since the last visit, he reports no major changes in his health.  He continues to tolerate ibrutinib without any major complications although he is experiencing periodic nausea and appetite changes.  He denies any hematochezia, melena or palpitation.  Denies any chest pain or difficulty breathing.  He has lost some weight since the last visit.            Medications: Without any changes on review. Current Outpatient Medications  Medication Sig Dispense Refill  . albuterol (PROVENTIL HFA;VENTOLIN HFA) 108 (90 Base) MCG/ACT inhaler Inhale into the lungs.    Marland Kitchen atorvastatin (LIPITOR) 80 MG tablet Take 80 mg by mouth every evening.     . B Complex-C (B-COMPLEX WITH VITAMIN C) tablet Take 1 tablet by mouth daily.    . ferrous sulfate 325 (65 FE) MG EC tablet Take 325 mg by mouth daily with breakfast.    . IMBRUVICA 560 MG TABS TAKE 1 TABLET (560MG ) BY MOUTH DAILY 28 tablet 0  . insulin regular human CONCENTRATED (HUMULIN R) 500 UNIT/ML SOLN injection Inject 6-16 Units into the skin 3 (three) times daily with meals. 12/14:  Pt with vial & drawing up in a U-100 syringe:  6 units (= 30 units)  in the morning, 16 units (= 80 units) lunch, 10 units (= 50 units) at night. May adjust  the 10 unit (50 unit) night dose using sliding scale as needed.    Marland Kitchen JARDIANCE 25 MG TABS tablet Take 25 mg by mouth at bedtime.     Marland Kitchen lisinopril (PRINIVIL,ZESTRIL) 10 MG tablet Take 10 mg by mouth at bedtime.     . Multiple Vitamins-Minerals (CENTRUM PO) Take by mouth daily.    Marland Kitchen oxyCODONE (OXY IR/ROXICODONE) 5 MG immediate release tablet Take 1-2 tablets (5-10 mg total) by mouth every 4 (four) hours as needed for moderate pain. (Patient not taking: Reported on 08/02/2019) 40 tablet 0  . sertraline (ZOLOFT) 100 MG tablet Take 100 mg by mouth daily.    . traZODone (DESYREL) 100 MG tablet Take 100 mg by mouth at bedtime.     No current facility-administered medications for this visit.     Allergies:  Allergies  Allergen Reactions  . No Known Allergies Other (See Comments)    Previously listed PCN Allergy not active.   Per Pt interview, "has taken a number of PCN derivatives" Taken Amoxicillin, Cephalosporins.        Physical Exam:   Blood pressure (!) 129/51, pulse 78, temperature 98.4 F (36.9 C), temperature source Temporal, resp. rate 18, height 6\' 1"  (1.854 m), weight 214 lb 1.6 oz (97.1 kg), SpO2 98 %.       ECOG:1    General appearance: Comfortable appearing without any discomfort Head: Normocephalic without any trauma Oropharynx: Mucous membranes are  moist and pink without any thrush or ulcers. Eyes: Pupils are equal and round reactive to light. Lymph nodes: No cervical, supraclavicular, inguinal or axillary lymphadenopathy.   Heart:regular rate and rhythm.  S1 and S2 without leg edema. Lung: Clear without any rhonchi or wheezes.  No dullness to percussion. Abdomin: Soft, nontender, nondistended with good bowel sounds.  No hepatosplenomegaly. Musculoskeletal: No joint deformity or effusion.  Full range of motion noted. Neurological: No deficits noted on motor, sensory and deep tendon reflex exam. Skin: No petechial rash or dryness.  Appeared moist.              CBC    Component Value Date/Time   WBC 30.4 (H) 08/02/2019 0831   WBC 15.4 (H) 11/26/2017 0850   RBC 5.28 08/02/2019 0831   HGB 14.8 08/02/2019 0831   HGB 16.2 08/13/2017 1236   HCT 47.5 08/02/2019 0831   HCT 50.8 (H) 08/13/2017 1236   PLT 136 (L) 08/02/2019 0831   PLT 160 08/13/2017 1236   MCV 90.0 08/02/2019 0831   MCV 89.0 08/13/2017 1236   MCH 28.0 08/02/2019 0831   MCHC 31.2 08/02/2019 0831   RDW 15.8 (H) 08/02/2019 0831   RDW 15.2 (H) 08/13/2017 1236   LYMPHSABS 21.0 (H) 08/02/2019 0831   LYMPHSABS 35.1 (H) 08/13/2017 1236   MONOABS 1.8 (H) 08/02/2019 0831   MONOABS 1.8 (H) 08/13/2017 1236   EOSABS 0.6 (H) 08/02/2019 0831   EOSABS 0.1 08/13/2017 1236   BASOSABS 0.0 08/02/2019 0831   BASOSABS 0.1 08/13/2017 1236      Chemistry      Component Value Date/Time   NA 138 08/09/2019 1119   NA 135 (L) 08/13/2017 1236   K 4.6 08/09/2019 1119   K 5.0 08/13/2017 1236   CL 104 08/09/2019 1119   CO2 22 08/09/2019 1119   CO2 22 08/13/2017 1236   BUN 32 (H) 08/09/2019 1119   BUN 28.7 (H) 08/13/2017 1236   CREATININE 1.28 (H) 08/09/2019 1119   CREATININE 1.5 (H) 08/13/2017 1236      Component Value Date/Time   CALCIUM 8.3 (L) 08/09/2019 1119   CALCIUM 8.8 08/13/2017 1236   ALKPHOS 114 08/02/2019 0831   ALKPHOS 109 08/13/2017 1236   AST 15 08/02/2019 0831   AST 21 08/13/2017 1236   ALT 11 08/02/2019 0831   ALT 21 08/13/2017 1236   BILITOT 0.6 08/02/2019 0831   BILITOT 1.44 (H) 08/13/2017 5765       69 year old man with:  1.  Mantle cell lymphoma diagnosed in 2014.  He was found to have stage IIIA disease with overall indolent course.     He has tolerated ibrutinib without any major complications at this time with a clinical improvement in his cervical lymphadenopathy.  Risks and benefits of continuing this therapy long-term were reviewed.  Alternative treatment options including chemotherapy as well as alternative oral targeted therapy were  reviewed.  The plan is to restage her with CT scan at this time and determine best course of action.  Potential continue with ibrutinib versus switching to a different agent will also be considered.  His white cell count still elevated with symptoms of mild anemia and thrombocytopenia.  We will continue to monitor him closely.  2.  Dysphasia: Improved at this time after treating his cervical lymphadenopathy.  3. Followup: In 4 weeks to follow his progress.  30  minutes were dedicated to this visit.  The time was spent on reviewing his disease status, laboratory data  review as well as treatment options. Zola Button, MD 2/24/20218:22 AM

## 2019-10-04 NOTE — Telephone Encounter (Signed)
Received a call from the lab reporting WBC is 51.7. Dr. [name] made aware. No further orders received at this time.

## 2019-10-05 ENCOUNTER — Telehealth: Payer: Self-pay | Admitting: Oncology

## 2019-10-05 NOTE — Telephone Encounter (Signed)
Scheduled appt per 2/24 los.  Sent a message to HIM pool to get a calendar mailed out.

## 2019-10-06 DIAGNOSIS — E1165 Type 2 diabetes mellitus with hyperglycemia: Secondary | ICD-10-CM | POA: Diagnosis not present

## 2019-10-06 DIAGNOSIS — A419 Sepsis, unspecified organism: Secondary | ICD-10-CM | POA: Diagnosis not present

## 2019-10-06 DIAGNOSIS — E785 Hyperlipidemia, unspecified: Secondary | ICD-10-CM | POA: Diagnosis not present

## 2019-10-06 DIAGNOSIS — Z20822 Contact with and (suspected) exposure to covid-19: Secondary | ICD-10-CM | POA: Diagnosis not present

## 2019-10-06 DIAGNOSIS — E1136 Type 2 diabetes mellitus with diabetic cataract: Secondary | ICD-10-CM | POA: Diagnosis not present

## 2019-10-06 DIAGNOSIS — D72829 Elevated white blood cell count, unspecified: Secondary | ICD-10-CM | POA: Diagnosis not present

## 2019-10-06 DIAGNOSIS — I491 Atrial premature depolarization: Secondary | ICD-10-CM | POA: Diagnosis not present

## 2019-10-06 DIAGNOSIS — C8310 Mantle cell lymphoma, unspecified site: Secondary | ICD-10-CM | POA: Diagnosis not present

## 2019-10-06 DIAGNOSIS — R0602 Shortness of breath: Secondary | ICD-10-CM | POA: Diagnosis not present

## 2019-10-06 DIAGNOSIS — I9589 Other hypotension: Secondary | ICD-10-CM | POA: Diagnosis not present

## 2019-10-06 DIAGNOSIS — Z794 Long term (current) use of insulin: Secondary | ICD-10-CM | POA: Diagnosis not present

## 2019-10-06 DIAGNOSIS — I10 Essential (primary) hypertension: Secondary | ICD-10-CM | POA: Diagnosis not present

## 2019-10-06 DIAGNOSIS — D72820 Lymphocytosis (symptomatic): Secondary | ICD-10-CM | POA: Diagnosis not present

## 2019-10-06 DIAGNOSIS — E875 Hyperkalemia: Secondary | ICD-10-CM | POA: Diagnosis not present

## 2019-10-06 DIAGNOSIS — E860 Dehydration: Secondary | ICD-10-CM | POA: Diagnosis not present

## 2019-10-06 DIAGNOSIS — E872 Acidosis: Secondary | ICD-10-CM | POA: Diagnosis not present

## 2019-10-06 DIAGNOSIS — N179 Acute kidney failure, unspecified: Secondary | ICD-10-CM | POA: Diagnosis not present

## 2019-10-06 DIAGNOSIS — E1100 Type 2 diabetes mellitus with hyperosmolarity without nonketotic hyperglycemic-hyperosmolar coma (NKHHC): Secondary | ICD-10-CM | POA: Diagnosis not present

## 2019-10-07 MED ORDER — ONDANSETRON HCL 4 MG/2ML IJ SOLN
4.00 | INTRAMUSCULAR | Status: DC
Start: ? — End: 2019-10-07

## 2019-10-07 MED ORDER — HEPARIN SODIUM (PORCINE) 5000 UNIT/ML IJ SOLN
5000.00 | INTRAMUSCULAR | Status: DC
Start: 2019-10-07 — End: 2019-10-07

## 2019-10-07 MED ORDER — INSULIN GLARGINE 100 UNIT/ML ~~LOC~~ SOLN
50.00 | SUBCUTANEOUS | Status: DC
Start: 2019-10-07 — End: 2019-10-07

## 2019-10-07 MED ORDER — GLUCOSE 40 % PO GEL
15.00 | ORAL | Status: DC
Start: ? — End: 2019-10-07

## 2019-10-07 MED ORDER — SENNOSIDES-DOCUSATE SODIUM 8.6-50 MG PO TABS
1.00 | ORAL_TABLET | ORAL | Status: DC
Start: ? — End: 2019-10-07

## 2019-10-07 MED ORDER — ATORVASTATIN CALCIUM 40 MG PO TABS
80.00 | ORAL_TABLET | ORAL | Status: DC
Start: 2019-10-07 — End: 2019-10-07

## 2019-10-07 MED ORDER — GLUCAGON (RDNA) 1 MG IJ KIT
1.00 | PACK | INTRAMUSCULAR | Status: DC
Start: ? — End: 2019-10-07

## 2019-10-07 MED ORDER — SODIUM CHLORIDE FLUSH 0.9 % IV SOLN
5.00 | INTRAVENOUS | Status: DC
Start: ? — End: 2019-10-07

## 2019-10-07 MED ORDER — FERROUS SULFATE 325 (65 FE) MG PO TABS
325.00 | ORAL_TABLET | ORAL | Status: DC
Start: 2019-10-08 — End: 2019-10-07

## 2019-10-07 MED ORDER — OXYCODONE-ACETAMINOPHEN 5-325 MG PO TABS
1.00 | ORAL_TABLET | ORAL | Status: DC
Start: ? — End: 2019-10-07

## 2019-10-07 MED ORDER — MELATONIN 3 MG PO TABS
3.00 | ORAL_TABLET | ORAL | Status: DC
Start: ? — End: 2019-10-07

## 2019-10-07 MED ORDER — ALBUTEROL SULFATE HFA 108 (90 BASE) MCG/ACT IN AERS
2.00 | INHALATION_SPRAY | RESPIRATORY_TRACT | Status: DC
Start: ? — End: 2019-10-07

## 2019-10-07 MED ORDER — ACETAMINOPHEN 325 MG PO TABS
650.00 | ORAL_TABLET | ORAL | Status: DC
Start: ? — End: 2019-10-07

## 2019-10-07 MED ORDER — TRAZODONE HCL 100 MG PO TABS
100.00 | ORAL_TABLET | ORAL | Status: DC
Start: 2019-10-07 — End: 2019-10-07

## 2019-10-07 MED ORDER — DEXTROSE 10 % IV SOLN
125.00 | INTRAVENOUS | Status: DC
Start: ? — End: 2019-10-07

## 2019-10-07 MED ORDER — GENERIC EXTERNAL MEDICATION
150.00 | Status: DC
Start: 2019-10-08 — End: 2019-10-07

## 2019-10-07 MED ORDER — INSULIN LISPRO 100 UNIT/ML ~~LOC~~ SOLN
2.00 | SUBCUTANEOUS | Status: DC
Start: 2019-10-07 — End: 2019-10-07

## 2019-10-07 MED ORDER — SODIUM CHLORIDE FLUSH 0.9 % IV SOLN
5.00 | INTRAVENOUS | Status: DC
Start: 2019-10-07 — End: 2019-10-07

## 2019-10-07 MED ORDER — SODIUM CHLORIDE 0.9 % IV SOLN
INTRAVENOUS | Status: DC
Start: ? — End: 2019-10-07

## 2019-10-17 ENCOUNTER — Ambulatory Visit (HOSPITAL_COMMUNITY): Payer: Medicare HMO

## 2019-10-17 DIAGNOSIS — R161 Splenomegaly, not elsewhere classified: Secondary | ICD-10-CM | POA: Diagnosis not present

## 2019-10-17 DIAGNOSIS — K91872 Postprocedural seroma of a digestive system organ or structure following a digestive system procedure: Secondary | ICD-10-CM | POA: Diagnosis not present

## 2019-10-17 DIAGNOSIS — J9811 Atelectasis: Secondary | ICD-10-CM | POA: Diagnosis not present

## 2019-10-17 DIAGNOSIS — I82890 Acute embolism and thrombosis of other specified veins: Secondary | ICD-10-CM | POA: Diagnosis not present

## 2019-10-17 DIAGNOSIS — R945 Abnormal results of liver function studies: Secondary | ICD-10-CM | POA: Diagnosis not present

## 2019-10-17 DIAGNOSIS — E119 Type 2 diabetes mellitus without complications: Secondary | ICD-10-CM | POA: Diagnosis not present

## 2019-10-17 DIAGNOSIS — D649 Anemia, unspecified: Secondary | ICD-10-CM | POA: Diagnosis not present

## 2019-10-17 DIAGNOSIS — R141 Gas pain: Secondary | ICD-10-CM | POA: Diagnosis not present

## 2019-10-17 DIAGNOSIS — E1165 Type 2 diabetes mellitus with hyperglycemia: Secondary | ICD-10-CM | POA: Diagnosis not present

## 2019-10-17 DIAGNOSIS — D61810 Antineoplastic chemotherapy induced pancytopenia: Secondary | ICD-10-CM | POA: Diagnosis not present

## 2019-10-17 DIAGNOSIS — R6521 Severe sepsis with septic shock: Secondary | ICD-10-CM | POA: Diagnosis not present

## 2019-10-17 DIAGNOSIS — J9601 Acute respiratory failure with hypoxia: Secondary | ICD-10-CM | POA: Diagnosis not present

## 2019-10-17 DIAGNOSIS — Z8619 Personal history of other infectious and parasitic diseases: Secondary | ICD-10-CM | POA: Diagnosis not present

## 2019-10-17 DIAGNOSIS — R1012 Left upper quadrant pain: Secondary | ICD-10-CM | POA: Diagnosis not present

## 2019-10-17 DIAGNOSIS — I878 Other specified disorders of veins: Secondary | ICD-10-CM | POA: Diagnosis not present

## 2019-10-17 DIAGNOSIS — I517 Cardiomegaly: Secondary | ICD-10-CM | POA: Diagnosis not present

## 2019-10-17 DIAGNOSIS — I493 Ventricular premature depolarization: Secondary | ICD-10-CM | POA: Diagnosis not present

## 2019-10-17 DIAGNOSIS — R1909 Other intra-abdominal and pelvic swelling, mass and lump: Secondary | ICD-10-CM | POA: Diagnosis not present

## 2019-10-17 DIAGNOSIS — Z9889 Other specified postprocedural states: Secondary | ICD-10-CM | POA: Diagnosis not present

## 2019-10-17 DIAGNOSIS — I499 Cardiac arrhythmia, unspecified: Secondary | ICD-10-CM | POA: Diagnosis not present

## 2019-10-17 DIAGNOSIS — R739 Hyperglycemia, unspecified: Secondary | ICD-10-CM | POA: Diagnosis not present

## 2019-10-17 DIAGNOSIS — R600 Localized edema: Secondary | ICD-10-CM | POA: Diagnosis not present

## 2019-10-17 DIAGNOSIS — E8779 Other fluid overload: Secondary | ICD-10-CM | POA: Diagnosis not present

## 2019-10-17 DIAGNOSIS — R69 Illness, unspecified: Secondary | ICD-10-CM | POA: Diagnosis not present

## 2019-10-17 DIAGNOSIS — E46 Unspecified protein-calorie malnutrition: Secondary | ICD-10-CM | POA: Diagnosis not present

## 2019-10-17 DIAGNOSIS — Z90411 Acquired partial absence of pancreas: Secondary | ICD-10-CM | POA: Diagnosis not present

## 2019-10-17 DIAGNOSIS — E872 Acidosis: Secondary | ICD-10-CM | POA: Diagnosis not present

## 2019-10-17 DIAGNOSIS — Z9081 Acquired absence of spleen: Secondary | ICD-10-CM | POA: Diagnosis not present

## 2019-10-17 DIAGNOSIS — E1169 Type 2 diabetes mellitus with other specified complication: Secondary | ICD-10-CM | POA: Diagnosis not present

## 2019-10-17 DIAGNOSIS — I340 Nonrheumatic mitral (valve) insufficiency: Secondary | ICD-10-CM | POA: Diagnosis not present

## 2019-10-17 DIAGNOSIS — D72829 Elevated white blood cell count, unspecified: Secondary | ICD-10-CM | POA: Diagnosis not present

## 2019-10-17 DIAGNOSIS — R042 Hemoptysis: Secondary | ICD-10-CM | POA: Diagnosis not present

## 2019-10-17 DIAGNOSIS — S36029A Unspecified contusion of spleen, initial encounter: Secondary | ICD-10-CM | POA: Diagnosis not present

## 2019-10-17 DIAGNOSIS — D735 Infarction of spleen: Secondary | ICD-10-CM | POA: Diagnosis not present

## 2019-10-17 DIAGNOSIS — Z8572 Personal history of non-Hodgkin lymphomas: Secondary | ICD-10-CM | POA: Diagnosis not present

## 2019-10-17 DIAGNOSIS — D7389 Other diseases of spleen: Secondary | ICD-10-CM | POA: Diagnosis not present

## 2019-10-17 DIAGNOSIS — R079 Chest pain, unspecified: Secondary | ICD-10-CM | POA: Diagnosis not present

## 2019-10-17 DIAGNOSIS — R109 Unspecified abdominal pain: Secondary | ICD-10-CM | POA: Diagnosis not present

## 2019-10-17 DIAGNOSIS — Z9049 Acquired absence of other specified parts of digestive tract: Secondary | ICD-10-CM | POA: Diagnosis not present

## 2019-10-17 DIAGNOSIS — I959 Hypotension, unspecified: Secondary | ICD-10-CM | POA: Diagnosis not present

## 2019-10-17 DIAGNOSIS — R5081 Fever presenting with conditions classified elsewhere: Secondary | ICD-10-CM | POA: Diagnosis not present

## 2019-10-17 DIAGNOSIS — J849 Interstitial pulmonary disease, unspecified: Secondary | ICD-10-CM | POA: Diagnosis not present

## 2019-10-17 DIAGNOSIS — G4700 Insomnia, unspecified: Secondary | ICD-10-CM | POA: Diagnosis not present

## 2019-10-17 DIAGNOSIS — Z20822 Contact with and (suspected) exposure to covid-19: Secondary | ICD-10-CM | POA: Diagnosis not present

## 2019-10-17 DIAGNOSIS — I358 Other nonrheumatic aortic valve disorders: Secondary | ICD-10-CM | POA: Diagnosis not present

## 2019-10-17 DIAGNOSIS — C8319 Mantle cell lymphoma, extranodal and solid organ sites: Secondary | ICD-10-CM | POA: Diagnosis not present

## 2019-10-17 DIAGNOSIS — D709 Neutropenia, unspecified: Secondary | ICD-10-CM | POA: Diagnosis not present

## 2019-10-17 DIAGNOSIS — E8881 Metabolic syndrome: Secondary | ICD-10-CM | POA: Diagnosis not present

## 2019-10-17 DIAGNOSIS — K9189 Other postprocedural complications and disorders of digestive system: Secondary | ICD-10-CM | POA: Diagnosis not present

## 2019-10-17 DIAGNOSIS — T380X5A Adverse effect of glucocorticoids and synthetic analogues, initial encounter: Secondary | ICD-10-CM | POA: Diagnosis not present

## 2019-10-17 DIAGNOSIS — A419 Sepsis, unspecified organism: Secondary | ICD-10-CM | POA: Diagnosis not present

## 2019-10-17 DIAGNOSIS — I491 Atrial premature depolarization: Secondary | ICD-10-CM | POA: Diagnosis not present

## 2019-10-17 DIAGNOSIS — Z95828 Presence of other vascular implants and grafts: Secondary | ICD-10-CM | POA: Diagnosis not present

## 2019-10-17 DIAGNOSIS — I77810 Thoracic aortic ectasia: Secondary | ICD-10-CM | POA: Diagnosis not present

## 2019-10-17 DIAGNOSIS — E669 Obesity, unspecified: Secondary | ICD-10-CM | POA: Diagnosis not present

## 2019-10-17 DIAGNOSIS — R5383 Other fatigue: Secondary | ICD-10-CM | POA: Diagnosis not present

## 2019-10-17 DIAGNOSIS — R1084 Generalized abdominal pain: Secondary | ICD-10-CM | POA: Diagnosis not present

## 2019-10-17 DIAGNOSIS — R188 Other ascites: Secondary | ICD-10-CM | POA: Diagnosis not present

## 2019-10-17 DIAGNOSIS — E1142 Type 2 diabetes mellitus with diabetic polyneuropathy: Secondary | ICD-10-CM | POA: Diagnosis not present

## 2019-10-17 DIAGNOSIS — I10 Essential (primary) hypertension: Secondary | ICD-10-CM | POA: Diagnosis not present

## 2019-10-17 DIAGNOSIS — D696 Thrombocytopenia, unspecified: Secondary | ICD-10-CM | POA: Diagnosis not present

## 2019-10-17 DIAGNOSIS — K8689 Other specified diseases of pancreas: Secondary | ICD-10-CM | POA: Diagnosis not present

## 2019-10-17 DIAGNOSIS — Z90410 Acquired total absence of pancreas: Secondary | ICD-10-CM | POA: Diagnosis not present

## 2019-10-17 DIAGNOSIS — Z9689 Presence of other specified functional implants: Secondary | ICD-10-CM | POA: Diagnosis not present

## 2019-10-17 DIAGNOSIS — C8310 Mantle cell lymphoma, unspecified site: Secondary | ICD-10-CM | POA: Diagnosis not present

## 2019-10-17 DIAGNOSIS — X58XXXA Exposure to other specified factors, initial encounter: Secondary | ICD-10-CM | POA: Diagnosis not present

## 2019-10-17 DIAGNOSIS — J90 Pleural effusion, not elsewhere classified: Secondary | ICD-10-CM | POA: Diagnosis not present

## 2019-10-17 DIAGNOSIS — S3609XA Other injury of spleen, initial encounter: Secondary | ICD-10-CM | POA: Diagnosis not present

## 2019-10-17 DIAGNOSIS — R918 Other nonspecific abnormal finding of lung field: Secondary | ICD-10-CM | POA: Diagnosis not present

## 2019-10-17 DIAGNOSIS — Z794 Long term (current) use of insulin: Secondary | ICD-10-CM | POA: Diagnosis not present

## 2019-10-18 DIAGNOSIS — Z9081 Acquired absence of spleen: Secondary | ICD-10-CM | POA: Diagnosis not present

## 2019-10-18 DIAGNOSIS — D649 Anemia, unspecified: Secondary | ICD-10-CM | POA: Diagnosis not present

## 2019-10-18 DIAGNOSIS — Z8572 Personal history of non-Hodgkin lymphomas: Secondary | ICD-10-CM | POA: Diagnosis not present

## 2019-10-18 DIAGNOSIS — D735 Infarction of spleen: Secondary | ICD-10-CM | POA: Diagnosis not present

## 2019-10-18 DIAGNOSIS — C8310 Mantle cell lymphoma, unspecified site: Secondary | ICD-10-CM | POA: Diagnosis not present

## 2019-10-18 DIAGNOSIS — D696 Thrombocytopenia, unspecified: Secondary | ICD-10-CM | POA: Diagnosis not present

## 2019-10-18 MED ORDER — DEXTROSE 10 % IV SOLN
125.00 | INTRAVENOUS | Status: DC
Start: ? — End: 2019-10-18

## 2019-10-18 MED ORDER — SODIUM CHLORIDE 0.9 % IV SOLN
250.00 | INTRAVENOUS | Status: DC
Start: ? — End: 2019-10-18

## 2019-10-18 MED ORDER — INSULIN LISPRO 100 UNIT/ML ~~LOC~~ SOLN
2.00 | SUBCUTANEOUS | Status: DC
Start: 2019-10-18 — End: 2019-10-18

## 2019-10-18 MED ORDER — PROPOFOL 100 MG/10ML IV EMUL
0.00 | INTRAVENOUS | Status: DC
Start: ? — End: 2019-10-18

## 2019-10-18 MED ORDER — ATORVASTATIN CALCIUM 40 MG PO TABS
80.00 | ORAL_TABLET | ORAL | Status: DC
Start: 2019-11-13 — End: 2019-10-18

## 2019-10-18 MED ORDER — GENERIC EXTERNAL MEDICATION
5.00 | Status: DC
Start: ? — End: 2019-10-18

## 2019-10-18 MED ORDER — ONDANSETRON HCL 4 MG/2ML IJ SOLN
4.00 | INTRAMUSCULAR | Status: DC
Start: ? — End: 2019-10-18

## 2019-10-18 MED ORDER — GLUCOSE 40 % PO GEL
15.00 | ORAL | Status: DC
Start: ? — End: 2019-10-18

## 2019-10-18 MED ORDER — NOREPINEPHRINE-SODIUM CHLORIDE 16-0.9 MG/250ML-% IV SOLN
0.00 | INTRAVENOUS | Status: DC
Start: ? — End: 2019-10-18

## 2019-10-18 MED ORDER — LACTATED RINGERS IV SOLN
INTRAVENOUS | Status: DC
Start: ? — End: 2019-10-18

## 2019-10-18 MED ORDER — GENERIC EXTERNAL MEDICATION
0.03 | Status: DC
Start: ? — End: 2019-10-18

## 2019-10-18 MED ORDER — SERTRALINE HCL 50 MG PO TABS
150.00 | ORAL_TABLET | ORAL | Status: DC
Start: 2019-11-14 — End: 2019-10-18

## 2019-10-18 MED ORDER — HYDROCODONE-ACETAMINOPHEN 5-325 MG PO TABS
1.00 | ORAL_TABLET | ORAL | Status: DC
Start: ? — End: 2019-10-18

## 2019-10-18 MED ORDER — MUPIROCIN 2 % EX OINT
TOPICAL_OINTMENT | CUTANEOUS | Status: DC
Start: 2019-10-18 — End: 2019-10-18

## 2019-10-18 MED ORDER — ACETAMINOPHEN 325 MG PO TABS
325.00 | ORAL_TABLET | ORAL | Status: DC
Start: 2019-10-18 — End: 2019-10-18

## 2019-10-19 DIAGNOSIS — Z9081 Acquired absence of spleen: Secondary | ICD-10-CM | POA: Diagnosis not present

## 2019-10-19 DIAGNOSIS — C8310 Mantle cell lymphoma, unspecified site: Secondary | ICD-10-CM | POA: Diagnosis not present

## 2019-10-20 DIAGNOSIS — I10 Essential (primary) hypertension: Secondary | ICD-10-CM | POA: Diagnosis not present

## 2019-10-20 DIAGNOSIS — Z794 Long term (current) use of insulin: Secondary | ICD-10-CM | POA: Diagnosis not present

## 2019-10-20 DIAGNOSIS — C8319 Mantle cell lymphoma, extranodal and solid organ sites: Secondary | ICD-10-CM | POA: Diagnosis not present

## 2019-10-20 DIAGNOSIS — C8310 Mantle cell lymphoma, unspecified site: Secondary | ICD-10-CM | POA: Diagnosis not present

## 2019-10-20 DIAGNOSIS — E119 Type 2 diabetes mellitus without complications: Secondary | ICD-10-CM | POA: Diagnosis not present

## 2019-10-20 DIAGNOSIS — R69 Illness, unspecified: Secondary | ICD-10-CM | POA: Diagnosis not present

## 2019-10-20 DIAGNOSIS — G4700 Insomnia, unspecified: Secondary | ICD-10-CM | POA: Diagnosis not present

## 2019-10-20 DIAGNOSIS — Z8572 Personal history of non-Hodgkin lymphomas: Secondary | ICD-10-CM | POA: Diagnosis not present

## 2019-10-20 DIAGNOSIS — D735 Infarction of spleen: Secondary | ICD-10-CM | POA: Diagnosis not present

## 2019-10-20 DIAGNOSIS — Z9081 Acquired absence of spleen: Secondary | ICD-10-CM | POA: Diagnosis not present

## 2019-10-20 DIAGNOSIS — E1165 Type 2 diabetes mellitus with hyperglycemia: Secondary | ICD-10-CM | POA: Diagnosis not present

## 2019-10-21 DIAGNOSIS — R600 Localized edema: Secondary | ICD-10-CM | POA: Diagnosis not present

## 2019-10-21 DIAGNOSIS — Z8572 Personal history of non-Hodgkin lymphomas: Secondary | ICD-10-CM | POA: Diagnosis not present

## 2019-10-21 DIAGNOSIS — Z9081 Acquired absence of spleen: Secondary | ICD-10-CM | POA: Diagnosis not present

## 2019-10-21 DIAGNOSIS — D7389 Other diseases of spleen: Secondary | ICD-10-CM | POA: Diagnosis not present

## 2019-10-21 DIAGNOSIS — E119 Type 2 diabetes mellitus without complications: Secondary | ICD-10-CM | POA: Diagnosis not present

## 2019-10-21 DIAGNOSIS — J90 Pleural effusion, not elsewhere classified: Secondary | ICD-10-CM | POA: Diagnosis not present

## 2019-10-21 DIAGNOSIS — D735 Infarction of spleen: Secondary | ICD-10-CM | POA: Diagnosis not present

## 2019-10-21 DIAGNOSIS — C8310 Mantle cell lymphoma, unspecified site: Secondary | ICD-10-CM | POA: Diagnosis not present

## 2019-10-21 DIAGNOSIS — Z9889 Other specified postprocedural states: Secondary | ICD-10-CM | POA: Diagnosis not present

## 2019-10-21 DIAGNOSIS — R188 Other ascites: Secondary | ICD-10-CM | POA: Diagnosis not present

## 2019-10-21 DIAGNOSIS — E1165 Type 2 diabetes mellitus with hyperglycemia: Secondary | ICD-10-CM | POA: Diagnosis not present

## 2019-10-21 DIAGNOSIS — Z794 Long term (current) use of insulin: Secondary | ICD-10-CM | POA: Diagnosis not present

## 2019-10-22 DIAGNOSIS — I340 Nonrheumatic mitral (valve) insufficiency: Secondary | ICD-10-CM | POA: Diagnosis not present

## 2019-10-22 DIAGNOSIS — I491 Atrial premature depolarization: Secondary | ICD-10-CM | POA: Diagnosis not present

## 2019-10-22 DIAGNOSIS — I878 Other specified disorders of veins: Secondary | ICD-10-CM | POA: Diagnosis not present

## 2019-10-22 DIAGNOSIS — E1165 Type 2 diabetes mellitus with hyperglycemia: Secondary | ICD-10-CM | POA: Diagnosis not present

## 2019-10-22 DIAGNOSIS — I517 Cardiomegaly: Secondary | ICD-10-CM | POA: Diagnosis not present

## 2019-10-22 DIAGNOSIS — C8310 Mantle cell lymphoma, unspecified site: Secondary | ICD-10-CM | POA: Diagnosis not present

## 2019-10-22 DIAGNOSIS — I77810 Thoracic aortic ectasia: Secondary | ICD-10-CM | POA: Diagnosis not present

## 2019-10-22 DIAGNOSIS — Z9081 Acquired absence of spleen: Secondary | ICD-10-CM | POA: Diagnosis not present

## 2019-10-22 DIAGNOSIS — I358 Other nonrheumatic aortic valve disorders: Secondary | ICD-10-CM | POA: Diagnosis not present

## 2019-10-22 DIAGNOSIS — E119 Type 2 diabetes mellitus without complications: Secondary | ICD-10-CM | POA: Diagnosis not present

## 2019-10-22 DIAGNOSIS — Z8572 Personal history of non-Hodgkin lymphomas: Secondary | ICD-10-CM | POA: Diagnosis not present

## 2019-10-22 DIAGNOSIS — D735 Infarction of spleen: Secondary | ICD-10-CM | POA: Diagnosis not present

## 2019-10-22 DIAGNOSIS — Z794 Long term (current) use of insulin: Secondary | ICD-10-CM | POA: Diagnosis not present

## 2019-10-23 DIAGNOSIS — Z794 Long term (current) use of insulin: Secondary | ICD-10-CM | POA: Diagnosis not present

## 2019-10-23 DIAGNOSIS — Z90411 Acquired partial absence of pancreas: Secondary | ICD-10-CM | POA: Diagnosis not present

## 2019-10-23 DIAGNOSIS — R918 Other nonspecific abnormal finding of lung field: Secondary | ICD-10-CM | POA: Diagnosis not present

## 2019-10-23 DIAGNOSIS — E119 Type 2 diabetes mellitus without complications: Secondary | ICD-10-CM | POA: Diagnosis not present

## 2019-10-23 DIAGNOSIS — Z9081 Acquired absence of spleen: Secondary | ICD-10-CM | POA: Diagnosis not present

## 2019-10-23 DIAGNOSIS — C8310 Mantle cell lymphoma, unspecified site: Secondary | ICD-10-CM | POA: Diagnosis not present

## 2019-10-23 DIAGNOSIS — D735 Infarction of spleen: Secondary | ICD-10-CM | POA: Diagnosis not present

## 2019-10-23 DIAGNOSIS — Z90410 Acquired total absence of pancreas: Secondary | ICD-10-CM | POA: Diagnosis not present

## 2019-10-23 DIAGNOSIS — K91872 Postprocedural seroma of a digestive system organ or structure following a digestive system procedure: Secondary | ICD-10-CM | POA: Diagnosis not present

## 2019-10-23 DIAGNOSIS — K8689 Other specified diseases of pancreas: Secondary | ICD-10-CM | POA: Diagnosis not present

## 2019-10-23 DIAGNOSIS — R042 Hemoptysis: Secondary | ICD-10-CM | POA: Diagnosis not present

## 2019-10-23 DIAGNOSIS — R1084 Generalized abdominal pain: Secondary | ICD-10-CM | POA: Diagnosis not present

## 2019-10-23 DIAGNOSIS — Z8572 Personal history of non-Hodgkin lymphomas: Secondary | ICD-10-CM | POA: Diagnosis not present

## 2019-10-23 DIAGNOSIS — E1165 Type 2 diabetes mellitus with hyperglycemia: Secondary | ICD-10-CM | POA: Diagnosis not present

## 2019-10-24 DIAGNOSIS — Z9049 Acquired absence of other specified parts of digestive tract: Secondary | ICD-10-CM | POA: Diagnosis not present

## 2019-10-24 DIAGNOSIS — E8779 Other fluid overload: Secondary | ICD-10-CM | POA: Diagnosis not present

## 2019-10-24 DIAGNOSIS — Z90411 Acquired partial absence of pancreas: Secondary | ICD-10-CM | POA: Diagnosis not present

## 2019-10-24 DIAGNOSIS — J90 Pleural effusion, not elsewhere classified: Secondary | ICD-10-CM | POA: Diagnosis not present

## 2019-10-24 DIAGNOSIS — C8310 Mantle cell lymphoma, unspecified site: Secondary | ICD-10-CM | POA: Diagnosis not present

## 2019-10-24 DIAGNOSIS — K8689 Other specified diseases of pancreas: Secondary | ICD-10-CM | POA: Diagnosis not present

## 2019-10-24 DIAGNOSIS — R918 Other nonspecific abnormal finding of lung field: Secondary | ICD-10-CM | POA: Diagnosis not present

## 2019-10-24 DIAGNOSIS — K9189 Other postprocedural complications and disorders of digestive system: Secondary | ICD-10-CM | POA: Diagnosis not present

## 2019-10-24 DIAGNOSIS — R1084 Generalized abdominal pain: Secondary | ICD-10-CM | POA: Diagnosis not present

## 2019-10-24 DIAGNOSIS — Z9889 Other specified postprocedural states: Secondary | ICD-10-CM | POA: Diagnosis not present

## 2019-10-25 DIAGNOSIS — C8310 Mantle cell lymphoma, unspecified site: Secondary | ICD-10-CM | POA: Diagnosis not present

## 2019-10-25 DIAGNOSIS — D735 Infarction of spleen: Secondary | ICD-10-CM | POA: Diagnosis not present

## 2019-10-25 DIAGNOSIS — Z9689 Presence of other specified functional implants: Secondary | ICD-10-CM | POA: Diagnosis not present

## 2019-10-25 DIAGNOSIS — K9189 Other postprocedural complications and disorders of digestive system: Secondary | ICD-10-CM | POA: Diagnosis not present

## 2019-10-25 DIAGNOSIS — E8881 Metabolic syndrome: Secondary | ICD-10-CM | POA: Diagnosis not present

## 2019-10-25 DIAGNOSIS — T380X5A Adverse effect of glucocorticoids and synthetic analogues, initial encounter: Secondary | ICD-10-CM | POA: Diagnosis not present

## 2019-10-25 DIAGNOSIS — Z794 Long term (current) use of insulin: Secondary | ICD-10-CM | POA: Diagnosis not present

## 2019-10-25 DIAGNOSIS — Z9081 Acquired absence of spleen: Secondary | ICD-10-CM | POA: Diagnosis not present

## 2019-10-25 DIAGNOSIS — E1165 Type 2 diabetes mellitus with hyperglycemia: Secondary | ICD-10-CM | POA: Diagnosis not present

## 2019-10-25 DIAGNOSIS — D696 Thrombocytopenia, unspecified: Secondary | ICD-10-CM | POA: Diagnosis not present

## 2019-10-25 DIAGNOSIS — Z8572 Personal history of non-Hodgkin lymphomas: Secondary | ICD-10-CM | POA: Diagnosis not present

## 2019-10-25 DIAGNOSIS — Z90411 Acquired partial absence of pancreas: Secondary | ICD-10-CM | POA: Diagnosis not present

## 2019-10-25 DIAGNOSIS — D72829 Elevated white blood cell count, unspecified: Secondary | ICD-10-CM | POA: Diagnosis not present

## 2019-10-25 DIAGNOSIS — R945 Abnormal results of liver function studies: Secondary | ICD-10-CM | POA: Diagnosis not present

## 2019-10-25 DIAGNOSIS — K8689 Other specified diseases of pancreas: Secondary | ICD-10-CM | POA: Diagnosis not present

## 2019-10-25 DIAGNOSIS — E119 Type 2 diabetes mellitus without complications: Secondary | ICD-10-CM | POA: Diagnosis not present

## 2019-10-25 DIAGNOSIS — E1169 Type 2 diabetes mellitus with other specified complication: Secondary | ICD-10-CM | POA: Diagnosis not present

## 2019-10-25 DIAGNOSIS — Z8619 Personal history of other infectious and parasitic diseases: Secondary | ICD-10-CM | POA: Diagnosis not present

## 2019-10-25 DIAGNOSIS — E669 Obesity, unspecified: Secondary | ICD-10-CM | POA: Diagnosis not present

## 2019-10-25 MED ORDER — ALLOPURINOL 300 MG PO TABS
300.00 | ORAL_TABLET | ORAL | Status: DC
Start: 2019-11-14 — End: 2019-10-25

## 2019-10-25 MED ORDER — PANTOPRAZOLE SODIUM 40 MG PO TBEC
40.00 | DELAYED_RELEASE_TABLET | ORAL | Status: DC
Start: 2019-11-14 — End: 2019-10-25

## 2019-10-25 MED ORDER — HAEMOPHILUS B POLYSAC CONJ VAC 10 MCG IJ SOLR
0.50 | INTRAMUSCULAR | Status: DC
Start: ? — End: 2019-10-25

## 2019-10-25 MED ORDER — DEXAMETHASONE 4 MG PO TABS
40.00 | ORAL_TABLET | ORAL | Status: DC
Start: 2019-10-26 — End: 2019-10-25

## 2019-10-25 MED ORDER — GLUCOSE 40 % PO GEL
15.00 | ORAL | Status: DC
Start: ? — End: 2019-10-25

## 2019-10-25 MED ORDER — PNEUMOCOCCAL 13-VAL CONJ VACC IM SUSP
0.50 | INTRAMUSCULAR | Status: DC
Start: ? — End: 2019-10-25

## 2019-10-25 MED ORDER — INSULIN GLARGINE 100 UNIT/ML ~~LOC~~ SOLN
25.00 | SUBCUTANEOUS | Status: DC
Start: 2019-10-25 — End: 2019-10-25

## 2019-10-25 MED ORDER — GENERIC EXTERNAL MEDICATION
75.00 | Status: DC
Start: 2019-11-13 — End: 2019-10-25

## 2019-10-25 MED ORDER — OXYCODONE HCL 5 MG PO TABS
5.00 | ORAL_TABLET | ORAL | Status: DC
Start: ? — End: 2019-10-25

## 2019-10-25 MED ORDER — POLYETHYLENE GLYCOL 3350 17 GM/SCOOP PO POWD
17.00 | ORAL | Status: DC
Start: 2019-11-13 — End: 2019-10-25

## 2019-10-25 MED ORDER — DEXTROSE 10 % IV SOLN
125.00 | INTRAVENOUS | Status: DC
Start: ? — End: 2019-10-25

## 2019-10-25 MED ORDER — LACTATED RINGERS IV SOLN
INTRAVENOUS | Status: DC
Start: ? — End: 2019-10-25

## 2019-10-25 MED ORDER — SENNOSIDES-DOCUSATE SODIUM 8.6-50 MG PO TABS
2.00 | ORAL_TABLET | ORAL | Status: DC
Start: 2019-11-13 — End: 2019-10-25

## 2019-10-25 MED ORDER — ALBUTEROL SULFATE (2.5 MG/3ML) 0.083% IN NEBU
2.50 | INHALATION_SOLUTION | RESPIRATORY_TRACT | Status: DC
Start: ? — End: 2019-10-25

## 2019-10-25 MED ORDER — HEPARIN SODIUM (PORCINE) 5000 UNIT/ML IJ SOLN
5000.00 | INTRAMUSCULAR | Status: DC
Start: 2019-10-25 — End: 2019-10-25

## 2019-10-25 MED ORDER — CYCLOBENZAPRINE HCL 5 MG PO TABS
10.00 | ORAL_TABLET | ORAL | Status: DC
Start: ? — End: 2019-10-25

## 2019-10-25 MED ORDER — INSULIN LISPRO 100 UNIT/ML ~~LOC~~ SOLN
2.00 | SUBCUTANEOUS | Status: DC
Start: 2019-10-25 — End: 2019-10-25

## 2019-10-26 DIAGNOSIS — E1142 Type 2 diabetes mellitus with diabetic polyneuropathy: Secondary | ICD-10-CM | POA: Diagnosis not present

## 2019-10-26 DIAGNOSIS — Z9689 Presence of other specified functional implants: Secondary | ICD-10-CM | POA: Diagnosis not present

## 2019-10-26 DIAGNOSIS — E1165 Type 2 diabetes mellitus with hyperglycemia: Secondary | ICD-10-CM | POA: Diagnosis not present

## 2019-10-26 DIAGNOSIS — Z794 Long term (current) use of insulin: Secondary | ICD-10-CM | POA: Diagnosis not present

## 2019-10-26 DIAGNOSIS — D72829 Elevated white blood cell count, unspecified: Secondary | ICD-10-CM | POA: Diagnosis not present

## 2019-10-26 DIAGNOSIS — E119 Type 2 diabetes mellitus without complications: Secondary | ICD-10-CM | POA: Diagnosis not present

## 2019-10-26 DIAGNOSIS — E8881 Metabolic syndrome: Secondary | ICD-10-CM | POA: Diagnosis not present

## 2019-10-26 DIAGNOSIS — Z9081 Acquired absence of spleen: Secondary | ICD-10-CM | POA: Diagnosis not present

## 2019-10-26 DIAGNOSIS — R945 Abnormal results of liver function studies: Secondary | ICD-10-CM | POA: Diagnosis not present

## 2019-10-26 DIAGNOSIS — Z90411 Acquired partial absence of pancreas: Secondary | ICD-10-CM | POA: Diagnosis not present

## 2019-10-26 DIAGNOSIS — D735 Infarction of spleen: Secondary | ICD-10-CM | POA: Diagnosis not present

## 2019-10-26 DIAGNOSIS — Z8619 Personal history of other infectious and parasitic diseases: Secondary | ICD-10-CM | POA: Diagnosis not present

## 2019-10-26 DIAGNOSIS — D7389 Other diseases of spleen: Secondary | ICD-10-CM | POA: Diagnosis not present

## 2019-10-26 DIAGNOSIS — E669 Obesity, unspecified: Secondary | ICD-10-CM | POA: Diagnosis not present

## 2019-10-26 DIAGNOSIS — T380X5A Adverse effect of glucocorticoids and synthetic analogues, initial encounter: Secondary | ICD-10-CM | POA: Diagnosis not present

## 2019-10-26 DIAGNOSIS — K8689 Other specified diseases of pancreas: Secondary | ICD-10-CM | POA: Diagnosis not present

## 2019-10-26 DIAGNOSIS — D696 Thrombocytopenia, unspecified: Secondary | ICD-10-CM | POA: Diagnosis not present

## 2019-10-26 DIAGNOSIS — K9189 Other postprocedural complications and disorders of digestive system: Secondary | ICD-10-CM | POA: Diagnosis not present

## 2019-10-26 DIAGNOSIS — C8310 Mantle cell lymphoma, unspecified site: Secondary | ICD-10-CM | POA: Diagnosis not present

## 2019-10-26 DIAGNOSIS — Z8572 Personal history of non-Hodgkin lymphomas: Secondary | ICD-10-CM | POA: Diagnosis not present

## 2019-10-26 DIAGNOSIS — E1169 Type 2 diabetes mellitus with other specified complication: Secondary | ICD-10-CM | POA: Diagnosis not present

## 2019-10-27 DIAGNOSIS — D72829 Elevated white blood cell count, unspecified: Secondary | ICD-10-CM | POA: Diagnosis not present

## 2019-10-27 DIAGNOSIS — Z8619 Personal history of other infectious and parasitic diseases: Secondary | ICD-10-CM | POA: Diagnosis not present

## 2019-10-27 DIAGNOSIS — T380X5A Adverse effect of glucocorticoids and synthetic analogues, initial encounter: Secondary | ICD-10-CM | POA: Diagnosis not present

## 2019-10-27 DIAGNOSIS — K9189 Other postprocedural complications and disorders of digestive system: Secondary | ICD-10-CM | POA: Diagnosis not present

## 2019-10-27 DIAGNOSIS — E1169 Type 2 diabetes mellitus with other specified complication: Secondary | ICD-10-CM | POA: Diagnosis not present

## 2019-10-27 DIAGNOSIS — R945 Abnormal results of liver function studies: Secondary | ICD-10-CM | POA: Diagnosis not present

## 2019-10-27 DIAGNOSIS — Z9081 Acquired absence of spleen: Secondary | ICD-10-CM | POA: Diagnosis not present

## 2019-10-27 DIAGNOSIS — E1165 Type 2 diabetes mellitus with hyperglycemia: Secondary | ICD-10-CM | POA: Diagnosis not present

## 2019-10-27 DIAGNOSIS — E669 Obesity, unspecified: Secondary | ICD-10-CM | POA: Diagnosis not present

## 2019-10-27 DIAGNOSIS — Z9689 Presence of other specified functional implants: Secondary | ICD-10-CM | POA: Diagnosis not present

## 2019-10-27 DIAGNOSIS — Z90411 Acquired partial absence of pancreas: Secondary | ICD-10-CM | POA: Diagnosis not present

## 2019-10-27 DIAGNOSIS — K8689 Other specified diseases of pancreas: Secondary | ICD-10-CM | POA: Diagnosis not present

## 2019-10-27 DIAGNOSIS — D735 Infarction of spleen: Secondary | ICD-10-CM | POA: Diagnosis not present

## 2019-10-27 DIAGNOSIS — Z794 Long term (current) use of insulin: Secondary | ICD-10-CM | POA: Diagnosis not present

## 2019-10-27 DIAGNOSIS — C8310 Mantle cell lymphoma, unspecified site: Secondary | ICD-10-CM | POA: Diagnosis not present

## 2019-10-27 DIAGNOSIS — D696 Thrombocytopenia, unspecified: Secondary | ICD-10-CM | POA: Diagnosis not present

## 2019-10-27 DIAGNOSIS — E8881 Metabolic syndrome: Secondary | ICD-10-CM | POA: Diagnosis not present

## 2019-10-27 DIAGNOSIS — E119 Type 2 diabetes mellitus without complications: Secondary | ICD-10-CM | POA: Diagnosis not present

## 2019-10-27 DIAGNOSIS — Z8572 Personal history of non-Hodgkin lymphomas: Secondary | ICD-10-CM | POA: Diagnosis not present

## 2019-10-28 DIAGNOSIS — Z794 Long term (current) use of insulin: Secondary | ICD-10-CM | POA: Diagnosis not present

## 2019-10-28 DIAGNOSIS — Z8572 Personal history of non-Hodgkin lymphomas: Secondary | ICD-10-CM | POA: Diagnosis not present

## 2019-10-28 DIAGNOSIS — E1165 Type 2 diabetes mellitus with hyperglycemia: Secondary | ICD-10-CM | POA: Diagnosis not present

## 2019-10-28 DIAGNOSIS — D735 Infarction of spleen: Secondary | ICD-10-CM | POA: Diagnosis not present

## 2019-10-28 DIAGNOSIS — R188 Other ascites: Secondary | ICD-10-CM | POA: Diagnosis not present

## 2019-10-28 DIAGNOSIS — Z9081 Acquired absence of spleen: Secondary | ICD-10-CM | POA: Diagnosis not present

## 2019-10-28 DIAGNOSIS — T380X5A Adverse effect of glucocorticoids and synthetic analogues, initial encounter: Secondary | ICD-10-CM | POA: Diagnosis not present

## 2019-10-28 DIAGNOSIS — C8310 Mantle cell lymphoma, unspecified site: Secondary | ICD-10-CM | POA: Diagnosis not present

## 2019-10-28 DIAGNOSIS — R1084 Generalized abdominal pain: Secondary | ICD-10-CM | POA: Diagnosis not present

## 2019-10-28 DIAGNOSIS — E119 Type 2 diabetes mellitus without complications: Secondary | ICD-10-CM | POA: Diagnosis not present

## 2019-10-29 DIAGNOSIS — D735 Infarction of spleen: Secondary | ICD-10-CM | POA: Diagnosis not present

## 2019-10-29 DIAGNOSIS — E1165 Type 2 diabetes mellitus with hyperglycemia: Secondary | ICD-10-CM | POA: Diagnosis not present

## 2019-10-29 DIAGNOSIS — Z9081 Acquired absence of spleen: Secondary | ICD-10-CM | POA: Diagnosis not present

## 2019-10-29 DIAGNOSIS — E119 Type 2 diabetes mellitus without complications: Secondary | ICD-10-CM | POA: Diagnosis not present

## 2019-10-29 DIAGNOSIS — C8310 Mantle cell lymphoma, unspecified site: Secondary | ICD-10-CM | POA: Diagnosis not present

## 2019-10-29 DIAGNOSIS — Z794 Long term (current) use of insulin: Secondary | ICD-10-CM | POA: Diagnosis not present

## 2019-10-29 DIAGNOSIS — Z8572 Personal history of non-Hodgkin lymphomas: Secondary | ICD-10-CM | POA: Diagnosis not present

## 2019-10-30 DIAGNOSIS — D61810 Antineoplastic chemotherapy induced pancytopenia: Secondary | ICD-10-CM | POA: Diagnosis not present

## 2019-10-30 DIAGNOSIS — T380X5A Adverse effect of glucocorticoids and synthetic analogues, initial encounter: Secondary | ICD-10-CM | POA: Diagnosis not present

## 2019-10-30 DIAGNOSIS — D735 Infarction of spleen: Secondary | ICD-10-CM | POA: Diagnosis not present

## 2019-10-30 DIAGNOSIS — Z8572 Personal history of non-Hodgkin lymphomas: Secondary | ICD-10-CM | POA: Diagnosis not present

## 2019-10-30 DIAGNOSIS — D72829 Elevated white blood cell count, unspecified: Secondary | ICD-10-CM | POA: Diagnosis not present

## 2019-10-30 DIAGNOSIS — Z9081 Acquired absence of spleen: Secondary | ICD-10-CM | POA: Diagnosis not present

## 2019-10-30 DIAGNOSIS — Z9689 Presence of other specified functional implants: Secondary | ICD-10-CM | POA: Diagnosis not present

## 2019-10-30 DIAGNOSIS — Z8619 Personal history of other infectious and parasitic diseases: Secondary | ICD-10-CM | POA: Diagnosis not present

## 2019-10-30 DIAGNOSIS — Z90411 Acquired partial absence of pancreas: Secondary | ICD-10-CM | POA: Diagnosis not present

## 2019-10-30 DIAGNOSIS — E1165 Type 2 diabetes mellitus with hyperglycemia: Secondary | ICD-10-CM | POA: Diagnosis not present

## 2019-10-30 DIAGNOSIS — E8881 Metabolic syndrome: Secondary | ICD-10-CM | POA: Diagnosis not present

## 2019-10-30 DIAGNOSIS — E669 Obesity, unspecified: Secondary | ICD-10-CM | POA: Diagnosis not present

## 2019-10-30 DIAGNOSIS — E1169 Type 2 diabetes mellitus with other specified complication: Secondary | ICD-10-CM | POA: Diagnosis not present

## 2019-10-30 DIAGNOSIS — R945 Abnormal results of liver function studies: Secondary | ICD-10-CM | POA: Diagnosis not present

## 2019-10-30 DIAGNOSIS — Z794 Long term (current) use of insulin: Secondary | ICD-10-CM | POA: Diagnosis not present

## 2019-10-30 DIAGNOSIS — K9189 Other postprocedural complications and disorders of digestive system: Secondary | ICD-10-CM | POA: Diagnosis not present

## 2019-10-30 DIAGNOSIS — D696 Thrombocytopenia, unspecified: Secondary | ICD-10-CM | POA: Diagnosis not present

## 2019-10-30 DIAGNOSIS — K8689 Other specified diseases of pancreas: Secondary | ICD-10-CM | POA: Diagnosis not present

## 2019-10-30 DIAGNOSIS — C8310 Mantle cell lymphoma, unspecified site: Secondary | ICD-10-CM | POA: Diagnosis not present

## 2019-10-31 DIAGNOSIS — D61810 Antineoplastic chemotherapy induced pancytopenia: Secondary | ICD-10-CM | POA: Diagnosis not present

## 2019-10-31 DIAGNOSIS — T380X5A Adverse effect of glucocorticoids and synthetic analogues, initial encounter: Secondary | ICD-10-CM | POA: Diagnosis not present

## 2019-10-31 DIAGNOSIS — E1165 Type 2 diabetes mellitus with hyperglycemia: Secondary | ICD-10-CM | POA: Diagnosis not present

## 2019-10-31 DIAGNOSIS — Z8572 Personal history of non-Hodgkin lymphomas: Secondary | ICD-10-CM | POA: Diagnosis not present

## 2019-10-31 DIAGNOSIS — Z794 Long term (current) use of insulin: Secondary | ICD-10-CM | POA: Diagnosis not present

## 2019-10-31 DIAGNOSIS — E1169 Type 2 diabetes mellitus with other specified complication: Secondary | ICD-10-CM | POA: Diagnosis not present

## 2019-10-31 DIAGNOSIS — C8310 Mantle cell lymphoma, unspecified site: Secondary | ICD-10-CM | POA: Diagnosis not present

## 2019-10-31 DIAGNOSIS — D735 Infarction of spleen: Secondary | ICD-10-CM | POA: Diagnosis not present

## 2019-10-31 DIAGNOSIS — E8881 Metabolic syndrome: Secondary | ICD-10-CM | POA: Diagnosis not present

## 2019-10-31 DIAGNOSIS — Z9081 Acquired absence of spleen: Secondary | ICD-10-CM | POA: Diagnosis not present

## 2019-10-31 DIAGNOSIS — E669 Obesity, unspecified: Secondary | ICD-10-CM | POA: Diagnosis not present

## 2019-10-31 MED ORDER — DEXTROSE 10 % IV SOLN
125.00 | INTRAVENOUS | Status: DC
Start: ? — End: 2019-10-31

## 2019-10-31 MED ORDER — FAMOTIDINE 20 MG PO TABS
20.00 | ORAL_TABLET | ORAL | Status: DC
Start: 2019-10-31 — End: 2019-10-31

## 2019-10-31 MED ORDER — PROCHLORPERAZINE MALEATE 5 MG PO TABS
5.00 | ORAL_TABLET | ORAL | Status: DC
Start: ? — End: 2019-10-31

## 2019-10-31 MED ORDER — GENERIC EXTERNAL MEDICATION
1.00 | Status: DC
Start: 2019-10-31 — End: 2019-10-31

## 2019-10-31 MED ORDER — ACETAMINOPHEN 325 MG PO TABS
650.00 | ORAL_TABLET | ORAL | Status: DC
Start: 2019-10-31 — End: 2019-10-31

## 2019-10-31 MED ORDER — INSULIN GLARGINE 100 UNIT/ML ~~LOC~~ SOLN
22.00 | SUBCUTANEOUS | Status: DC
Start: 2019-10-31 — End: 2019-10-31

## 2019-10-31 MED ORDER — DIPHENHYDRAMINE HCL 50 MG/ML IJ SOLN
50.00 | INTRAMUSCULAR | Status: DC
Start: 2019-10-31 — End: 2019-10-31

## 2019-10-31 MED ORDER — EPINEPHRINE PF 1 MG/ML IJ SOLN
0.30 | INTRAMUSCULAR | Status: DC
Start: ? — End: 2019-10-31

## 2019-10-31 MED ORDER — ONDANSETRON HCL 4 MG/2ML IJ SOLN
4.00 | INTRAMUSCULAR | Status: DC
Start: ? — End: 2019-10-31

## 2019-10-31 MED ORDER — LORAZEPAM 0.5 MG PO TABS
0.50 | ORAL_TABLET | ORAL | Status: DC
Start: ? — End: 2019-10-31

## 2019-10-31 MED ORDER — SODIUM CHLORIDE 0.9 % IV SOLN
INTRAVENOUS | Status: DC
Start: ? — End: 2019-10-31

## 2019-10-31 MED ORDER — INSULIN LISPRO 100 UNIT/ML ~~LOC~~ SOLN
3.00 | SUBCUTANEOUS | Status: DC
Start: 2019-10-31 — End: 2019-10-31

## 2019-10-31 MED ORDER — INSULIN LISPRO 100 UNIT/ML ~~LOC~~ SOLN
15.00 | SUBCUTANEOUS | Status: DC
Start: 2019-10-31 — End: 2019-10-31

## 2019-10-31 MED ORDER — ACYCLOVIR 400 MG PO TABS
400.00 | ORAL_TABLET | ORAL | Status: DC
Start: 2019-11-13 — End: 2019-10-31

## 2019-10-31 MED ORDER — METHYLPREDNISOLONE SODIUM SUCC 125 MG IJ SOLR
125.00 | INTRAMUSCULAR | Status: DC
Start: 2019-10-31 — End: 2019-10-31

## 2019-10-31 MED ORDER — GENERIC EXTERNAL MEDICATION
400.00 | Status: DC
Start: 2019-10-31 — End: 2019-10-31

## 2019-10-31 MED ORDER — FLUCONAZOLE 200 MG PO TABS
400.00 | ORAL_TABLET | ORAL | Status: DC
Start: 2019-10-31 — End: 2019-10-31

## 2019-10-31 MED ORDER — GLUCOSE 40 % PO GEL
15.00 | ORAL | Status: DC
Start: ? — End: 2019-10-31

## 2019-10-31 MED ORDER — MORPHINE SULFATE 4 MG/ML IJ SOLN
2.00 | INTRAMUSCULAR | Status: DC
Start: ? — End: 2019-10-31

## 2019-10-31 MED ORDER — METHYLPREDNISOLONE SODIUM SUCC 125 MG IJ SOLR
125.00 | INTRAMUSCULAR | Status: DC
Start: ? — End: 2019-10-31

## 2019-10-31 MED ORDER — METRONIDAZOLE 500 MG PO TABS
500.00 | ORAL_TABLET | ORAL | Status: DC
Start: 2019-10-31 — End: 2019-10-31

## 2019-10-31 MED ORDER — DIPHENHYDRAMINE HCL 50 MG/ML IJ SOLN
25.00 | INTRAMUSCULAR | Status: DC
Start: ? — End: 2019-10-31

## 2019-11-01 DIAGNOSIS — Z794 Long term (current) use of insulin: Secondary | ICD-10-CM | POA: Diagnosis not present

## 2019-11-01 DIAGNOSIS — E8881 Metabolic syndrome: Secondary | ICD-10-CM | POA: Diagnosis not present

## 2019-11-01 DIAGNOSIS — E1169 Type 2 diabetes mellitus with other specified complication: Secondary | ICD-10-CM | POA: Diagnosis not present

## 2019-11-01 DIAGNOSIS — Z8572 Personal history of non-Hodgkin lymphomas: Secondary | ICD-10-CM | POA: Diagnosis not present

## 2019-11-01 DIAGNOSIS — D61810 Antineoplastic chemotherapy induced pancytopenia: Secondary | ICD-10-CM | POA: Diagnosis not present

## 2019-11-01 DIAGNOSIS — E1165 Type 2 diabetes mellitus with hyperglycemia: Secondary | ICD-10-CM | POA: Diagnosis not present

## 2019-11-01 DIAGNOSIS — D735 Infarction of spleen: Secondary | ICD-10-CM | POA: Diagnosis not present

## 2019-11-01 DIAGNOSIS — E669 Obesity, unspecified: Secondary | ICD-10-CM | POA: Diagnosis not present

## 2019-11-01 DIAGNOSIS — Z9081 Acquired absence of spleen: Secondary | ICD-10-CM | POA: Diagnosis not present

## 2019-11-01 DIAGNOSIS — T380X5A Adverse effect of glucocorticoids and synthetic analogues, initial encounter: Secondary | ICD-10-CM | POA: Diagnosis not present

## 2019-11-01 DIAGNOSIS — C8310 Mantle cell lymphoma, unspecified site: Secondary | ICD-10-CM | POA: Diagnosis not present

## 2019-11-02 DIAGNOSIS — R1084 Generalized abdominal pain: Secondary | ICD-10-CM | POA: Diagnosis not present

## 2019-11-02 DIAGNOSIS — E8881 Metabolic syndrome: Secondary | ICD-10-CM | POA: Diagnosis not present

## 2019-11-02 DIAGNOSIS — E1165 Type 2 diabetes mellitus with hyperglycemia: Secondary | ICD-10-CM | POA: Diagnosis not present

## 2019-11-02 DIAGNOSIS — I959 Hypotension, unspecified: Secondary | ICD-10-CM | POA: Diagnosis not present

## 2019-11-02 DIAGNOSIS — T380X5A Adverse effect of glucocorticoids and synthetic analogues, initial encounter: Secondary | ICD-10-CM | POA: Diagnosis not present

## 2019-11-02 DIAGNOSIS — J9601 Acute respiratory failure with hypoxia: Secondary | ICD-10-CM | POA: Diagnosis not present

## 2019-11-02 DIAGNOSIS — R079 Chest pain, unspecified: Secondary | ICD-10-CM | POA: Diagnosis not present

## 2019-11-02 DIAGNOSIS — Z9081 Acquired absence of spleen: Secondary | ICD-10-CM | POA: Diagnosis not present

## 2019-11-02 DIAGNOSIS — Z8572 Personal history of non-Hodgkin lymphomas: Secondary | ICD-10-CM | POA: Diagnosis not present

## 2019-11-02 DIAGNOSIS — C8310 Mantle cell lymphoma, unspecified site: Secondary | ICD-10-CM | POA: Diagnosis not present

## 2019-11-02 DIAGNOSIS — D735 Infarction of spleen: Secondary | ICD-10-CM | POA: Diagnosis not present

## 2019-11-02 DIAGNOSIS — J9811 Atelectasis: Secondary | ICD-10-CM | POA: Diagnosis not present

## 2019-11-02 DIAGNOSIS — D61810 Antineoplastic chemotherapy induced pancytopenia: Secondary | ICD-10-CM | POA: Diagnosis not present

## 2019-11-02 DIAGNOSIS — J849 Interstitial pulmonary disease, unspecified: Secondary | ICD-10-CM | POA: Diagnosis not present

## 2019-11-02 DIAGNOSIS — J90 Pleural effusion, not elsewhere classified: Secondary | ICD-10-CM | POA: Diagnosis not present

## 2019-11-02 DIAGNOSIS — Z794 Long term (current) use of insulin: Secondary | ICD-10-CM | POA: Diagnosis not present

## 2019-11-02 DIAGNOSIS — C8319 Mantle cell lymphoma, extranodal and solid organ sites: Secondary | ICD-10-CM | POA: Diagnosis not present

## 2019-11-02 DIAGNOSIS — R141 Gas pain: Secondary | ICD-10-CM | POA: Diagnosis not present

## 2019-11-02 DIAGNOSIS — D7389 Other diseases of spleen: Secondary | ICD-10-CM | POA: Diagnosis not present

## 2019-11-03 DIAGNOSIS — T380X5A Adverse effect of glucocorticoids and synthetic analogues, initial encounter: Secondary | ICD-10-CM | POA: Diagnosis not present

## 2019-11-03 DIAGNOSIS — C8310 Mantle cell lymphoma, unspecified site: Secondary | ICD-10-CM | POA: Diagnosis not present

## 2019-11-03 DIAGNOSIS — E1165 Type 2 diabetes mellitus with hyperglycemia: Secondary | ICD-10-CM | POA: Diagnosis not present

## 2019-11-03 DIAGNOSIS — E8881 Metabolic syndrome: Secondary | ICD-10-CM | POA: Diagnosis not present

## 2019-11-03 DIAGNOSIS — D735 Infarction of spleen: Secondary | ICD-10-CM | POA: Diagnosis not present

## 2019-11-03 DIAGNOSIS — Z794 Long term (current) use of insulin: Secondary | ICD-10-CM | POA: Diagnosis not present

## 2019-11-03 DIAGNOSIS — Z9081 Acquired absence of spleen: Secondary | ICD-10-CM | POA: Diagnosis not present

## 2019-11-03 DIAGNOSIS — Z8572 Personal history of non-Hodgkin lymphomas: Secondary | ICD-10-CM | POA: Diagnosis not present

## 2019-11-03 DIAGNOSIS — D61810 Antineoplastic chemotherapy induced pancytopenia: Secondary | ICD-10-CM | POA: Diagnosis not present

## 2019-11-04 DIAGNOSIS — Z794 Long term (current) use of insulin: Secondary | ICD-10-CM | POA: Diagnosis not present

## 2019-11-04 DIAGNOSIS — C8310 Mantle cell lymphoma, unspecified site: Secondary | ICD-10-CM | POA: Diagnosis not present

## 2019-11-04 DIAGNOSIS — Z9081 Acquired absence of spleen: Secondary | ICD-10-CM | POA: Diagnosis not present

## 2019-11-04 DIAGNOSIS — D735 Infarction of spleen: Secondary | ICD-10-CM | POA: Diagnosis not present

## 2019-11-04 DIAGNOSIS — D61810 Antineoplastic chemotherapy induced pancytopenia: Secondary | ICD-10-CM | POA: Diagnosis not present

## 2019-11-04 DIAGNOSIS — Z8572 Personal history of non-Hodgkin lymphomas: Secondary | ICD-10-CM | POA: Diagnosis not present

## 2019-11-04 DIAGNOSIS — E669 Obesity, unspecified: Secondary | ICD-10-CM | POA: Diagnosis not present

## 2019-11-04 DIAGNOSIS — E8881 Metabolic syndrome: Secondary | ICD-10-CM | POA: Diagnosis not present

## 2019-11-04 DIAGNOSIS — E1165 Type 2 diabetes mellitus with hyperglycemia: Secondary | ICD-10-CM | POA: Diagnosis not present

## 2019-11-04 DIAGNOSIS — T380X5A Adverse effect of glucocorticoids and synthetic analogues, initial encounter: Secondary | ICD-10-CM | POA: Diagnosis not present

## 2019-11-04 DIAGNOSIS — E1169 Type 2 diabetes mellitus with other specified complication: Secondary | ICD-10-CM | POA: Diagnosis not present

## 2019-11-05 DIAGNOSIS — E8881 Metabolic syndrome: Secondary | ICD-10-CM | POA: Diagnosis not present

## 2019-11-05 DIAGNOSIS — C8310 Mantle cell lymphoma, unspecified site: Secondary | ICD-10-CM | POA: Diagnosis not present

## 2019-11-05 DIAGNOSIS — Z8572 Personal history of non-Hodgkin lymphomas: Secondary | ICD-10-CM | POA: Diagnosis not present

## 2019-11-05 DIAGNOSIS — Z9081 Acquired absence of spleen: Secondary | ICD-10-CM | POA: Diagnosis not present

## 2019-11-05 DIAGNOSIS — E1169 Type 2 diabetes mellitus with other specified complication: Secondary | ICD-10-CM | POA: Diagnosis not present

## 2019-11-05 DIAGNOSIS — D735 Infarction of spleen: Secondary | ICD-10-CM | POA: Diagnosis not present

## 2019-11-05 DIAGNOSIS — E1165 Type 2 diabetes mellitus with hyperglycemia: Secondary | ICD-10-CM | POA: Diagnosis not present

## 2019-11-05 DIAGNOSIS — Z794 Long term (current) use of insulin: Secondary | ICD-10-CM | POA: Diagnosis not present

## 2019-11-05 DIAGNOSIS — T380X5A Adverse effect of glucocorticoids and synthetic analogues, initial encounter: Secondary | ICD-10-CM | POA: Diagnosis not present

## 2019-11-05 DIAGNOSIS — D61810 Antineoplastic chemotherapy induced pancytopenia: Secondary | ICD-10-CM | POA: Diagnosis not present

## 2019-11-05 DIAGNOSIS — E669 Obesity, unspecified: Secondary | ICD-10-CM | POA: Diagnosis not present

## 2019-11-06 DIAGNOSIS — E1165 Type 2 diabetes mellitus with hyperglycemia: Secondary | ICD-10-CM | POA: Diagnosis not present

## 2019-11-06 DIAGNOSIS — E8881 Metabolic syndrome: Secondary | ICD-10-CM | POA: Diagnosis not present

## 2019-11-06 DIAGNOSIS — Z8572 Personal history of non-Hodgkin lymphomas: Secondary | ICD-10-CM | POA: Diagnosis not present

## 2019-11-06 DIAGNOSIS — Z794 Long term (current) use of insulin: Secondary | ICD-10-CM | POA: Diagnosis not present

## 2019-11-06 DIAGNOSIS — D61810 Antineoplastic chemotherapy induced pancytopenia: Secondary | ICD-10-CM | POA: Diagnosis not present

## 2019-11-06 DIAGNOSIS — C8310 Mantle cell lymphoma, unspecified site: Secondary | ICD-10-CM | POA: Diagnosis not present

## 2019-11-06 DIAGNOSIS — E1169 Type 2 diabetes mellitus with other specified complication: Secondary | ICD-10-CM | POA: Diagnosis not present

## 2019-11-06 DIAGNOSIS — E669 Obesity, unspecified: Secondary | ICD-10-CM | POA: Diagnosis not present

## 2019-11-06 DIAGNOSIS — T380X5A Adverse effect of glucocorticoids and synthetic analogues, initial encounter: Secondary | ICD-10-CM | POA: Diagnosis not present

## 2019-11-06 DIAGNOSIS — D735 Infarction of spleen: Secondary | ICD-10-CM | POA: Diagnosis not present

## 2019-11-06 DIAGNOSIS — Z9081 Acquired absence of spleen: Secondary | ICD-10-CM | POA: Diagnosis not present

## 2019-11-07 DIAGNOSIS — D61810 Antineoplastic chemotherapy induced pancytopenia: Secondary | ICD-10-CM | POA: Diagnosis not present

## 2019-11-07 DIAGNOSIS — Z95828 Presence of other vascular implants and grafts: Secondary | ICD-10-CM | POA: Diagnosis not present

## 2019-11-07 DIAGNOSIS — I959 Hypotension, unspecified: Secondary | ICD-10-CM | POA: Diagnosis not present

## 2019-11-07 DIAGNOSIS — R5081 Fever presenting with conditions classified elsewhere: Secondary | ICD-10-CM | POA: Diagnosis not present

## 2019-11-07 DIAGNOSIS — D709 Neutropenia, unspecified: Secondary | ICD-10-CM | POA: Diagnosis not present

## 2019-11-07 DIAGNOSIS — D735 Infarction of spleen: Secondary | ICD-10-CM | POA: Diagnosis not present

## 2019-11-07 DIAGNOSIS — R739 Hyperglycemia, unspecified: Secondary | ICD-10-CM | POA: Diagnosis not present

## 2019-11-07 DIAGNOSIS — C8310 Mantle cell lymphoma, unspecified site: Secondary | ICD-10-CM | POA: Diagnosis not present

## 2019-11-07 DIAGNOSIS — E1165 Type 2 diabetes mellitus with hyperglycemia: Secondary | ICD-10-CM | POA: Diagnosis not present

## 2019-11-07 DIAGNOSIS — E8881 Metabolic syndrome: Secondary | ICD-10-CM | POA: Diagnosis not present

## 2019-11-07 DIAGNOSIS — Z794 Long term (current) use of insulin: Secondary | ICD-10-CM | POA: Diagnosis not present

## 2019-11-07 DIAGNOSIS — Z9081 Acquired absence of spleen: Secondary | ICD-10-CM | POA: Diagnosis not present

## 2019-11-07 DIAGNOSIS — Z8572 Personal history of non-Hodgkin lymphomas: Secondary | ICD-10-CM | POA: Diagnosis not present

## 2019-11-07 DIAGNOSIS — R1084 Generalized abdominal pain: Secondary | ICD-10-CM | POA: Diagnosis not present

## 2019-11-07 DIAGNOSIS — E1169 Type 2 diabetes mellitus with other specified complication: Secondary | ICD-10-CM | POA: Diagnosis not present

## 2019-11-08 DIAGNOSIS — T380X5A Adverse effect of glucocorticoids and synthetic analogues, initial encounter: Secondary | ICD-10-CM | POA: Diagnosis not present

## 2019-11-08 DIAGNOSIS — Z95828 Presence of other vascular implants and grafts: Secondary | ICD-10-CM | POA: Diagnosis not present

## 2019-11-08 DIAGNOSIS — D709 Neutropenia, unspecified: Secondary | ICD-10-CM | POA: Diagnosis not present

## 2019-11-08 DIAGNOSIS — I959 Hypotension, unspecified: Secondary | ICD-10-CM | POA: Diagnosis not present

## 2019-11-08 DIAGNOSIS — R5081 Fever presenting with conditions classified elsewhere: Secondary | ICD-10-CM | POA: Diagnosis not present

## 2019-11-08 DIAGNOSIS — C8310 Mantle cell lymphoma, unspecified site: Secondary | ICD-10-CM | POA: Diagnosis not present

## 2019-11-08 DIAGNOSIS — E1169 Type 2 diabetes mellitus with other specified complication: Secondary | ICD-10-CM | POA: Diagnosis not present

## 2019-11-08 DIAGNOSIS — E8881 Metabolic syndrome: Secondary | ICD-10-CM | POA: Diagnosis not present

## 2019-11-08 DIAGNOSIS — Z794 Long term (current) use of insulin: Secondary | ICD-10-CM | POA: Diagnosis not present

## 2019-11-08 DIAGNOSIS — D735 Infarction of spleen: Secondary | ICD-10-CM | POA: Diagnosis not present

## 2019-11-08 DIAGNOSIS — D61810 Antineoplastic chemotherapy induced pancytopenia: Secondary | ICD-10-CM | POA: Diagnosis not present

## 2019-11-08 DIAGNOSIS — E1165 Type 2 diabetes mellitus with hyperglycemia: Secondary | ICD-10-CM | POA: Diagnosis not present

## 2019-11-08 MED ORDER — ACETAMINOPHEN 325 MG PO TABS
650.00 | ORAL_TABLET | ORAL | Status: DC
Start: ? — End: 2019-11-08

## 2019-11-08 MED ORDER — ALBUTEROL SULFATE (2.5 MG/3ML) 0.083% IN NEBU
2.50 | INHALATION_SOLUTION | RESPIRATORY_TRACT | Status: DC
Start: ? — End: 2019-11-08

## 2019-11-08 MED ORDER — ALUM & MAG HYDROXIDE-SIMETH 200-200-20 MG/5ML PO SUSP
30.00 | ORAL | Status: DC
Start: ? — End: 2019-11-08

## 2019-11-08 MED ORDER — FILGRASTIM-SNDZ 480 MCG/0.8ML IJ SOSY
5.00 | PREFILLED_SYRINGE | INTRAMUSCULAR | Status: DC
Start: 2019-11-08 — End: 2019-11-08

## 2019-11-08 MED ORDER — INSULIN LISPRO 100 UNIT/ML ~~LOC~~ SOLN
2.00 | SUBCUTANEOUS | Status: DC
Start: 2019-11-08 — End: 2019-11-08

## 2019-11-08 MED ORDER — SODIUM CHLORIDE 0.9 % IV SOLN
500.00 | INTRAVENOUS | Status: DC
Start: ? — End: 2019-11-08

## 2019-11-08 MED ORDER — INSULIN GLARGINE 100 UNIT/ML ~~LOC~~ SOLN
12.00 | SUBCUTANEOUS | Status: DC
Start: 2019-11-08 — End: 2019-11-08

## 2019-11-08 MED ORDER — SULFAMETHOXAZOLE-TRIMETHOPRIM 800-160 MG PO TABS
1.00 | ORAL_TABLET | ORAL | Status: DC
Start: 2019-11-15 — End: 2019-11-08

## 2019-11-08 MED ORDER — FLUCONAZOLE 200 MG PO TABS
400.00 | ORAL_TABLET | ORAL | Status: DC
Start: 2019-11-14 — End: 2019-11-08

## 2019-11-08 MED ORDER — INSULIN LISPRO 100 UNIT/ML ~~LOC~~ SOLN
6.00 | SUBCUTANEOUS | Status: DC
Start: 2019-11-08 — End: 2019-11-08

## 2019-11-08 MED ORDER — GENERIC EXTERNAL MEDICATION
2.00 | Status: DC
Start: 2019-11-08 — End: 2019-11-08

## 2019-11-08 MED ORDER — GABAPENTIN 300 MG PO CAPS
300.00 | ORAL_CAPSULE | ORAL | Status: DC
Start: 2019-11-13 — End: 2019-11-08

## 2019-11-08 MED ORDER — METRONIDAZOLE 500 MG PO TABS
500.00 | ORAL_TABLET | ORAL | Status: DC
Start: 2019-11-13 — End: 2019-11-08

## 2019-11-09 DIAGNOSIS — E1169 Type 2 diabetes mellitus with other specified complication: Secondary | ICD-10-CM | POA: Diagnosis not present

## 2019-11-09 DIAGNOSIS — E8881 Metabolic syndrome: Secondary | ICD-10-CM | POA: Diagnosis not present

## 2019-11-09 DIAGNOSIS — E1165 Type 2 diabetes mellitus with hyperglycemia: Secondary | ICD-10-CM | POA: Diagnosis not present

## 2019-11-09 DIAGNOSIS — R5081 Fever presenting with conditions classified elsewhere: Secondary | ICD-10-CM | POA: Diagnosis not present

## 2019-11-09 DIAGNOSIS — E669 Obesity, unspecified: Secondary | ICD-10-CM | POA: Diagnosis not present

## 2019-11-09 DIAGNOSIS — Z794 Long term (current) use of insulin: Secondary | ICD-10-CM | POA: Diagnosis not present

## 2019-11-09 DIAGNOSIS — D61810 Antineoplastic chemotherapy induced pancytopenia: Secondary | ICD-10-CM | POA: Diagnosis not present

## 2019-11-09 DIAGNOSIS — D709 Neutropenia, unspecified: Secondary | ICD-10-CM | POA: Diagnosis not present

## 2019-11-09 DIAGNOSIS — I959 Hypotension, unspecified: Secondary | ICD-10-CM | POA: Diagnosis not present

## 2019-11-09 DIAGNOSIS — T380X5A Adverse effect of glucocorticoids and synthetic analogues, initial encounter: Secondary | ICD-10-CM | POA: Diagnosis not present

## 2019-11-09 DIAGNOSIS — C8310 Mantle cell lymphoma, unspecified site: Secondary | ICD-10-CM | POA: Diagnosis not present

## 2019-11-10 DIAGNOSIS — E1165 Type 2 diabetes mellitus with hyperglycemia: Secondary | ICD-10-CM | POA: Diagnosis not present

## 2019-11-10 DIAGNOSIS — R5081 Fever presenting with conditions classified elsewhere: Secondary | ICD-10-CM | POA: Diagnosis not present

## 2019-11-10 DIAGNOSIS — E1169 Type 2 diabetes mellitus with other specified complication: Secondary | ICD-10-CM | POA: Diagnosis not present

## 2019-11-10 DIAGNOSIS — T380X5A Adverse effect of glucocorticoids and synthetic analogues, initial encounter: Secondary | ICD-10-CM | POA: Diagnosis not present

## 2019-11-10 DIAGNOSIS — E8881 Metabolic syndrome: Secondary | ICD-10-CM | POA: Diagnosis not present

## 2019-11-10 DIAGNOSIS — D61810 Antineoplastic chemotherapy induced pancytopenia: Secondary | ICD-10-CM | POA: Diagnosis not present

## 2019-11-10 DIAGNOSIS — C8310 Mantle cell lymphoma, unspecified site: Secondary | ICD-10-CM | POA: Diagnosis not present

## 2019-11-10 DIAGNOSIS — D709 Neutropenia, unspecified: Secondary | ICD-10-CM | POA: Diagnosis not present

## 2019-11-10 DIAGNOSIS — I959 Hypotension, unspecified: Secondary | ICD-10-CM | POA: Diagnosis not present

## 2019-11-10 DIAGNOSIS — Z794 Long term (current) use of insulin: Secondary | ICD-10-CM | POA: Diagnosis not present

## 2019-11-11 DIAGNOSIS — I959 Hypotension, unspecified: Secondary | ICD-10-CM | POA: Diagnosis not present

## 2019-11-11 DIAGNOSIS — E1165 Type 2 diabetes mellitus with hyperglycemia: Secondary | ICD-10-CM | POA: Diagnosis not present

## 2019-11-11 DIAGNOSIS — D61810 Antineoplastic chemotherapy induced pancytopenia: Secondary | ICD-10-CM | POA: Diagnosis not present

## 2019-11-11 DIAGNOSIS — D709 Neutropenia, unspecified: Secondary | ICD-10-CM | POA: Diagnosis not present

## 2019-11-11 DIAGNOSIS — R5081 Fever presenting with conditions classified elsewhere: Secondary | ICD-10-CM | POA: Diagnosis not present

## 2019-11-11 DIAGNOSIS — Z794 Long term (current) use of insulin: Secondary | ICD-10-CM | POA: Diagnosis not present

## 2019-11-11 DIAGNOSIS — C8310 Mantle cell lymphoma, unspecified site: Secondary | ICD-10-CM | POA: Diagnosis not present

## 2019-11-12 DIAGNOSIS — E1165 Type 2 diabetes mellitus with hyperglycemia: Secondary | ICD-10-CM | POA: Diagnosis not present

## 2019-11-12 DIAGNOSIS — R5081 Fever presenting with conditions classified elsewhere: Secondary | ICD-10-CM | POA: Diagnosis not present

## 2019-11-12 DIAGNOSIS — D61810 Antineoplastic chemotherapy induced pancytopenia: Secondary | ICD-10-CM | POA: Diagnosis not present

## 2019-11-12 DIAGNOSIS — Z794 Long term (current) use of insulin: Secondary | ICD-10-CM | POA: Diagnosis not present

## 2019-11-12 DIAGNOSIS — I959 Hypotension, unspecified: Secondary | ICD-10-CM | POA: Diagnosis not present

## 2019-11-12 DIAGNOSIS — D709 Neutropenia, unspecified: Secondary | ICD-10-CM | POA: Diagnosis not present

## 2019-11-12 DIAGNOSIS — C8310 Mantle cell lymphoma, unspecified site: Secondary | ICD-10-CM | POA: Diagnosis not present

## 2019-11-13 DIAGNOSIS — T380X5A Adverse effect of glucocorticoids and synthetic analogues, initial encounter: Secondary | ICD-10-CM | POA: Diagnosis not present

## 2019-11-13 DIAGNOSIS — Z794 Long term (current) use of insulin: Secondary | ICD-10-CM | POA: Diagnosis not present

## 2019-11-13 DIAGNOSIS — D61810 Antineoplastic chemotherapy induced pancytopenia: Secondary | ICD-10-CM | POA: Diagnosis not present

## 2019-11-13 DIAGNOSIS — E1165 Type 2 diabetes mellitus with hyperglycemia: Secondary | ICD-10-CM | POA: Diagnosis not present

## 2019-11-13 DIAGNOSIS — D709 Neutropenia, unspecified: Secondary | ICD-10-CM | POA: Diagnosis not present

## 2019-11-13 DIAGNOSIS — D7389 Other diseases of spleen: Secondary | ICD-10-CM | POA: Diagnosis not present

## 2019-11-13 DIAGNOSIS — C8310 Mantle cell lymphoma, unspecified site: Secondary | ICD-10-CM | POA: Diagnosis not present

## 2019-11-13 DIAGNOSIS — E8881 Metabolic syndrome: Secondary | ICD-10-CM | POA: Diagnosis not present

## 2019-11-13 DIAGNOSIS — R5081 Fever presenting with conditions classified elsewhere: Secondary | ICD-10-CM | POA: Diagnosis not present

## 2019-11-14 DIAGNOSIS — S3609XD Other injury of spleen, subsequent encounter: Secondary | ICD-10-CM | POA: Diagnosis not present

## 2019-11-14 DIAGNOSIS — C8590 Non-Hodgkin lymphoma, unspecified, unspecified site: Secondary | ICD-10-CM | POA: Diagnosis not present

## 2019-11-14 DIAGNOSIS — X58XXXD Exposure to other specified factors, subsequent encounter: Secondary | ICD-10-CM | POA: Diagnosis not present

## 2019-11-14 DIAGNOSIS — Z9081 Acquired absence of spleen: Secondary | ICD-10-CM | POA: Diagnosis not present

## 2019-11-14 MED ORDER — INSULIN GLARGINE 100 UNIT/ML ~~LOC~~ SOLN
10.00 | SUBCUTANEOUS | Status: DC
Start: 2019-11-13 — End: 2019-11-14

## 2019-11-14 MED ORDER — INSULIN LISPRO 100 UNIT/ML ~~LOC~~ SOLN
2.00 | SUBCUTANEOUS | Status: DC
Start: 2019-11-13 — End: 2019-11-14

## 2019-11-14 MED ORDER — CIPROFLOXACIN HCL 500 MG PO TABS
500.00 | ORAL_TABLET | ORAL | Status: DC
Start: 2019-11-13 — End: 2019-11-14

## 2019-11-14 MED ORDER — INSULIN LISPRO 100 UNIT/ML ~~LOC~~ SOLN
10.00 | SUBCUTANEOUS | Status: DC
Start: 2019-11-13 — End: 2019-11-14

## 2019-11-15 ENCOUNTER — Other Ambulatory Visit: Payer: Medicare HMO

## 2019-11-15 ENCOUNTER — Ambulatory Visit: Payer: Medicare HMO | Admitting: Oncology

## 2019-11-16 DIAGNOSIS — Z794 Long term (current) use of insulin: Secondary | ICD-10-CM | POA: Diagnosis not present

## 2019-11-16 DIAGNOSIS — E1165 Type 2 diabetes mellitus with hyperglycemia: Secondary | ICD-10-CM | POA: Diagnosis not present

## 2019-11-20 DIAGNOSIS — D6481 Anemia due to antineoplastic chemotherapy: Secondary | ICD-10-CM | POA: Diagnosis not present

## 2019-11-20 DIAGNOSIS — K8689 Other specified diseases of pancreas: Secondary | ICD-10-CM | POA: Diagnosis not present

## 2019-11-20 DIAGNOSIS — Z5111 Encounter for antineoplastic chemotherapy: Secondary | ICD-10-CM | POA: Diagnosis not present

## 2019-11-20 DIAGNOSIS — D696 Thrombocytopenia, unspecified: Secondary | ICD-10-CM | POA: Diagnosis not present

## 2019-11-20 DIAGNOSIS — D6959 Other secondary thrombocytopenia: Secondary | ICD-10-CM | POA: Diagnosis not present

## 2019-11-20 DIAGNOSIS — D473 Essential (hemorrhagic) thrombocythemia: Secondary | ICD-10-CM | POA: Diagnosis not present

## 2019-11-20 DIAGNOSIS — R5383 Other fatigue: Secondary | ICD-10-CM | POA: Diagnosis not present

## 2019-11-20 DIAGNOSIS — E1142 Type 2 diabetes mellitus with diabetic polyneuropathy: Secondary | ICD-10-CM | POA: Diagnosis not present

## 2019-11-20 DIAGNOSIS — E782 Mixed hyperlipidemia: Secondary | ICD-10-CM | POA: Diagnosis not present

## 2019-11-20 DIAGNOSIS — C8310 Mantle cell lymphoma, unspecified site: Secondary | ICD-10-CM | POA: Diagnosis not present

## 2019-11-20 DIAGNOSIS — T451X5A Adverse effect of antineoplastic and immunosuppressive drugs, initial encounter: Secondary | ICD-10-CM | POA: Diagnosis not present

## 2019-11-20 DIAGNOSIS — Z9081 Acquired absence of spleen: Secondary | ICD-10-CM | POA: Diagnosis not present

## 2019-11-20 DIAGNOSIS — X58XXXA Exposure to other specified factors, initial encounter: Secondary | ICD-10-CM | POA: Diagnosis not present

## 2019-11-20 DIAGNOSIS — E1165 Type 2 diabetes mellitus with hyperglycemia: Secondary | ICD-10-CM | POA: Diagnosis not present

## 2019-11-20 DIAGNOSIS — R69 Illness, unspecified: Secondary | ICD-10-CM | POA: Diagnosis not present

## 2019-11-24 DIAGNOSIS — C8310 Mantle cell lymphoma, unspecified site: Secondary | ICD-10-CM | POA: Diagnosis not present

## 2019-11-24 DIAGNOSIS — Z7689 Persons encountering health services in other specified circumstances: Secondary | ICD-10-CM | POA: Diagnosis not present

## 2019-11-26 DIAGNOSIS — E1142 Type 2 diabetes mellitus with diabetic polyneuropathy: Secondary | ICD-10-CM | POA: Diagnosis not present

## 2019-11-26 DIAGNOSIS — Z794 Long term (current) use of insulin: Secondary | ICD-10-CM | POA: Diagnosis not present

## 2019-11-27 DIAGNOSIS — I498 Other specified cardiac arrhythmias: Secondary | ICD-10-CM | POA: Diagnosis not present

## 2019-11-27 DIAGNOSIS — Z794 Long term (current) use of insulin: Secondary | ICD-10-CM | POA: Diagnosis not present

## 2019-11-27 DIAGNOSIS — R0600 Dyspnea, unspecified: Secondary | ICD-10-CM | POA: Diagnosis not present

## 2019-11-27 DIAGNOSIS — R531 Weakness: Secondary | ICD-10-CM | POA: Diagnosis not present

## 2019-11-27 DIAGNOSIS — E42 Marasmic kwashiorkor: Secondary | ICD-10-CM | POA: Diagnosis not present

## 2019-11-27 DIAGNOSIS — M79621 Pain in right upper arm: Secondary | ICD-10-CM | POA: Diagnosis not present

## 2019-11-27 DIAGNOSIS — M79601 Pain in right arm: Secondary | ICD-10-CM | POA: Diagnosis not present

## 2019-11-27 DIAGNOSIS — R0602 Shortness of breath: Secondary | ICD-10-CM | POA: Diagnosis not present

## 2019-11-27 DIAGNOSIS — M7989 Other specified soft tissue disorders: Secondary | ICD-10-CM | POA: Diagnosis not present

## 2019-11-27 DIAGNOSIS — R5381 Other malaise: Secondary | ICD-10-CM | POA: Diagnosis not present

## 2019-11-27 DIAGNOSIS — D72828 Other elevated white blood cell count: Secondary | ICD-10-CM | POA: Diagnosis not present

## 2019-11-27 DIAGNOSIS — E1165 Type 2 diabetes mellitus with hyperglycemia: Secondary | ICD-10-CM | POA: Diagnosis not present

## 2019-11-27 DIAGNOSIS — D696 Thrombocytopenia, unspecified: Secondary | ICD-10-CM | POA: Diagnosis not present

## 2019-11-27 DIAGNOSIS — C8310 Mantle cell lymphoma, unspecified site: Secondary | ICD-10-CM | POA: Diagnosis not present

## 2019-11-28 DIAGNOSIS — M7989 Other specified soft tissue disorders: Secondary | ICD-10-CM | POA: Diagnosis not present

## 2019-11-28 DIAGNOSIS — I82890 Acute embolism and thrombosis of other specified veins: Secondary | ICD-10-CM | POA: Diagnosis not present

## 2019-11-28 DIAGNOSIS — R42 Dizziness and giddiness: Secondary | ICD-10-CM | POA: Diagnosis not present

## 2019-11-28 DIAGNOSIS — I951 Orthostatic hypotension: Secondary | ICD-10-CM | POA: Diagnosis not present

## 2019-11-28 DIAGNOSIS — I808 Phlebitis and thrombophlebitis of other sites: Secondary | ICD-10-CM | POA: Diagnosis not present

## 2019-11-28 DIAGNOSIS — I10 Essential (primary) hypertension: Secondary | ICD-10-CM | POA: Diagnosis not present

## 2019-11-28 DIAGNOSIS — D696 Thrombocytopenia, unspecified: Secondary | ICD-10-CM | POA: Diagnosis not present

## 2019-11-28 DIAGNOSIS — I82A11 Acute embolism and thrombosis of right axillary vein: Secondary | ICD-10-CM | POA: Diagnosis not present

## 2019-11-28 DIAGNOSIS — D72828 Other elevated white blood cell count: Secondary | ICD-10-CM | POA: Diagnosis not present

## 2019-11-28 DIAGNOSIS — Z79899 Other long term (current) drug therapy: Secondary | ICD-10-CM | POA: Diagnosis not present

## 2019-11-28 DIAGNOSIS — Z95828 Presence of other vascular implants and grafts: Secondary | ICD-10-CM | POA: Diagnosis not present

## 2019-11-28 DIAGNOSIS — Z9889 Other specified postprocedural states: Secondary | ICD-10-CM | POA: Diagnosis not present

## 2019-11-28 DIAGNOSIS — I82621 Acute embolism and thrombosis of deep veins of right upper extremity: Secondary | ICD-10-CM | POA: Diagnosis not present

## 2019-11-28 DIAGNOSIS — E1165 Type 2 diabetes mellitus with hyperglycemia: Secondary | ICD-10-CM | POA: Diagnosis not present

## 2019-11-28 DIAGNOSIS — D72829 Elevated white blood cell count, unspecified: Secondary | ICD-10-CM | POA: Diagnosis not present

## 2019-11-28 DIAGNOSIS — R55 Syncope and collapse: Secondary | ICD-10-CM | POA: Diagnosis not present

## 2019-11-28 DIAGNOSIS — I82B11 Acute embolism and thrombosis of right subclavian vein: Secondary | ICD-10-CM | POA: Diagnosis not present

## 2019-11-28 DIAGNOSIS — C8310 Mantle cell lymphoma, unspecified site: Secondary | ICD-10-CM | POA: Diagnosis not present

## 2019-11-28 DIAGNOSIS — R531 Weakness: Secondary | ICD-10-CM | POA: Diagnosis not present

## 2019-11-30 DIAGNOSIS — C8310 Mantle cell lymphoma, unspecified site: Secondary | ICD-10-CM | POA: Diagnosis not present

## 2019-12-05 DIAGNOSIS — X58XXXA Exposure to other specified factors, initial encounter: Secondary | ICD-10-CM | POA: Diagnosis not present

## 2019-12-05 DIAGNOSIS — T8143XA Infection following a procedure, organ and space surgical site, initial encounter: Secondary | ICD-10-CM | POA: Diagnosis not present

## 2019-12-07 DIAGNOSIS — C8310 Mantle cell lymphoma, unspecified site: Secondary | ICD-10-CM | POA: Diagnosis not present

## 2019-12-07 DIAGNOSIS — Z23 Encounter for immunization: Secondary | ICD-10-CM | POA: Diagnosis not present

## 2019-12-11 DIAGNOSIS — D72829 Elevated white blood cell count, unspecified: Secondary | ICD-10-CM | POA: Diagnosis not present

## 2019-12-11 DIAGNOSIS — D6481 Anemia due to antineoplastic chemotherapy: Secondary | ICD-10-CM | POA: Diagnosis not present

## 2019-12-11 DIAGNOSIS — Z79899 Other long term (current) drug therapy: Secondary | ICD-10-CM | POA: Diagnosis not present

## 2019-12-11 DIAGNOSIS — E2740 Unspecified adrenocortical insufficiency: Secondary | ICD-10-CM | POA: Diagnosis not present

## 2019-12-11 DIAGNOSIS — E782 Mixed hyperlipidemia: Secondary | ICD-10-CM | POA: Diagnosis not present

## 2019-12-11 DIAGNOSIS — Y9289 Other specified places as the place of occurrence of the external cause: Secondary | ICD-10-CM | POA: Diagnosis not present

## 2019-12-11 DIAGNOSIS — E1165 Type 2 diabetes mellitus with hyperglycemia: Secondary | ICD-10-CM | POA: Diagnosis not present

## 2019-12-11 DIAGNOSIS — I9589 Other hypotension: Secondary | ICD-10-CM | POA: Diagnosis not present

## 2019-12-11 DIAGNOSIS — I82629 Acute embolism and thrombosis of deep veins of unspecified upper extremity: Secondary | ICD-10-CM | POA: Diagnosis not present

## 2019-12-11 DIAGNOSIS — Z5111 Encounter for antineoplastic chemotherapy: Secondary | ICD-10-CM | POA: Diagnosis not present

## 2019-12-11 DIAGNOSIS — C8310 Mantle cell lymphoma, unspecified site: Secondary | ICD-10-CM | POA: Diagnosis not present

## 2019-12-11 DIAGNOSIS — E1142 Type 2 diabetes mellitus with diabetic polyneuropathy: Secondary | ICD-10-CM | POA: Diagnosis not present

## 2019-12-11 DIAGNOSIS — R69 Illness, unspecified: Secondary | ICD-10-CM | POA: Diagnosis not present

## 2019-12-11 DIAGNOSIS — I82621 Acute embolism and thrombosis of deep veins of right upper extremity: Secondary | ICD-10-CM | POA: Diagnosis not present

## 2019-12-14 DIAGNOSIS — Z5111 Encounter for antineoplastic chemotherapy: Secondary | ICD-10-CM | POA: Diagnosis not present

## 2019-12-14 DIAGNOSIS — C8319 Mantle cell lymphoma, extranodal and solid organ sites: Secondary | ICD-10-CM | POA: Diagnosis not present

## 2019-12-18 DIAGNOSIS — C8310 Mantle cell lymphoma, unspecified site: Secondary | ICD-10-CM | POA: Diagnosis not present

## 2019-12-21 DIAGNOSIS — C8310 Mantle cell lymphoma, unspecified site: Secondary | ICD-10-CM | POA: Diagnosis not present

## 2019-12-25 DIAGNOSIS — D696 Thrombocytopenia, unspecified: Secondary | ICD-10-CM | POA: Diagnosis not present

## 2019-12-25 DIAGNOSIS — E1165 Type 2 diabetes mellitus with hyperglycemia: Secondary | ICD-10-CM | POA: Diagnosis not present

## 2019-12-25 DIAGNOSIS — C8310 Mantle cell lymphoma, unspecified site: Secondary | ICD-10-CM | POA: Diagnosis not present

## 2019-12-25 DIAGNOSIS — M7022 Olecranon bursitis, left elbow: Secondary | ICD-10-CM | POA: Diagnosis not present

## 2019-12-25 DIAGNOSIS — C8318 Mantle cell lymphoma, lymph nodes of multiple sites: Secondary | ICD-10-CM | POA: Diagnosis not present

## 2019-12-25 DIAGNOSIS — Z794 Long term (current) use of insulin: Secondary | ICD-10-CM | POA: Diagnosis not present

## 2019-12-25 DIAGNOSIS — Z7901 Long term (current) use of anticoagulants: Secondary | ICD-10-CM | POA: Diagnosis not present

## 2019-12-25 DIAGNOSIS — I82621 Acute embolism and thrombosis of deep veins of right upper extremity: Secondary | ICD-10-CM | POA: Diagnosis not present

## 2019-12-25 DIAGNOSIS — M71122 Other infective bursitis, left elbow: Secondary | ICD-10-CM | POA: Diagnosis not present

## 2019-12-25 DIAGNOSIS — L03114 Cellulitis of left upper limb: Secondary | ICD-10-CM | POA: Diagnosis not present

## 2019-12-25 DIAGNOSIS — M7989 Other specified soft tissue disorders: Secondary | ICD-10-CM | POA: Diagnosis not present

## 2019-12-25 DIAGNOSIS — Z87891 Personal history of nicotine dependence: Secondary | ICD-10-CM | POA: Diagnosis not present

## 2019-12-25 DIAGNOSIS — E1142 Type 2 diabetes mellitus with diabetic polyneuropathy: Secondary | ICD-10-CM | POA: Diagnosis not present

## 2019-12-25 DIAGNOSIS — I82B12 Acute embolism and thrombosis of left subclavian vein: Secondary | ICD-10-CM | POA: Diagnosis not present

## 2019-12-25 DIAGNOSIS — M25522 Pain in left elbow: Secondary | ICD-10-CM | POA: Diagnosis not present

## 2019-12-25 DIAGNOSIS — I9589 Other hypotension: Secondary | ICD-10-CM | POA: Diagnosis not present

## 2019-12-25 DIAGNOSIS — J449 Chronic obstructive pulmonary disease, unspecified: Secondary | ICD-10-CM | POA: Diagnosis not present

## 2019-12-25 DIAGNOSIS — R69 Illness, unspecified: Secondary | ICD-10-CM | POA: Diagnosis not present

## 2019-12-25 DIAGNOSIS — Z9081 Acquired absence of spleen: Secondary | ICD-10-CM | POA: Diagnosis not present

## 2020-01-15 DIAGNOSIS — I10 Essential (primary) hypertension: Secondary | ICD-10-CM | POA: Diagnosis not present

## 2020-01-15 DIAGNOSIS — L7634 Postprocedural seroma of skin and subcutaneous tissue following other procedure: Secondary | ICD-10-CM | POA: Diagnosis not present

## 2020-01-15 DIAGNOSIS — E1142 Type 2 diabetes mellitus with diabetic polyneuropathy: Secondary | ICD-10-CM | POA: Diagnosis not present

## 2020-01-15 DIAGNOSIS — D6481 Anemia due to antineoplastic chemotherapy: Secondary | ICD-10-CM | POA: Diagnosis not present

## 2020-01-15 DIAGNOSIS — M71122 Other infective bursitis, left elbow: Secondary | ICD-10-CM | POA: Diagnosis not present

## 2020-01-15 DIAGNOSIS — C8310 Mantle cell lymphoma, unspecified site: Secondary | ICD-10-CM | POA: Diagnosis not present

## 2020-01-15 DIAGNOSIS — S3609XA Other injury of spleen, initial encounter: Secondary | ICD-10-CM | POA: Diagnosis not present

## 2020-01-15 DIAGNOSIS — E2740 Unspecified adrenocortical insufficiency: Secondary | ICD-10-CM | POA: Diagnosis not present

## 2020-01-15 DIAGNOSIS — I82621 Acute embolism and thrombosis of deep veins of right upper extremity: Secondary | ICD-10-CM | POA: Diagnosis not present

## 2020-01-15 DIAGNOSIS — Z5111 Encounter for antineoplastic chemotherapy: Secondary | ICD-10-CM | POA: Diagnosis not present

## 2020-01-15 DIAGNOSIS — E782 Mixed hyperlipidemia: Secondary | ICD-10-CM | POA: Diagnosis not present

## 2020-01-15 DIAGNOSIS — R69 Illness, unspecified: Secondary | ICD-10-CM | POA: Diagnosis not present

## 2020-01-15 DIAGNOSIS — E1165 Type 2 diabetes mellitus with hyperglycemia: Secondary | ICD-10-CM | POA: Diagnosis not present

## 2020-01-16 DIAGNOSIS — C8310 Mantle cell lymphoma, unspecified site: Secondary | ICD-10-CM | POA: Diagnosis not present

## 2020-01-16 DIAGNOSIS — Z5111 Encounter for antineoplastic chemotherapy: Secondary | ICD-10-CM | POA: Diagnosis not present

## 2020-01-16 DIAGNOSIS — M71122 Other infective bursitis, left elbow: Secondary | ICD-10-CM | POA: Diagnosis not present

## 2020-01-16 DIAGNOSIS — I82621 Acute embolism and thrombosis of deep veins of right upper extremity: Secondary | ICD-10-CM | POA: Diagnosis not present

## 2020-01-16 DIAGNOSIS — D6481 Anemia due to antineoplastic chemotherapy: Secondary | ICD-10-CM | POA: Diagnosis not present

## 2020-01-16 DIAGNOSIS — L7634 Postprocedural seroma of skin and subcutaneous tissue following other procedure: Secondary | ICD-10-CM | POA: Diagnosis not present

## 2020-01-17 DIAGNOSIS — C8310 Mantle cell lymphoma, unspecified site: Secondary | ICD-10-CM | POA: Diagnosis not present

## 2020-01-17 DIAGNOSIS — I82621 Acute embolism and thrombosis of deep veins of right upper extremity: Secondary | ICD-10-CM | POA: Diagnosis not present

## 2020-01-17 DIAGNOSIS — M71122 Other infective bursitis, left elbow: Secondary | ICD-10-CM | POA: Diagnosis not present

## 2020-01-17 DIAGNOSIS — L7634 Postprocedural seroma of skin and subcutaneous tissue following other procedure: Secondary | ICD-10-CM | POA: Diagnosis not present

## 2020-01-17 DIAGNOSIS — D6481 Anemia due to antineoplastic chemotherapy: Secondary | ICD-10-CM | POA: Diagnosis not present

## 2020-01-17 DIAGNOSIS — Z5111 Encounter for antineoplastic chemotherapy: Secondary | ICD-10-CM | POA: Diagnosis not present

## 2020-01-19 DIAGNOSIS — C8310 Mantle cell lymphoma, unspecified site: Secondary | ICD-10-CM | POA: Diagnosis not present

## 2020-01-19 DIAGNOSIS — Z5111 Encounter for antineoplastic chemotherapy: Secondary | ICD-10-CM | POA: Diagnosis not present

## 2020-01-19 DIAGNOSIS — Z79899 Other long term (current) drug therapy: Secondary | ICD-10-CM | POA: Diagnosis not present

## 2020-01-25 DIAGNOSIS — C8310 Mantle cell lymphoma, unspecified site: Secondary | ICD-10-CM | POA: Diagnosis not present

## 2020-01-26 DIAGNOSIS — Z794 Long term (current) use of insulin: Secondary | ICD-10-CM | POA: Diagnosis not present

## 2020-01-26 DIAGNOSIS — E1142 Type 2 diabetes mellitus with diabetic polyneuropathy: Secondary | ICD-10-CM | POA: Diagnosis not present

## 2020-01-29 DIAGNOSIS — C8310 Mantle cell lymphoma, unspecified site: Secondary | ICD-10-CM | POA: Diagnosis not present

## 2020-01-30 DIAGNOSIS — Z9689 Presence of other specified functional implants: Secondary | ICD-10-CM | POA: Diagnosis not present

## 2020-01-30 DIAGNOSIS — K8689 Other specified diseases of pancreas: Secondary | ICD-10-CM | POA: Diagnosis not present

## 2020-01-30 DIAGNOSIS — Z9049 Acquired absence of other specified parts of digestive tract: Secondary | ICD-10-CM | POA: Diagnosis not present

## 2020-01-30 DIAGNOSIS — Z90411 Acquired partial absence of pancreas: Secondary | ICD-10-CM | POA: Diagnosis not present

## 2020-01-30 DIAGNOSIS — C8310 Mantle cell lymphoma, unspecified site: Secondary | ICD-10-CM | POA: Diagnosis not present

## 2020-01-30 DIAGNOSIS — K9189 Other postprocedural complications and disorders of digestive system: Secondary | ICD-10-CM | POA: Diagnosis not present

## 2020-01-30 DIAGNOSIS — I10 Essential (primary) hypertension: Secondary | ICD-10-CM | POA: Diagnosis not present

## 2020-01-30 DIAGNOSIS — E119 Type 2 diabetes mellitus without complications: Secondary | ICD-10-CM | POA: Diagnosis not present

## 2020-01-30 DIAGNOSIS — Z8719 Personal history of other diseases of the digestive system: Secondary | ICD-10-CM | POA: Diagnosis not present

## 2020-01-30 DIAGNOSIS — Z87891 Personal history of nicotine dependence: Secondary | ICD-10-CM | POA: Diagnosis not present

## 2020-01-30 DIAGNOSIS — Z4659 Encounter for fitting and adjustment of other gastrointestinal appliance and device: Secondary | ICD-10-CM | POA: Diagnosis not present

## 2020-01-30 DIAGNOSIS — Z20822 Contact with and (suspected) exposure to covid-19: Secondary | ICD-10-CM | POA: Diagnosis not present

## 2020-01-30 DIAGNOSIS — Z794 Long term (current) use of insulin: Secondary | ICD-10-CM | POA: Diagnosis not present

## 2020-01-30 DIAGNOSIS — Z9081 Acquired absence of spleen: Secondary | ICD-10-CM | POA: Diagnosis not present

## 2020-01-30 DIAGNOSIS — R188 Other ascites: Secondary | ICD-10-CM | POA: Diagnosis not present

## 2020-01-30 DIAGNOSIS — Z4689 Encounter for fitting and adjustment of other specified devices: Secondary | ICD-10-CM | POA: Diagnosis not present

## 2020-02-01 DIAGNOSIS — D539 Nutritional anemia, unspecified: Secondary | ICD-10-CM | POA: Diagnosis not present

## 2020-02-01 DIAGNOSIS — C8319 Mantle cell lymphoma, extranodal and solid organ sites: Secondary | ICD-10-CM | POA: Diagnosis not present

## 2020-02-01 DIAGNOSIS — C8310 Mantle cell lymphoma, unspecified site: Secondary | ICD-10-CM | POA: Diagnosis not present

## 2020-02-01 DIAGNOSIS — D72828 Other elevated white blood cell count: Secondary | ICD-10-CM | POA: Diagnosis not present

## 2020-02-05 DIAGNOSIS — Z79899 Other long term (current) drug therapy: Secondary | ICD-10-CM | POA: Diagnosis not present

## 2020-02-05 DIAGNOSIS — I82621 Acute embolism and thrombosis of deep veins of right upper extremity: Secondary | ICD-10-CM | POA: Diagnosis not present

## 2020-02-05 DIAGNOSIS — D759 Disease of blood and blood-forming organs, unspecified: Secondary | ICD-10-CM | POA: Diagnosis not present

## 2020-02-05 DIAGNOSIS — Z7901 Long term (current) use of anticoagulants: Secondary | ICD-10-CM | POA: Diagnosis not present

## 2020-02-05 DIAGNOSIS — Z87891 Personal history of nicotine dependence: Secondary | ICD-10-CM | POA: Diagnosis not present

## 2020-02-05 DIAGNOSIS — Z9081 Acquired absence of spleen: Secondary | ICD-10-CM | POA: Diagnosis not present

## 2020-02-05 DIAGNOSIS — C8310 Mantle cell lymphoma, unspecified site: Secondary | ICD-10-CM | POA: Diagnosis not present

## 2020-02-15 DIAGNOSIS — Z794 Long term (current) use of insulin: Secondary | ICD-10-CM | POA: Diagnosis not present

## 2020-02-15 DIAGNOSIS — E1165 Type 2 diabetes mellitus with hyperglycemia: Secondary | ICD-10-CM | POA: Diagnosis not present

## 2020-02-15 DIAGNOSIS — Z7901 Long term (current) use of anticoagulants: Secondary | ICD-10-CM | POA: Diagnosis not present

## 2020-02-15 DIAGNOSIS — C8310 Mantle cell lymphoma, unspecified site: Secondary | ICD-10-CM | POA: Diagnosis not present

## 2020-02-15 DIAGNOSIS — Z79899 Other long term (current) drug therapy: Secondary | ICD-10-CM | POA: Diagnosis not present

## 2020-02-15 DIAGNOSIS — I82621 Acute embolism and thrombosis of deep veins of right upper extremity: Secondary | ICD-10-CM | POA: Diagnosis not present

## 2020-02-16 DIAGNOSIS — C8319 Mantle cell lymphoma, extranodal and solid organ sites: Secondary | ICD-10-CM | POA: Diagnosis not present

## 2020-02-16 DIAGNOSIS — Z5111 Encounter for antineoplastic chemotherapy: Secondary | ICD-10-CM | POA: Diagnosis not present

## 2020-02-23 DIAGNOSIS — Z5111 Encounter for antineoplastic chemotherapy: Secondary | ICD-10-CM | POA: Diagnosis not present

## 2020-02-23 DIAGNOSIS — E860 Dehydration: Secondary | ICD-10-CM | POA: Diagnosis not present

## 2020-02-23 DIAGNOSIS — C8310 Mantle cell lymphoma, unspecified site: Secondary | ICD-10-CM | POA: Diagnosis not present

## 2020-02-23 DIAGNOSIS — Z79899 Other long term (current) drug therapy: Secondary | ICD-10-CM | POA: Diagnosis not present

## 2020-02-23 DIAGNOSIS — E1165 Type 2 diabetes mellitus with hyperglycemia: Secondary | ICD-10-CM | POA: Diagnosis not present

## 2020-02-25 DIAGNOSIS — Z794 Long term (current) use of insulin: Secondary | ICD-10-CM | POA: Diagnosis not present

## 2020-02-25 DIAGNOSIS — E1142 Type 2 diabetes mellitus with diabetic polyneuropathy: Secondary | ICD-10-CM | POA: Diagnosis not present

## 2020-02-27 DIAGNOSIS — R69 Illness, unspecified: Secondary | ICD-10-CM | POA: Diagnosis not present

## 2020-03-01 DIAGNOSIS — C8319 Mantle cell lymphoma, extranodal and solid organ sites: Secondary | ICD-10-CM | POA: Diagnosis not present

## 2020-03-01 DIAGNOSIS — Z5111 Encounter for antineoplastic chemotherapy: Secondary | ICD-10-CM | POA: Diagnosis not present

## 2020-03-08 DIAGNOSIS — C8319 Mantle cell lymphoma, extranodal and solid organ sites: Secondary | ICD-10-CM | POA: Diagnosis not present

## 2020-03-08 DIAGNOSIS — Z5111 Encounter for antineoplastic chemotherapy: Secondary | ICD-10-CM | POA: Diagnosis not present

## 2020-03-14 DIAGNOSIS — C8310 Mantle cell lymphoma, unspecified site: Secondary | ICD-10-CM | POA: Diagnosis not present

## 2020-03-20 DIAGNOSIS — Z794 Long term (current) use of insulin: Secondary | ICD-10-CM | POA: Diagnosis not present

## 2020-03-20 DIAGNOSIS — E782 Mixed hyperlipidemia: Secondary | ICD-10-CM | POA: Diagnosis not present

## 2020-03-20 DIAGNOSIS — E1142 Type 2 diabetes mellitus with diabetic polyneuropathy: Secondary | ICD-10-CM | POA: Diagnosis not present

## 2020-03-20 DIAGNOSIS — I10 Essential (primary) hypertension: Secondary | ICD-10-CM | POA: Diagnosis not present

## 2020-03-20 DIAGNOSIS — E1165 Type 2 diabetes mellitus with hyperglycemia: Secondary | ICD-10-CM | POA: Diagnosis not present

## 2020-03-25 DIAGNOSIS — J9601 Acute respiratory failure with hypoxia: Secondary | ICD-10-CM | POA: Diagnosis not present

## 2020-03-25 DIAGNOSIS — I672 Cerebral atherosclerosis: Secondary | ICD-10-CM | POA: Diagnosis not present

## 2020-03-25 DIAGNOSIS — R918 Other nonspecific abnormal finding of lung field: Secondary | ICD-10-CM | POA: Diagnosis not present

## 2020-03-25 DIAGNOSIS — Z66 Do not resuscitate: Secondary | ICD-10-CM | POA: Diagnosis not present

## 2020-03-25 DIAGNOSIS — Z20822 Contact with and (suspected) exposure to covid-19: Secondary | ICD-10-CM | POA: Diagnosis not present

## 2020-03-25 DIAGNOSIS — A419 Sepsis, unspecified organism: Secondary | ICD-10-CM | POA: Diagnosis not present

## 2020-03-25 DIAGNOSIS — I639 Cerebral infarction, unspecified: Secondary | ICD-10-CM | POA: Diagnosis not present

## 2020-03-25 DIAGNOSIS — I4510 Unspecified right bundle-branch block: Secondary | ICD-10-CM | POA: Diagnosis not present

## 2020-03-25 DIAGNOSIS — I2510 Atherosclerotic heart disease of native coronary artery without angina pectoris: Secondary | ICD-10-CM | POA: Diagnosis not present

## 2020-03-25 DIAGNOSIS — E1165 Type 2 diabetes mellitus with hyperglycemia: Secondary | ICD-10-CM | POA: Diagnosis not present

## 2020-03-25 DIAGNOSIS — R000 Tachycardia, unspecified: Secondary | ICD-10-CM | POA: Diagnosis not present

## 2020-03-25 DIAGNOSIS — G92 Toxic encephalopathy: Secondary | ICD-10-CM | POA: Diagnosis not present

## 2020-03-25 DIAGNOSIS — I4891 Unspecified atrial fibrillation: Secondary | ICD-10-CM | POA: Diagnosis not present

## 2020-03-25 DIAGNOSIS — I6523 Occlusion and stenosis of bilateral carotid arteries: Secondary | ICD-10-CM | POA: Diagnosis not present

## 2020-03-25 DIAGNOSIS — I270 Primary pulmonary hypertension: Secondary | ICD-10-CM | POA: Diagnosis not present

## 2020-03-25 DIAGNOSIS — N179 Acute kidney failure, unspecified: Secondary | ICD-10-CM | POA: Diagnosis not present

## 2020-03-25 DIAGNOSIS — R531 Weakness: Secondary | ICD-10-CM | POA: Diagnosis not present

## 2020-03-25 DIAGNOSIS — E1010 Type 1 diabetes mellitus with ketoacidosis without coma: Secondary | ICD-10-CM | POA: Diagnosis not present

## 2020-03-25 DIAGNOSIS — R231 Pallor: Secondary | ICD-10-CM | POA: Diagnosis not present

## 2020-03-25 DIAGNOSIS — C8590 Non-Hodgkin lymphoma, unspecified, unspecified site: Secondary | ICD-10-CM | POA: Diagnosis not present

## 2020-03-25 DIAGNOSIS — R110 Nausea: Secondary | ICD-10-CM | POA: Diagnosis not present

## 2020-03-25 DIAGNOSIS — J189 Pneumonia, unspecified organism: Secondary | ICD-10-CM | POA: Diagnosis not present

## 2020-03-25 DIAGNOSIS — I517 Cardiomegaly: Secondary | ICD-10-CM | POA: Diagnosis not present

## 2020-03-25 DIAGNOSIS — I6782 Cerebral ischemia: Secondary | ICD-10-CM | POA: Diagnosis not present

## 2020-03-25 DIAGNOSIS — Z515 Encounter for palliative care: Secondary | ICD-10-CM | POA: Diagnosis not present

## 2020-03-25 DIAGNOSIS — I491 Atrial premature depolarization: Secondary | ICD-10-CM | POA: Diagnosis not present

## 2020-03-25 DIAGNOSIS — I700 Atherosclerosis of aorta: Secondary | ICD-10-CM | POA: Diagnosis not present

## 2020-03-25 DIAGNOSIS — R0689 Other abnormalities of breathing: Secondary | ICD-10-CM | POA: Diagnosis not present

## 2020-04-10 DEATH — deceased
